# Patient Record
Sex: Female | Born: 1945
Health system: Southern US, Community
[De-identification: ages and names within clinical notes are randomized; demographics above are authoritative.]

## PROBLEM LIST (undated history)

## (undated) DIAGNOSIS — R002 Palpitations: Secondary | ICD-10-CM

## (undated) DIAGNOSIS — I2510 Atherosclerotic heart disease of native coronary artery without angina pectoris: Secondary | ICD-10-CM

## (undated) DIAGNOSIS — I10 Essential (primary) hypertension: Secondary | ICD-10-CM

## (undated) DIAGNOSIS — K649 Unspecified hemorrhoids: Secondary | ICD-10-CM

## (undated) DIAGNOSIS — K5790 Diverticulosis of intestine, part unspecified, without perforation or abscess without bleeding: Secondary | ICD-10-CM

## (undated) DIAGNOSIS — R011 Cardiac murmur, unspecified: Secondary | ICD-10-CM

## (undated) DIAGNOSIS — F329 Major depressive disorder, single episode, unspecified: Secondary | ICD-10-CM

## (undated) DIAGNOSIS — F32A Depression, unspecified: Secondary | ICD-10-CM

## (undated) DIAGNOSIS — K5909 Other constipation: Secondary | ICD-10-CM

## (undated) DIAGNOSIS — F419 Anxiety disorder, unspecified: Secondary | ICD-10-CM

## (undated) DIAGNOSIS — C50919 Malignant neoplasm of unspecified site of unspecified female breast: Secondary | ICD-10-CM

## (undated) DIAGNOSIS — E785 Hyperlipidemia, unspecified: Secondary | ICD-10-CM

## (undated) DIAGNOSIS — I6521 Occlusion and stenosis of right carotid artery: Secondary | ICD-10-CM

## (undated) DIAGNOSIS — R55 Syncope and collapse: Secondary | ICD-10-CM

## (undated) DIAGNOSIS — J45909 Unspecified asthma, uncomplicated: Secondary | ICD-10-CM

## (undated) DIAGNOSIS — Z923 Personal history of irradiation: Secondary | ICD-10-CM

## (undated) HISTORY — PX: KNEE ARTHROSCOPY: SUR90

## (undated) HISTORY — DX: Essential (primary) hypertension: I10

## (undated) HISTORY — DX: Syncope and collapse: R55

## (undated) HISTORY — DX: Malignant neoplasm of unspecified site of unspecified female breast: C50.919

## (undated) HISTORY — PX: CHOLECYSTECTOMY: SHX55

## (undated) HISTORY — PX: ABDOMINAL HYSTERECTOMY: SHX81

## (undated) HISTORY — DX: Palpitations: R00.2

## (undated) HISTORY — DX: Hyperlipidemia, unspecified: E78.5

## (undated) HISTORY — DX: Major depressive disorder, single episode, unspecified: F32.9

## (undated) HISTORY — DX: Depression, unspecified: F32.A

## (undated) HISTORY — PX: MASTECTOMY: SHX3

## (undated) HISTORY — PX: TUBAL LIGATION: SHX77

## (undated) HISTORY — DX: Atherosclerotic heart disease of native coronary artery without angina pectoris: I25.10

## (undated) HISTORY — DX: Cardiac murmur, unspecified: R01.1

## (undated) HISTORY — DX: Anxiety disorder, unspecified: F41.9

---

## 1998-01-04 ENCOUNTER — Other Ambulatory Visit: Admission: RE | Admit: 1998-01-04 | Discharge: 1998-01-04 | Payer: Self-pay | Admitting: *Deleted

## 1999-01-20 ENCOUNTER — Other Ambulatory Visit: Admission: RE | Admit: 1999-01-20 | Discharge: 1999-01-20 | Payer: Self-pay | Admitting: *Deleted

## 2000-01-21 ENCOUNTER — Other Ambulatory Visit: Admission: RE | Admit: 2000-01-21 | Discharge: 2000-01-21 | Payer: Self-pay | Admitting: *Deleted

## 2000-03-01 ENCOUNTER — Encounter: Admission: RE | Admit: 2000-03-01 | Discharge: 2000-03-01 | Payer: Self-pay | Admitting: *Deleted

## 2000-03-01 ENCOUNTER — Encounter: Payer: Self-pay | Admitting: *Deleted

## 2001-03-07 ENCOUNTER — Encounter: Admission: RE | Admit: 2001-03-07 | Discharge: 2001-03-07 | Payer: Self-pay | Admitting: *Deleted

## 2001-03-07 ENCOUNTER — Encounter: Payer: Self-pay | Admitting: *Deleted

## 2004-05-07 ENCOUNTER — Encounter: Admission: RE | Admit: 2004-05-07 | Discharge: 2004-05-07 | Payer: Self-pay | Admitting: Family Medicine

## 2005-09-16 ENCOUNTER — Encounter: Admission: RE | Admit: 2005-09-16 | Discharge: 2005-09-16 | Payer: Self-pay | Admitting: Family Medicine

## 2007-01-20 ENCOUNTER — Encounter: Admission: RE | Admit: 2007-01-20 | Discharge: 2007-01-20 | Payer: Self-pay | Admitting: Family Medicine

## 2007-05-15 ENCOUNTER — Emergency Department: Payer: Self-pay | Admitting: Emergency Medicine

## 2008-02-07 ENCOUNTER — Encounter: Admission: RE | Admit: 2008-02-07 | Discharge: 2008-02-07 | Payer: Self-pay | Admitting: Family Medicine

## 2008-02-15 ENCOUNTER — Encounter: Admission: RE | Admit: 2008-02-15 | Discharge: 2008-02-15 | Payer: Self-pay | Admitting: Family Medicine

## 2008-08-13 ENCOUNTER — Encounter: Admission: RE | Admit: 2008-08-13 | Discharge: 2008-08-13 | Payer: Self-pay | Admitting: Family Medicine

## 2009-02-11 ENCOUNTER — Encounter: Admission: RE | Admit: 2009-02-11 | Discharge: 2009-02-11 | Payer: Self-pay | Admitting: Internal Medicine

## 2009-07-05 ENCOUNTER — Encounter: Payer: Self-pay | Admitting: Gastroenterology

## 2009-07-16 ENCOUNTER — Encounter: Admission: RE | Admit: 2009-07-16 | Discharge: 2009-07-16 | Payer: Self-pay | Admitting: Internal Medicine

## 2009-12-19 ENCOUNTER — Telehealth (INDEPENDENT_AMBULATORY_CARE_PROVIDER_SITE_OTHER): Payer: Self-pay | Admitting: *Deleted

## 2010-02-28 ENCOUNTER — Telehealth (INDEPENDENT_AMBULATORY_CARE_PROVIDER_SITE_OTHER): Payer: Self-pay | Admitting: *Deleted

## 2010-03-10 ENCOUNTER — Telehealth: Payer: Self-pay | Admitting: Gastroenterology

## 2010-03-10 ENCOUNTER — Encounter (INDEPENDENT_AMBULATORY_CARE_PROVIDER_SITE_OTHER): Payer: Self-pay | Admitting: *Deleted

## 2010-05-04 ENCOUNTER — Encounter: Payer: Self-pay | Admitting: Internal Medicine

## 2010-05-05 ENCOUNTER — Encounter: Payer: Self-pay | Admitting: Internal Medicine

## 2010-05-05 ENCOUNTER — Encounter (INDEPENDENT_AMBULATORY_CARE_PROVIDER_SITE_OTHER): Payer: Self-pay

## 2010-05-07 ENCOUNTER — Telehealth: Payer: Self-pay | Admitting: Gastroenterology

## 2010-05-07 ENCOUNTER — Ambulatory Visit
Admission: RE | Admit: 2010-05-07 | Discharge: 2010-05-07 | Payer: Self-pay | Source: Home / Self Care | Attending: Gastroenterology | Admitting: Gastroenterology

## 2010-05-13 NOTE — Letter (Signed)
Summary: Pre Visit Letter Revised  [facility]  [address] [place], [place] [address]   Phone: [phone]  Fax: [phone]        03/10/2010 MRN: [id number] [facility] [address] [place], [place]  [address]             Procedure Date:  04/23/2010   Welcome to the Gastroenterology Division at [facility].    You are scheduled to see a nurse for your pre-procedure visit on 04/08/2010 at  8:30am on the 3rd floor at [facility], [address]  We ask that you try to arrive at our office 15 minutes prior to your appointment time to allow for check-in.  Please take a minute to review the attached form.  If you answer "Yes" to one or more of the questions on the first page, we ask that you call the person listed at your earliest opportunity.  If you answer "No" to all of the questions, please complete the rest of the form and bring it to your appointment.    Your nurse visit will consist of discussing your medical and surgical history, your immediate family medical history, and your medications.   If you are unable to list all of your medications on the form, please bring the medication bottles to your appointment and we will list them.  We will need to be aware of both prescribed and over the counter drugs.  We will need to know exact dosage information as well.    Please be prepared to read and sign documents such as consent forms, a financial agreement, and acknowledgement forms.  If necessary, and with your consent, a friend or relative is welcome to sit-in on the nurse visit with you.  Please bring your insurance card so that we may make a copy of it.  If your insurance requires a referral to see a specialist, please bring your referral form from your primary care physician.  No co-pay is required for this nurse visit.     If you cannot keep your appointment, please call [phone] to cancel or reschedule prior to your appointment date.  This  allows [place] the opportunity to schedule an appointment for another patient in need of care.    Thank you for choosing [facility] for your medical needs.  We appreciate the opportunity to care for you.  Please visit [place] at our website  to learn more about our practice.  Sincerely, The Gastroenterology Division

## 2010-05-13 NOTE — Procedures (Signed)
Summary: [facility]  [facility]   Imported By: Lester [place] 03/14/2010 07:56:13  _____________________________________________________________________  External Attachment:    Type:   Image     Comment:   External Document

## 2010-05-13 NOTE — Progress Notes (Signed)
Summary: Patient wants to Change GI  Phone Note From Other Clinic   Caller: Malachi Bonds [phone] Summary of Call: Malachi Bonds faxed records from [facility], I called to find out why we recieved the records and she states that the patient had a attempted Colonoscopy at [facility] in May 2011 and they asked her to go back so they could attempt again and patient declined. Malachi Bonds said the patient "did not get enough Versed and felt everything" so she refused to go back there. I will hold on to the [facility] notes but advised Malachi Bonds that we can not make the patient an appt to see Dr. Jarold Motto until he has ALL of the [facility] records for review and once he has done that someone will call her with his decision to accept or decline patient. Malachi Bonds states that she will work on getting Dr. Jarold Motto the [place] records tomorrow.  Initial call taken by: Harlow Mares CMA Duncan Dull),  December 19, 2009 4:27 PM     Appended Document: Patient wants to Change GI I placed the records for [facility] in the trash it has been almost a month and she has never faxed the [facility] Records to our office.

## 2010-05-13 NOTE — Progress Notes (Signed)
  Phone Note Other Incoming   Request: Send information Summary of Call: Request received from [facility] (2 pages) forwarded to Dr. Jarold Motto.

## 2010-05-13 NOTE — Progress Notes (Signed)
Summary: Schedule Colonoscopy with [facility]  Phone Note Outgoing Call Call back at Home Phone [phone]   Call placed by: Harlow Mares CMA Duncan Dull),  March 10, 2010 4:56 PM Call placed to: Patient Summary of Call: called pt and per Dr. Jarold Motto patient is ok to schedule colon with [facility] i have scheduled her for 04/23/2010 and I have sent her records to medical records to be scanned in for our keeping, she had a incomplete colonoscopy 07/05/2009 with [facility] phone number [phone]  Initial call taken by: Harlow Mares CMA Duncan Dull),  March 10, 2010 4:57 PM

## 2010-05-14 HISTORY — PX: COLONOSCOPY: SHX174

## 2010-05-15 NOTE — Progress Notes (Signed)
Summary: prep not in pharmacy  Phone Note From Pharmacy   Caller: [facility]* Call For: Dr Jarold Motto  Summary of Call: pt is at the pharmacy to pick up her prep but it's not there, please resend it to [facility] Initial call taken by: Tawni Levy,  May 07, 2010 2:48 PM    New/Updated Medications: MOVIPREP 100 GM  SOLR (PEG-KCL-NACL-NASULF-NA ASC-C) As per prep instructions. Prescriptions: MOVIPREP 100 GM  SOLR (PEG-KCL-NACL-NASULF-NA ASC-C) As per prep instructions.  #1 x 0   Entered by:   Wyona Almas RN   Authorized by:   Mardella Layman MD [facility]   Signed by:   Wyona Almas RN on 05/07/2010   Method used:   Electronically to        [facility]* (retail)       [address]       [place], [place]  [address]       Ph: [phone]       Fax: [phone]   RxID:   [id number]

## 2010-05-15 NOTE — Miscellaneous (Signed)
Summary:  [facility]  Clinical Lists Changes  Medications: Added new medication of MOVIPREP 100 GM  SOLR (PEG-KCL-NACL-NASULF-NA ASC-C) Allergies: Added new allergy or adverse reaction of PCN Added new allergy or adverse reaction of CEFTIN Added new allergy or adverse reaction of SULFA Added new allergy or adverse reaction of ERYTHROMYCIN Added new allergy or adverse reaction of TETRACYCLINE Added new allergy or adverse reaction of DARVOCET Added new allergy or adverse reaction of MACRODANTIN Added new allergy or adverse reaction of FLOXIN Added new allergy or adverse reaction of CODEINE Observations: Added new observation of ALLERGY REV: Done (05/07/2010 8:57) Added new observation of NKA: F (05/07/2010 8:57)

## 2010-05-15 NOTE — Letter (Signed)
Summary: [facility] Instructions  [facility]  [address] [place], [place] [address]   Phone: [phone]  Fax: [phone]       Jamie Curry    10-22-1945    MRN: [id number]        Procedure Day Dorna Bloom:  Wednesday 05/21/2010     Arrival Time: 2:30 pm     Procedure Time: 3:30 pm     Location of Procedure:                    _x _  [facility] (4th Floor)                        PREPARATION FOR COLONOSCOPY WITH MOVIPREP   Starting 5 days prior to your procedure Friday 2/3 do not eat nuts, seeds, popcorn, corn, beans, peas,  salads, or any raw vegetables.  Do not take any fiber supplements (e.g. Metamucil, Citrucel, and Benefiber).  THE DAY BEFORE YOUR PROCEDURE         DATE: Tuesday 2/7  1.  Drink clear liquids the entire day-NO SOLID FOOD  2.  Do not drink anything colored red or purple.  Avoid juices with pulp.  No orange juice.  3.  Drink at least 64 oz. (8 glasses) of fluid/clear liquids during the day to prevent dehydration and help the prep work efficiently.  CLEAR LIQUIDS INCLUDE: Water Jello Ice Popsicles Tea (sugar ok, no milk/cream) Powdered fruit flavored drinks Coffee (sugar ok, no milk/cream) Gatorade Juice: apple, white grape, white cranberry  Lemonade Clear bullion, consomm, broth Carbonated beverages (any kind) Strained chicken noodle soup Hard Candy                             4.  In the morning, mix first dose of MoviPrep solution:    Empty 1 Pouch A and 1 Pouch B into the disposable container    Add lukewarm drinking water to the top line of the container. Mix to dissolve    Refrigerate (mixed solution should be used within 24 hrs)  5.  Begin drinking the prep at 5:00 p.m. The MoviPrep container is divided by 4 marks.   Every 15 minutes drink the solution down to the next mark (approximately 8 oz) until the full liter is complete.   6.  Follow completed prep with 16 oz of clear liquid of your choice (Nothing red  or purple).  Continue to drink clear liquids until bedtime.  7.  Before going to bed, mix second dose of MoviPrep solution:    Empty 1 Pouch A and 1 Pouch B into the disposable container    Add lukewarm drinking water to the top line of the container. Mix to dissolve    Refrigerate  THE DAY OF YOUR PROCEDURE      DATE: Wednesday 2/8  Beginning at 10:30 a.m. (5 hours before procedure):         1. Every 15 minutes, drink the solution down to the next mark (approx 8 oz) until the full liter is complete.  2. Follow completed prep with 16 oz. of clear liquid of your choice.    3. You may drink clear liquids until 1:30 pm (2 HOURS BEFORE PROCEDURE).   MEDICATION INSTRUCTIONS  Unless otherwise instructed, you should take regular prescription medications with a small sip of water   as early as possible the morning of your  procedure.           OTHER INSTRUCTIONS  You will need a responsible adult at least 66 years of age to accompany you and drive you home.   This person must remain in the waiting room during your procedure.  Wear loose fitting clothing that is easily removed.  Leave jewelry and other valuables at home.  However, you may wish to bring a book to read or  an iPod/MP3 player to listen to music as you wait for your procedure to start.  Remove all body piercing jewelry and leave at home.  Total time from sign-in until discharge is approximately 2-3 hours.  You should go home directly after your procedure and rest.  You can resume normal activities the  day after your procedure.  The day of your procedure you should not:   Drive   Make legal decisions   Operate machinery   Drink alcohol   Return to work  You will receive specific instructions about eating, activities and medications before you leave.    The above instructions have been reviewed and explained to me by   Doristine Church RN II  May 07, 2010 9:43 AM    I fully understand and can  verbalize these instructions _____________________________ Date _________

## 2010-05-21 ENCOUNTER — Encounter (AMBULATORY_SURGERY_CENTER): Payer: Medicare Other | Admitting: Gastroenterology

## 2010-05-21 ENCOUNTER — Other Ambulatory Visit: Payer: Self-pay | Admitting: Gastroenterology

## 2010-05-21 ENCOUNTER — Encounter: Payer: Self-pay | Admitting: Gastroenterology

## 2010-05-21 DIAGNOSIS — Z1211 Encounter for screening for malignant neoplasm of colon: Secondary | ICD-10-CM

## 2010-05-21 DIAGNOSIS — Z800 Family history of malignant neoplasm of digestive organs: Secondary | ICD-10-CM

## 2010-05-21 DIAGNOSIS — K5730 Diverticulosis of large intestine without perforation or abscess without bleeding: Secondary | ICD-10-CM

## 2010-05-29 NOTE — Procedures (Signed)
Summary: Colonoscopy   Colonoscopy  Procedure date:  05/21/2010  Findings:      Location:  [facility].    Procedures Next Due Date:    Colonoscopy: 05/2015 COLONOSCOPY PROCEDURE REPORT  PATIENT:  Jamie Curry, Jamie Curry  MR#:  [id number] BIRTHDATE:   August 04, 1945, 64 yrs. old   GENDER:   female ENDOSCOPIST:   Vania Rea. Jarold Motto, MD, [facility] REF. BY: Kari Baars, M.D. PROCEDURE DATE:  05/21/2010 PROCEDURE:  Higher-risk screening colonoscopy G0105   ASA CLASS:   Class II INDICATIONS: family history of colon cancer sister MEDICATIONS:    propofol (Diprivan) 200 mg IV  DESCRIPTION OF PROCEDURE:   After the risks benefits and alternatives of the procedure were thoroughly explained, informed consent was obtained.  Digital rectal exam was performed and revealed no abnormalities.   The LB 180AL [id number] endoscope was introduced through the anus and advanced to the cecum, which was identified by both the appendix and ileocecal valve, without limitations.  The quality of the prep was excellent, using MoviPrep.  The instrument was then slowly withdrawn as the colon was fully examined. <<PROCEDUREIMAGES>>        <<OLD IMAGES>>  FINDINGS:  Moderate diverticulosis was found in the sigmoid to descending colon segments.  Otherwise normal colonoscopy without other polyps, masses, vascular ectasias, or inflammatory changes.   Retroflexed views in the rectum revealed it was not tolerated by the patient. and no abnormalities.    The scope was then withdrawn from the patient and the procedure completed.  COMPLICATIONS:   None ENDOSCOPIC IMPRESSION:  1) Moderate diverticulosis in the sigmoid to descending colon segments  2) No polyps or cancers RECOMMENDATIONS:  1) high fiber diet  2) Given your significant family history of colon cancer, you should have a repeat colonoscopy in 5 years REPEAT EXAM:   No   _______________________________ Vania Rea. Jarold Motto, MD, [facility]  CC:

## 2010-06-18 ENCOUNTER — Inpatient Hospital Stay (HOSPITAL_COMMUNITY)
Admission: EM | Admit: 2010-06-18 | Discharge: 2010-06-20 | DRG: 313 | Disposition: A | Discharge status: Home / Self Care | Payer: Medicare Other | Attending: Internal Medicine | Admitting: Internal Medicine

## 2010-06-18 ENCOUNTER — Emergency Department (HOSPITAL_COMMUNITY): Payer: Medicare Other

## 2010-06-18 DIAGNOSIS — I498 Other specified cardiac arrhythmias: Secondary | ICD-10-CM | POA: Diagnosis present

## 2010-06-18 DIAGNOSIS — I951 Orthostatic hypotension: Secondary | ICD-10-CM | POA: Diagnosis present

## 2010-06-18 DIAGNOSIS — R079 Chest pain, unspecified: Principal | ICD-10-CM | POA: Diagnosis present

## 2010-06-18 DIAGNOSIS — E785 Hyperlipidemia, unspecified: Secondary | ICD-10-CM | POA: Diagnosis present

## 2010-06-18 DIAGNOSIS — E669 Obesity, unspecified: Secondary | ICD-10-CM | POA: Diagnosis present

## 2010-06-18 DIAGNOSIS — J45909 Unspecified asthma, uncomplicated: Secondary | ICD-10-CM | POA: Diagnosis present

## 2010-06-18 DIAGNOSIS — I10 Essential (primary) hypertension: Secondary | ICD-10-CM | POA: Diagnosis present

## 2010-06-18 DIAGNOSIS — Z882 Allergy status to sulfonamides status: Secondary | ICD-10-CM

## 2010-06-18 DIAGNOSIS — Z880 Allergy status to penicillin: Secondary | ICD-10-CM

## 2010-06-18 DIAGNOSIS — I2510 Atherosclerotic heart disease of native coronary artery without angina pectoris: Secondary | ICD-10-CM | POA: Diagnosis present

## 2010-06-18 DIAGNOSIS — F341 Dysthymic disorder: Secondary | ICD-10-CM | POA: Diagnosis present

## 2010-06-18 DIAGNOSIS — D649 Anemia, unspecified: Secondary | ICD-10-CM | POA: Diagnosis present

## 2010-06-18 LAB — COMPREHENSIVE METABOLIC PANEL
ALT: 17 U/L (ref 0–35)
BUN: 17 mg/dL (ref 6–23)
CO2: 26 mEq/L (ref 19–32)
Calcium: 9.5 mg/dL (ref 8.4–10.5)
Chloride: 105 mEq/L (ref 96–112)
Creatinine, Ser: 1.24 mg/dL — ABNORMAL HIGH (ref 0.4–1.2)
GFR calc Af Amer: 53 mL/min — ABNORMAL LOW (ref >60)
GFR calc non Af Amer: 43 mL/min — ABNORMAL LOW (ref >60)
Potassium: 4.3 mEq/L (ref 3.5–5.1)
Total Bilirubin: 0.7 mg/dL (ref 0.3–1.2)
Total Protein: 7.1 g/dL (ref 6.0–8.3)

## 2010-06-18 LAB — POCT CARDIAC MARKERS
Myoglobin, poc: 92.6 ng/mL (ref 12–200)
Myoglobin, poc: 110 ng/mL (ref 12–200)
Troponin i, poc: <0.05 ng/mL (ref 0.00–0.09)

## 2010-06-18 LAB — GLUCOSE, CAPILLARY

## 2010-06-18 LAB — POCT I-STAT, CHEM 8
Calcium, Ion: 1.03 mmol/L — ABNORMAL LOW (ref 1.12–1.32)
Chloride: 109 mEq/L (ref 96–112)
Hemoglobin: 14.6 g/dL (ref 12.0–15.0)
Potassium: 4.2 mEq/L (ref 3.5–5.1)

## 2010-06-18 LAB — CBC
Hemoglobin: 13.7 g/dL (ref 12.0–15.0)
MCH: 30.4 pg (ref 26.0–34.0)
RBC: 4.5 MIL/uL (ref 3.87–5.11)
RDW: 13.7 % (ref 11.5–15.5)
WBC: 11.4 10*3/uL — ABNORMAL HIGH (ref 4.0–10.5)

## 2010-06-18 LAB — DIFFERENTIAL
Basophils Relative: 1 % (ref 0–1)
Eosinophils Absolute: 0.2 10*3/uL (ref 0.0–0.7)
Eosinophils Relative: 1 % (ref 0–5)
Lymphs Abs: 2.2 10*3/uL (ref 0.7–4.0)
Monocytes Absolute: 0.6 10*3/uL (ref 0.1–1.0)
Monocytes Relative: 5 % (ref 3–12)
Neutrophils Relative %: 74 % (ref 43–77)

## 2010-06-18 LAB — CK TOTAL AND CKMB (NOT AT ARMC): CK, MB: 1.7 ng/mL (ref 0.3–4.0)

## 2010-06-18 LAB — PROTIME-INR: INR: 0.93 (ref 0.00–1.49)

## 2010-06-18 LAB — TROPONIN I: Troponin I: 0.02 ng/mL (ref 0.00–0.06)

## 2010-06-19 DIAGNOSIS — I517 Cardiomegaly: Secondary | ICD-10-CM

## 2010-06-19 DIAGNOSIS — R55 Syncope and collapse: Secondary | ICD-10-CM

## 2010-06-19 LAB — LIPID PANEL
Cholesterol: 236 mg/dL — ABNORMAL HIGH (ref 0–200)
Total CHOL/HDL Ratio: 3.4 RATIO
VLDL: 34 mg/dL (ref 0–40)

## 2010-06-19 LAB — CARDIAC PANEL(CRET KIN+CKTOT+MB+TROPI)
CK, MB: 1.3 ng/mL (ref 0.3–4.0)
Relative Index: INVALID (ref 0.0–2.5)
Relative Index: INVALID (ref 0.0–2.5)
Total CK: 51 U/L (ref 7–177)
Troponin I: 0.03 ng/mL (ref 0.00–0.06)
Troponin I: <0.01 ng/mL (ref 0.00–0.06)

## 2010-06-19 LAB — BASIC METABOLIC PANEL
Calcium: 8.6 mg/dL (ref 8.4–10.5)
Chloride: 101 mEq/L (ref 96–112)
Creatinine, Ser: 1.09 mg/dL (ref 0.4–1.2)
GFR calc Af Amer: >60 mL/min (ref >60)

## 2010-06-19 LAB — HEMOGLOBIN A1C: Mean Plasma Glucose: 126 mg/dL — ABNORMAL HIGH (ref <117)

## 2010-06-19 LAB — CBC
Hemoglobin: 12.1 g/dL (ref 12.0–15.0)
RBC: 3.94 MIL/uL (ref 3.87–5.11)
WBC: 9.1 10*3/uL (ref 4.0–10.5)

## 2010-06-19 LAB — GLUCOSE, CAPILLARY: Glucose-Capillary: 144 mg/dL — ABNORMAL HIGH (ref 70–99)

## 2010-06-19 LAB — TSH: TSH: 3.125 u[IU]/mL (ref 0.350–4.500)

## 2010-06-20 ENCOUNTER — Inpatient Hospital Stay (HOSPITAL_COMMUNITY): Payer: Medicare Other

## 2010-06-20 LAB — BASIC METABOLIC PANEL
BUN: 9 mg/dL (ref 6–23)
CO2: 22 mEq/L (ref 19–32)
Calcium: 8 mg/dL — ABNORMAL LOW (ref 8.4–10.5)
Creatinine, Ser: 0.75 mg/dL (ref 0.4–1.2)
GFR calc Af Amer: >60 mL/min (ref >60)

## 2010-06-20 LAB — CBC
MCH: 30 pg (ref 26.0–34.0)
MCH: 30.3 pg (ref 26.0–34.0)
MCHC: 32.5 g/dL (ref 30.0–36.0)
Platelets: 189 10*3/uL (ref 150–400)
Platelets: 206 10*3/uL (ref 150–400)
RBC: 3.63 MIL/uL — ABNORMAL LOW (ref 3.87–5.11)
RDW: 13.8 % (ref 11.5–15.5)
WBC: 6.4 10*3/uL (ref 4.0–10.5)

## 2010-06-20 LAB — IRON AND TIBC
Iron: 34 ug/dL — ABNORMAL LOW (ref 42–135)
UIBC: 254 ug/dL

## 2010-06-20 MED ORDER — TECHNETIUM TC 99M TETROFOSMIN IV KIT
10.0000 | PACK | Freq: Once | INTRAVENOUS | Status: AC | PRN
  Administered 2010-06-20: 10 via INTRAVENOUS

## 2010-06-20 MED ORDER — TECHNETIUM TC 99M TETROFOSMIN IV KIT
30.0000 | PACK | Freq: Once | INTRAVENOUS | Status: AC | PRN
  Administered 2010-06-20: 30 via INTRAVENOUS

## 2010-06-21 ENCOUNTER — Other Ambulatory Visit (HOSPITAL_COMMUNITY): Payer: Medicare Other

## 2010-07-07 NOTE — Consult Note (Signed)
NAMEROSALAND, SHIFFMAN                ACCOUNT NO.:  [id number]  MEDICAL RECORD NO.:  [id number]           PATIENT TYPE:  I  LOCATION:  2038                         FACILITY:  [facility]  PHYSICIAN:  Cassell Clement, M.D. DATE OF BIRTH:  [birth year]  DATE OF CONSULTATION:  06/18/2010 DATE OF DISCHARGE:                                CONSULTATION   PRIMARY CARDIOLOGIST:  The patient reports Dr. Elease Hashimoto.  PATIENT PROFILE:  This is a 65 year old female with prior history of nonobstructive CAD who presents with chest tightness, slurred speech, and left arm numbness as well as nausea and vomiting.  PROBLEM LIST: 1. Chest pain.     a.     The patient has a history of catheterization in 2001 at      [facility] showing nonobstructive disease.     b.     Postcatheterization, the patient has a large hematoma      keeping her out of work for approximately 6 weeks. 2. Hypertension. 3. Hyperlipidemia. 4. Asthma. 5. Left-sided numbness and slurred speech today with negative head CT     in the ER. 6. Depression. 7. Anxiety. 8. History of cardiac murmur. 9. Obesity. 10.Status post partial hysterectomy secondary to uterine fibroids in     1982. 11.Status post cholecystectomy in 1996.  ALLERGIES: 1. ZITHROMAX. 2. BIAXIN. 3. CEFTIN. 4. PENICILLIN. 5. TETRACYCLINE. 6. SULFA. 7. CODEINE plus report of 3 additional antibiotics that the patient     does not know spelling.  HISTORY OF PRESENT ILLNESS:  This is a 65 year old female with history of nonobstructive CAD by catheterization in 2001 performed at [facility]. Last week, she noted feeling fullness in her head with elevated blood pressures in 140s-150s.  She saw Dr. Clelia Croft earlier this week and was noted to be bradycardic with rates in the 40s.  Her beta-blocker was discontinued and she was placed on lisinopril 10 mg daily.  Since the switch, blood pressure has been well controlled in the 110-120 range. This morning, she felt lightheaded all  morning long and while fixing lunch she became acutely presyncopal, diaphoretic, and also noted substernal chest tightness and dyspnea.  She called her primary care provider and was noted to have slurring of her speech.  She also developed left arm numbness and weakness.  She then called 911.  When EMS arrived, she was treated with supple nitroglycerin and aspirin with almost complete relief of chest pain and dyspnea, although hand numbness persisted.  She was taken to [facility] ED and was initially called as a code stroke.  Head CT showed no acute findings and all neurologic symptoms resolved.  Since being here, she has been noted to be in sinus rhythm on the monitor and ECG shows no acute changes.  Point-of-care markers negative x2.  She has had recurrent nausea with diaphoresis and vomiting, though this occurred while she was in the bathroom and also monitor.  Currently, she has no complaints.  CURRENT MEDICATIONS: 1. Aspirin 325 mg daily. 2. Lovenox 40 mg daily. 3. Lovastatin 10 mg nightly. 4. Zoloft 50 mg daily. 5. Lisinopril 10 mg daily  FAMILY HISTORY:  Mother died of an MI in her 75s, she was also diabetic. Father died at 66 with cancer.  She has a sister with colon cancer and lymphoma.  Brother died with intracranial hemorrhage at 84 and another brother who died with history of diabetes at 76.  Sister died 51 of cirrhosis and another brother with atrial fibrillation.  SOCIAL HISTORY:  The patient lives in [place] with her husband.  She is retired.  She denies tobacco, alcohol, or drug use.  She is not routinely exercising.  REVIEW OF SYSTEMS:  Positive for chest pain, dyspnea, presyncope, nausea, vomiting, and diaphoresis.  She is a full code.  Otherwise, all system are reviewed and are negative.  PHYSICAL EXAMINATION:  VITAL SIGNS:  Temperature 97.2, heart rate 84, respirations 16, blood pressure 101/58, and pulse ox 98% on room air. GENERAL:  A pleasant white  female in no acute distress.  Awake, alert, and oriented x3.  She has a  normal affect. HEENT:  Normal. NEURO:  Grossly intact and nonfocal. SKIN:  Warm and dry without lesions or masses. NECK:  Supple and obese, difficult to assess JVP.  There is question of bruit on the left versus radiated murmur. LUNGS:  Respirations are regular and unlabored.  Clear to auscultation. CARDIAC:  Regular S1 and S2 with 2/6 systolic murmur at the right upper sternal border. ABDOMEN:  Obese and soft.  Diffusely tender.  Bowel sounds present x4. EXTREMITIES:  Warm, dry, and pink.  No clubbing, cyanosis, or edema. Dorsalis pedis and posterior tibial pulses are 2+ and equal bilaterally.  Head CT showed no acute findings.  EKG shows sinus rhythm, rate of 73, likely left atrial enlargement, lateral T-flattening.  Hemoglobin 13.7, hematocrit 41.4, WBC 11.4, and platelets 268.  Sodium 136, potassium 4.2, chloride 109, CO2 of 21, BUN 21, creatinine 1.3, and glucose 117. CK-MB 1.4 and troponin I less than 0.5.  INR 0.93+.  ASSESSMENT AND PLAN: 1. Chest pain:  The patient without objective evidence of ischemia at     this time.  I agree with cycling of enzymes and evaluation of 2-D     echocardiogram.  Risk factors include hypertension, hyperlipidemia,     and obesity.  The patient also has reported history of     nonobstructive disease in 2001.  If enzymes negative, we would     probably plan on inpatient versus outpatient Myoview.     Understandably, the patient prefers to avoid catheterization given     prior history of groin bleeding and significant disability as     result of that for about 6 weeks.. 2. Nausea, vomiting, and weakness:  There is some question as whether     or not the bradycardia was playing a role in her weakness earlier     this week.  She is no longer bradycardic.  Continue to follow on     the monitor. 3. Bradycardia:  As above. 4. Hypertension:  Stable. 5. Systolic murmur:  Followup  2-D echocardiogram.     Nicolasa Ducking, ANP   ______________________________ Cassell Clement, M.D.    CB/MEDQ  D:  06/18/2010  T:  06/19/2010  Job:  [id number]  Electronically Signed by Nicolasa Ducking ANP on 06/30/2010 12:04:45 PM Electronically Signed by Cassell Clement M.D. on 07/07/2010 12:29:51 PM

## 2010-07-30 NOTE — H&P (Signed)
Jamie Curry, Jamie Curry                ACCOUNT NO.:  [id number]  MEDICAL RECORD NO.:  [id number]           PATIENT TYPE:  I  LOCATION:  2038                         FACILITY:  [facility]  PHYSICIAN:  Kari Baars, M.D.  DATE OF BIRTH:  23-Feb-1946  DATE OF ADMISSION:  06/18/2010 DATE OF DISCHARGE:                             HISTORY & PHYSICAL   CHIEF COMPLAINT:  Chest discomfort and left arm weakness.  HISTORY OF PRESENT ILLNESS:  Jamie Curry is a 65 year old white female with a history of hypertension, hyperlipidemia, and nonobstructive coronary artery disease, who presented to the emergency department with complaint of chest discomfort and left arm weakness.  She states her symptoms began around 12:30 this afternoon while she was making lunch. She was standing and became lightheaded and developed some upper chest tightness and squeezing.  She subsequently developed some diaphoresis, shortness of breath, and left arm weakness.  She also states that her speech may have been slurred.  She called our office and was recommended to call EMS.  They arrived and gave her some nitroglycerin which seemed to help with her chest discomfort.  Her left arm weakness persisted for about an hour and a half.  She was brought to the emergency department as a "code stroke" but was evaluated by Neurology and felt to have an NIH stroke score of 0.  Therefore, we were called for further evaluation.  The patient was actually seen in our office 2 days ago due to some episodes of lightheadedness.  She describes several episodes where she felt lightheaded and weak.  She felt like this may be related to a recent change from her Toprol-XL to metoprolol short acting.  While transitioning from her long-acting Toprol to short-acting metoprolol, she held her dose for 18 hours and then resumed short-acting metoprolol. After taking that, she had a profound episode of lightheadedness..  She checked her heart rate and it  was in the 40s with a blood pressure in the 153/63.  She called our office and was seen the following day, at which time her metoprolol was changed to lisinopril due to the bradycardia.  She states that she felt fine yesterday and her blood pressure was good.  She did have some mild headache and head congestion but that seemed to be improving.  Today when she awoke, she had some mild abdominal bloating and discomfort but otherwise felt fine until this episode began.  Currently, she is without any specific complaints and states that her chest discomfort and arm weakness have resolved.  REVIEW OF SYSTEMS:  All systems were reviewed with the patient and are negative except in the HPI.  PAST MEDICAL HISTORY: 1. Hypertension. 2. Hyperlipidemia. 3. Nonobstructive coronary artery disease (cardiac catheterization at     [facility] in 2001), complicated by postprocedure groin     leak. 4. Asthma. 5. Depression/anxiety. 6. Heart murmur with a normal echocardiogram in May 2010. 7. Obesity.  PAST SURGICAL HISTORY: 1. Status post partial hysterectomy secondary to fibroids (1982). 2. Status post cholecystectomy (1996).  SOCIAL HISTORY:  She has been married 4 times.  She has no children  but does have 3 stepchildren.  She has a high school education.  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  No tobacco, alcohol, or drug use.  FAMILY HISTORY:  Father died of oral cancer.  He was a smoker.  Mother died of an MI in her 69s and had diabetes.  She had a sister with colon cancer, sister died at 67 with an intracranial hemorrhage, and a sister with cirrhosis who died at 35.  She had a brother who had an MI and died at 55 and another brother with atrial fibrillation.  CURRENT MEDICATIONS: 1. Lovastatin 10 mg at bedtime. 2. Premarin 0.625 mg daily. 3. Sertraline 100 mg 1/2 daily. 4. Lisinopril 20 mg daily - started on June 16, 2010, in place of     metoprolol. 5. Flonase  daily.  ALLERGIES: 1. SULFA. 2. CODEINE. 3. TETRACYCLINE. 4. DARVOCET. 5. FLOXIN. 6. PENICILLIN. 7. CEFTIN.  PHYSICAL EXAMINATION:  VITAL SIGNS:  Temperature 97.2, blood pressure 106-125 over 59-73, pulse 66-80, oxygen saturation 100% on room air. GENERAL:  Pleasant obese female, in no acute distress. HEENT:  Pupils equal, round, and reactive to light.  Extraocular movements intact.  Oropharynx is moist. NECK:  Supple without lymphadenopathy, JVD, or carotid bruits. HEART:  Regular rate and rhythm without murmurs, rubs, or gallops. LUNGS:  Clear to auscultation bilaterally. ABDOMEN:  Soft, nondistended, nontender with normoactive bowel sounds. EXTREMITIES:  No clubbing, cyanosis, or edema. NEUROLOGIC:  Motor strength is 5/5 in all extremities.  Sensation grossly intact.  STUDIES: 1. CT of the head shows no acute intracranial abnormalities. 2. EKG personally reviewed shows sinus rhythm with PACs.  Nonspecific     ST changes.  ASSESSMENT/PLAN: 1. Chest pain - she does have some features which are concerning for     angina including her chest discomfort, diaphoresis, and left arm     numbness which may be related.  She has multiple cardiac risk     factors including hypertension, hyperlipidemia, obesity, and family     history of premature coronary artery disease.  Her EKGs and enzymes     are negative at this point.  She will be admitted to telemetry to     rule out myocardial infarction.  Cardiology has been consulted and     will see her in the emergency department.  She is currently chest     pain free, so will hold nitroglycerin and heparin at this point and     continue an aspirin therapy.  Check fasting lipid profile and     hemoglobin A1c to help with risk stratification. 2. Near syncope - this may be related to #1.  Alternatively, it may be     the cause of her chest discomfort.  We will obtain carotid     Dopplers, echocardiogram, and check orthostatic blood  pressures and     heart rates.  We will rule out MI and monitor on telemetry.  Her CT     of the brain is negative, so we will hold off on any additional     neurologic imaging, she has recurrent symptoms.  Less likely this     could be related to her blood pressure regimen with the recent     initiation of lisinopril, although I feel that her episodes may     have preceded the change while she was on beta-blocker therapy may     have been a harbinger to this event. 3. Hypertension - we will decrease her  lisinopril to 10 mg daily.     Hold beta-blocker secondary to bradycardia unless her heart rate     increases significantly or we find significant coronary disease. 4. Hyperlipidemia - continue lovastatin.  Obtain a fasting lipid     profile. 5. Disposition - anticipate discharge in 1-2 days if she is stable and     workup unrevealing.     Kari Baars, M.D.     WS/MEDQ  D:  06/18/2010  T:  06/19/2010  Job:  [id number]  cc:   Vesta Mixer, M.D.  Electronically Signed by Lacretia Nicks. Buren Kos M.D. on 07/30/2010 02:39:00 PM

## 2010-07-30 NOTE — H&P (Signed)
  NAMEFILICIA, SCOGIN                ACCOUNT NO.:  [id number]  MEDICAL RECORD NO.:  [id number]           PATIENT TYPE:  I  LOCATION:  2038                         FACILITY:  [facility]  PHYSICIAN:  Kari Baars, M.D.  DATE OF BIRTH:  [birth year]  DATE OF ADMISSION:  06/18/2010 DATE OF DISCHARGE:                             HISTORY & PHYSICAL   CHIEF COMPLAINT:  Chest discomfort and left arm weakness.  HISTORY OF PRESENT ILLNESS:  Ms. Xu is a 65 year old white female with a history of hypertension, hyperlipidemia, and nonobstructive coronary artery disease (2001), who presented to the emergency department with a complaint of chest discomfort and left arm weakness. She states that the onset of her symptoms as around 12:30 this afternoon while she was making lunch.  She was standing and felt lightheaded with some upper chest tightness and squeezing.  She subsequently developed diaphoresis and left arm weakness and numbness.  She also had some shortness of breath.  She was concerned that she may have had some slurred speech.  She called our office, and due to the left arm weakness and slurred speech she was referred to the emergency room by EMS.  She received nitroglycerin from EMS and states that her chest discomfort improved.  Her left arm weakness resolved after about an hour and a half and has not recurred.  The patient was actually seen in our office 2 days ago due to some episodes of lightheadedness over the past week. She at that time described several episodes where she felt lightheaded and weak.  The most profound episode was over the weekend when she had taken her first dose of metoprolol which was replacing Toprol-XL due to formulary change.  At that time, she noted her heart rate was in the 40s and her blood pressure remained high at 157/43.  Due to these symptoms, she was seen in the office and her metoprolol was changed to lisinopril due to bradycardia.  It was felt that  her lightheadedness at that time may represent some PVCs.  Dictation ended at this point.     Kari Baars, M.D.     WS/MEDQ  D:  06/18/2010  T:  06/19/2010  Job:  [id number]  cc:   Vesta Mixer, M.D.  Electronically Signed by Lacretia Nicks. Buren Kos M.D. on 07/30/2010 02:38:54 PM

## 2010-08-07 NOTE — Discharge Summary (Signed)
Jamie, Curry                ACCOUNT NO.:  [id number]  MEDICAL RECORD NO.:  [id number]           PATIENT TYPE:  I  LOCATION:  2038                         FACILITY:  [facility]  PHYSICIAN:  Kari Baars, M.D.  DATE OF BIRTH:  08-Jul-1945  DATE OF ADMISSION:  06/18/2010 DATE OF DISCHARGE:  06/20/2010                              DISCHARGE SUMMARY   DISCHARGE DIAGNOSES: 1. Near syncope secondary to significant orthostasis. 2. Atypical chest pain. 3. Hypertension. 4. Hyperlipidemia. 5. Anemia. 6. Carotid stenosis, moderate. 7. Nonobstructive coronary artery disease (cardiac catheterization at     [facility] in 2001), complicated by post procedure groin     leak. 8. Asthma. 9. Depression/anxiety. 10.Heart murmur. 11.Obesity. 12.Status post partial hysterectomy secondary to fibroids (1982). 13.Status post cholecystectomy.  DISCHARGE MEDICATIONS: 1. Aspirin 81 mg daily. 2. Dyphylline 200 mg t.i.d. p.r.n. 3. Flonase 1-2 sprays in each nostril daily. 4. Lovastatin 10 mg nightly. 5. Premarin 0.625 p.o. daily. 6. Sertraline 100 mg one-half daily. 7. She was instructed to stop her lisinopril 20 mg daily.  HOSPITAL PROCEDURES: 1. Transthoracic echocardiogram (June 19, 2010,) ejection fraction     60%.  No wall motion abnormalities.  Left atrium moderately     dilated. 2. Cardiac Lexiscan Myoview - negative for ischemia.  Ejection     fraction 83%. 3. Carotid Dopplers (June 19, 2010,) right internal carotid stenosis     of 40-59%.  Plaque morphology does not support the increase and is     probably related to tortuosity.  HISTORY OF PRESENT ILLNESS:  For full details, please see dictated history and physical.  Briefly, [name] is a 65 year old white female with a history of hypertension, hyperlipidemia, nonobstructive coronary artery disease who presented to the emergency department with complaint of chest discomfort and left arm weakness.  The patient states that  her symptoms began at about 12:30 on the day of admission while she was making lunch.  She stood up and became very lightheaded followed by some left upper chest tightness and squeezing.  She developed some diaphoresis, followed by left arm weakness.  She called our office and was recommended to come to the emergency department by EMS.  Upon arrival, she was given some nitroglycerin which seemed to help with her chest discomfort but her left arm weakness lasting for an hour and half. She was initially evaluated by Neurology and felt to have a low NIH stroke score of 0.  We were called for further evaluation.  Recent history includes a recent episode of lightheadedness after changing her Toprol-XL to short-acting metoprolol.  She states that she had an episode of profound episode of lightheadedness.  She checked her heart rate and it was in the 40s with a blood pressure of 153/63.  She called our office and was seen the following day, at which time her metoprolol was changed to lisinopril due to the bradycardia.  She initially felt fine and her blood pressure appeared to be improved; however, on the morning of admission, she developed some mid abdominal bloating and discomfort followed by this episode.  In the emergency department, her  blood pressure was 106/59 with pulse of 66.  EKG shows normal sinus rhythm with PACs and nonspecific ST changes.  Cardiac enzymes were negative.  CT of the head showed no acute intracranial abnormalities.  Given her symptoms, she was admitted for further management.  HOSPITAL COURSE:  The patient was admitted to a telemetry bed.  She ruled out for myocardial infarction with serial cardiac enzymes. Cardiology consult was obtained due to her chest discomfort.  She was seen by [facility] who recommended a Lexiscan Myoview which was performed prior to discharge and was negative.  She did have orthostatic blood pressures obtained which showed a  profound drop in her blood pressure from 106/75 lying to 65/43 standing with associated symptoms. She was hydrated for this consistent with improvement consistent with volume depletion.  She had no further episodes of chest pain throughout the remainder of her hospitalization.  Her antihypertensives were held. At the time of discharge, her blood pressure was stable though trending upwards.  She was instructed to continue to monitor her blood pressure and to call if it was above 170/100.  It is likely she will need antihypertensive therapy; however, given her orthostasis, we will be cautious with restarting it at this point.  DISCHARGE INSTRUCTIONS:  She was instructed to monitor her blood pressure and to call if she has any recurrent symptoms including dizziness or recurrent chest pain.  DISCHARGE DIET:  Cardiac prudent.  HOSPITAL FOLLOWUP:  She will follow up with Dr. Clelia Croft within the next week at [facility] and will follow up with Dr. Elease Hashimoto as needed.  CONDITION ON DISCHARGE:  Improved.     Kari Baars, M.D.     WS/MEDQ  D:  07/30/2010  T:  07/31/2010  Job:  [id number]  cc:   Vesta Mixer, M.D.  Electronically Signed by Lacretia Nicks. Buren Kos M.D. on 08/07/2010 08:48:47 AM

## 2010-09-02 ENCOUNTER — Other Ambulatory Visit: Payer: Self-pay | Admitting: Internal Medicine

## 2010-09-02 DIAGNOSIS — Z1231 Encounter for screening mammogram for malignant neoplasm of breast: Secondary | ICD-10-CM

## 2010-09-17 ENCOUNTER — Ambulatory Visit
Admission: RE | Admit: 2010-09-17 | Discharge: 2010-09-17 | Disposition: A | Discharge status: Home / Self Care | Payer: Medicare Other | Source: Ambulatory Visit | Attending: Internal Medicine | Admitting: Internal Medicine

## 2010-09-17 DIAGNOSIS — Z1231 Encounter for screening mammogram for malignant neoplasm of breast: Secondary | ICD-10-CM

## 2010-09-19 ENCOUNTER — Other Ambulatory Visit: Payer: Self-pay | Admitting: Internal Medicine

## 2010-09-19 DIAGNOSIS — R928 Other abnormal and inconclusive findings on diagnostic imaging of breast: Secondary | ICD-10-CM

## 2010-09-23 ENCOUNTER — Ambulatory Visit
Admission: RE | Admit: 2010-09-23 | Discharge: 2010-09-23 | Disposition: A | Discharge status: Home / Self Care | Payer: Medicare Other | Source: Ambulatory Visit | Attending: Internal Medicine | Admitting: Internal Medicine

## 2010-09-23 DIAGNOSIS — R928 Other abnormal and inconclusive findings on diagnostic imaging of breast: Secondary | ICD-10-CM

## 2010-12-29 ENCOUNTER — Telehealth: Payer: Self-pay | Admitting: Cardiovascular Disease

## 2010-12-29 ENCOUNTER — Encounter: Payer: Self-pay | Admitting: Cardiovascular Disease

## 2010-12-29 ENCOUNTER — Encounter: Payer: Medicare Other | Admitting: *Deleted

## 2010-12-29 ENCOUNTER — Ambulatory Visit (INDEPENDENT_AMBULATORY_CARE_PROVIDER_SITE_OTHER): Payer: Medicare Other | Admitting: Cardiovascular Disease

## 2010-12-29 DIAGNOSIS — R002 Palpitations: Secondary | ICD-10-CM

## 2010-12-29 DIAGNOSIS — I10 Essential (primary) hypertension: Secondary | ICD-10-CM

## 2010-12-29 NOTE — Assessment & Plan Note (Addendum)
She's having some palpitations but I think her premature ventricular contractions.   Her heart rate is already very slow. It's difficult to know if we would be able to give her any additional medications. Hopefully we will not on any significant, serious arrhythmias.   We'll give her 30 day event monitor.

## 2010-12-29 NOTE — Progress Notes (Signed)
Jamie Curry Date of Birth  1945-06-15 [facility] [address]    [address] [place], [place]  [address] [phone]  Fax  [phone]  History of Present Illness:  65 year old female with history of hypertension, presyncope, hyperlipidemia and nonobstructive coronary artery disease. She was admitted in March with chest discomfort and some left arm weakness. She had an echocardiogram which revealed normal left ventricular systolic function. She had a stress Myoview study which was normal.  Since that time she's felt a little bit better but has continued to have some episodes of lightheadedness thought to be due to orthostatic hypotension. She also has been having some palpitations in her chest.  Her heart rate has been running slow in Dr. Clelia Croft has been trying to adjust her blood pressure medications such that she will not have so much bradycardia.  Current Outpatient Prescriptions  Medication Sig Dispense Refill  . amLODipine (NORVASC) 5 MG tablet Take 5 mg by mouth daily.        Marland Kitchen aspirin 81 MG tablet Take 81 mg by mouth daily.        Marland Kitchen estrogens, conjugated, (PREMARIN) 0.625 MG tablet Take 0.625 mg by mouth daily. Take daily for 21 days then do not take for 7 days.       Marland Kitchen ezetimibe-simvastatin (VYTORIN) 10-20 MG per tablet Take 1 tablet by mouth at bedtime.        Marland Kitchen FLUTICASONE PROPIONATE, NASAL, NA Place 50 mcg into the nose. Taking Two Sprays in each nose          Allergies  Allergen Reactions  . Cefuroxime Axetil   . Codeine   . Erythromycin   . Nitrofurantoin   . Ofloxacin   . Penicillins     REACTION: severe anaphylaxis  . Propoxyphene N-Acetaminophen   . Sulfonamide Derivatives   . Tetracycline     Past Medical History  Diagnosis Date  . HTN (hypertension)   . Syncope     Pre-syncope  . CAD (coronary artery disease)     mild CAD by cath in 2001  . Hyperlipidemia     No past surgical history on file.  History  Smoking status  . Never Smoker     Smokeless tobacco  . Not on file    History  Alcohol Use No    No family history on file.  Reviw of Systems:  Reviewed in the HPI.  All other systems are negative.  Physical Exam: BP 160/78  Pulse 60  Ht 5\' 4"  (1.626 m)  Wt 191 lb (86.637 kg)  BMI 32.79 kg/m2 The patient is alert and oriented x 3.  The mood and affect are normal.   Skin: warm and dry.  Color is normal.    HEENT:   the sclera are nonicteric.  The mucous membranes are moist.  The carotids are 2+ without bruits.  There is no thyromegaly.  There is no JVD.    Lungs: clear.  The chest wall is non tender.    Heart: regular rate with a normal S1 and S2.  There are no murmurs, gallops, or rubs. The PMI is not displaced.     Abdomen: good bowel sounds.  There is no guarding or rebound.  There is no hepatosplenomegaly or tenderness.  There are no masses.   Extremities:  no clubbing, cyanosis, or edema.  The legs are without rashes.  The distal pulses are intact.   Neuro:  Cranial nerves II - XII are intact.  Motor and sensory functions are intact.    The gait is normal.  ECG: Sinus bradycardia. She has no ST or T wave changes.  Assessment / Plan:

## 2010-12-29 NOTE — Telephone Encounter (Signed)
Pt needs an event monitor 30 day for irre. Heartrate.  Please call Malachi Bonds back with appointment date and time as soon as possible.

## 2010-12-29 NOTE — Assessment & Plan Note (Addendum)
She has a history of hypertension but also has significant episodes of orthostatic hypotension. She still eats will do salt. I've encouraged her to stay in her same medications. Encouraged her to exercise on regular basis and to stay well hydrated.

## 2010-12-29 NOTE — Telephone Encounter (Signed)
App made for today ' 

## 2011-02-09 ENCOUNTER — Telehealth: Payer: Self-pay | Admitting: Cardiovascular Disease

## 2011-02-09 NOTE — Telephone Encounter (Signed)
Normal result given from ecardio event monitor, Dr Clelia Croft wanted due to irreg heart beat described by pt. Pt to keep f/u with dr Elease Hashimoto, pt verbalized understanding.

## 2011-02-09 NOTE — Telephone Encounter (Signed)
New message:  Would like to results of monitor.  Mailed in about 2 weeks ago.

## 2011-03-30 ENCOUNTER — Ambulatory Visit: Payer: Medicare Other | Admitting: Cardiovascular Disease

## 2011-10-27 ENCOUNTER — Other Ambulatory Visit: Payer: Self-pay | Admitting: Internal Medicine

## 2011-10-27 DIAGNOSIS — Z1231 Encounter for screening mammogram for malignant neoplasm of breast: Secondary | ICD-10-CM

## 2011-11-12 ENCOUNTER — Ambulatory Visit (INDEPENDENT_AMBULATORY_CARE_PROVIDER_SITE_OTHER): Payer: Medicare Other | Admitting: Cardiovascular Disease

## 2011-11-12 ENCOUNTER — Ambulatory Visit
Admission: RE | Admit: 2011-11-12 | Discharge: 2011-11-12 | Disposition: A | Discharge status: Home / Self Care | Payer: Medicare Other | Source: Ambulatory Visit | Attending: Internal Medicine | Admitting: Internal Medicine

## 2011-11-12 ENCOUNTER — Encounter: Payer: Self-pay | Admitting: Cardiovascular Disease

## 2011-11-12 VITALS — BP 130/67 | HR 53 | Ht 64.0 in | Wt 193.1 lb

## 2011-11-12 DIAGNOSIS — E785 Hyperlipidemia, unspecified: Secondary | ICD-10-CM

## 2011-11-12 DIAGNOSIS — R002 Palpitations: Secondary | ICD-10-CM

## 2011-11-12 DIAGNOSIS — I10 Essential (primary) hypertension: Secondary | ICD-10-CM

## 2011-11-12 DIAGNOSIS — Z1231 Encounter for screening mammogram for malignant neoplasm of breast: Secondary | ICD-10-CM

## 2011-11-12 NOTE — Assessment & Plan Note (Signed)
Jamie Curry's  blood pressure is well controlled. I've asked her to continue to exercise much possible to exercise 4-5 times a week.

## 2011-11-12 NOTE — Assessment & Plan Note (Signed)
Her cholesterol  levels were mildly elevated. We'll recheck her cholesterol levels next week or so.

## 2011-11-12 NOTE — Patient Instructions (Addendum)
Your physician wants you to follow-up in: 6 months  You will receive a reminder letter in the mail two months in advance. If you don't receive a letter, please call our office to schedule the follow-up appointment.   Your physician recommends that you return for a FASTING lipid profile: next week can drink water and have black coffee

## 2011-11-12 NOTE — Progress Notes (Signed)
Jamie Curry Date of Birth  22-Jun-1945       [facility] Office [address]  [address] [place], [place]  [address]   [place], [place]  [address] [phone]     [phone]   Fax  [phone]    Fax [phone]  Problem List: 1. Hypertension 2. Presyncope-  3. Hyperlipidemia 4. Mild coronary artery disease- she was admitted to the hospital in March, 2012 with chest pain. An echocardiogram was normal. A stress Myoview study was normal.  History of Present Illness: 66 year old female with history of hypertension, presyncope, hyperlipidemia and nonobstructive coronary artery disease. She was admitted in March with chest discomfort and some left arm weakness. She had an echocardiogram which revealed normal left ventricular systolic function. She had a stress Myoview study which was normal.  Genitalia monitor which did not reveal any significant dysrhythmias.  She is doing well .  Getting only a little exercise.   Current Outpatient Prescriptions on File Prior to Visit  Medication Sig Dispense Refill  . amLODipine (NORVASC) 5 MG tablet Take 5 mg by mouth daily.        Marland Kitchen aspirin 81 MG tablet Take 81 mg by mouth daily.        Marland Kitchen estrogens, conjugated, (PREMARIN) 0.625 MG tablet Take 0.625 mg by mouth daily. Take daily for 21 days then do not take for 7 days.       Marland Kitchen ezetimibe-simvastatin (VYTORIN) 10-20 MG per tablet Take 1 tablet by mouth at bedtime.        Marland Kitchen FLUTICASONE PROPIONATE, NASAL, NA Place 50 mcg into the nose. Taking Two Sprays in each nose       . montelukast (SINGULAIR) 10 MG tablet Take 10 mg by mouth at bedtime.      . sertraline (ZOLOFT) 50 MG tablet Take 50 mg by mouth daily.        Allergies  Allergen Reactions  . Cefuroxime Axetil   . Codeine   . Erythromycin   . Nitrofurantoin   . Ofloxacin   . Penicillins     REACTION: severe anaphylaxis  . Propoxyphene-Acetaminophen   . Sulfonamide Derivatives     . Tetracycline     Past Medical History  Diagnosis Date  . HTN (hypertension)   . Syncope     Pre-syncope  . CAD (coronary artery disease)     mild CAD by cath in 2001  . Hyperlipidemia   . Palpitations     No past surgical history on file.  History  Smoking status  . Never Smoker   Smokeless tobacco  . Not on file    History  Alcohol Use No    No family history on file.  Reviw of Systems:  Reviewed in the HPI.  All other systems are negative.  Physical Exam: Blood pressure 130/67, pulse 53, height 5\' 4"  (1.626 m), weight 193 lb 1.9 oz (87.599 kg). General: Well developed, well nourished, in no acute distress.  Head: Normocephalic, atraumatic, sclera non-icteric, mucus membranes are moist,   Neck: Supple. Carotids are 2 + without bruits. No JVD  Lungs: Clear bilaterally to auscultation.  Heart: regular rate.  normal  S1 S2. There is a 2/6 systolic murmur.  Abdomen: Soft, non-tender, non-distended with normal bowel sounds. No hepatomegaly. No rebound/guarding. No masses.  Msk:  Strength and tone are normal  Extremities: No clubbing or cyanosis. No edema.  Distal pedal pulses are 2+  and equal bilaterally.  Neuro: Alert and oriented X 3. Moves all extremities spontaneously.  Psych:  Responds to questions appropriately with a normal affect.  ECG: 11/12/2011-sinus bradycardia at 52 beats a minute. She has nonspecific ST and T wave changes.  Assessment / Plan:

## 2011-11-17 ENCOUNTER — Other Ambulatory Visit (INDEPENDENT_AMBULATORY_CARE_PROVIDER_SITE_OTHER): Payer: Medicare Other

## 2011-11-17 DIAGNOSIS — E785 Hyperlipidemia, unspecified: Secondary | ICD-10-CM

## 2011-11-17 DIAGNOSIS — I10 Essential (primary) hypertension: Secondary | ICD-10-CM

## 2011-11-17 LAB — BASIC METABOLIC PANEL
BUN: 17 mg/dL (ref 6–23)
CO2: 27 mEq/L (ref 19–32)
Calcium: 9 mg/dL (ref 8.4–10.5)
Chloride: 102 mEq/L (ref 96–112)
Creatinine, Ser: 0.7 mg/dL (ref 0.4–1.2)

## 2011-11-17 LAB — LIPID PANEL
Cholesterol: 256 mg/dL — ABNORMAL HIGH (ref 0–200)
Total CHOL/HDL Ratio: 3

## 2011-11-17 LAB — HEPATIC FUNCTION PANEL
ALT: 16 U/L (ref 0–35)
AST: 19 U/L (ref 0–37)
Alkaline Phosphatase: 71 U/L (ref 39–117)
Total Bilirubin: 1.1 mg/dL (ref 0.3–1.2)

## 2011-11-19 ENCOUNTER — Telehealth: Payer: Self-pay | Admitting: Cardiovascular Disease

## 2011-11-19 ENCOUNTER — Telehealth: Payer: Self-pay | Admitting: *Deleted

## 2011-11-19 DIAGNOSIS — E785 Hyperlipidemia, unspecified: Secondary | ICD-10-CM

## 2011-11-19 MED ORDER — ATORVASTATIN CALCIUM 40 MG PO TABS: 40.0000 mg | ORAL_TABLET | Freq: Every day | ORAL | Status: DC

## 2011-11-19 NOTE — Progress Notes (Signed)
Pt not at home, i will try later.

## 2011-11-19 NOTE — Telephone Encounter (Signed)
PT FEARFUL THAT ATORVASTATIN WILL RAISE SUGAR, SHE SPOKE WITH PHARMACIST AND THEY SAID IT WASN'T PROVEN, PT WILL START MED.

## 2011-11-19 NOTE — Telephone Encounter (Signed)
Message copied by Antony Odea on Thu Nov 19, 2011  1:59 PM ------      Message from: Vesta Mixer      Created: Wed Nov 18, 2011  6:10 PM       Cholesterol is very elevated.  She needs to work on a good diet and exercise program.  Stop Vytorin. Start Atorvastatin 40 mg a day.  Recheck labs in 3 months

## 2011-11-19 NOTE — Telephone Encounter (Signed)
Patient returning nurse call, she can be reached at [phone]

## 2011-11-19 NOTE — Telephone Encounter (Signed)
Abs given/ meds changed/ lab date set for 3 months

## 2012-02-24 ENCOUNTER — Other Ambulatory Visit: Payer: Medicare Other

## 2012-04-05 ENCOUNTER — Encounter: Payer: Self-pay | Admitting: Cardiovascular Disease

## 2012-11-14 ENCOUNTER — Other Ambulatory Visit: Payer: Self-pay

## 2012-11-14 DIAGNOSIS — Z1231 Encounter for screening mammogram for malignant neoplasm of breast: Secondary | ICD-10-CM

## 2012-12-02 ENCOUNTER — Ambulatory Visit: Payer: Medicare Other

## 2013-01-03 ENCOUNTER — Ambulatory Visit
Admission: RE | Admit: 2013-01-03 | Discharge: 2013-01-03 | Disposition: A | Discharge status: Home / Self Care | Payer: Medicare Other | Source: Ambulatory Visit

## 2013-01-03 DIAGNOSIS — Z1231 Encounter for screening mammogram for malignant neoplasm of breast: Secondary | ICD-10-CM

## 2013-02-01 ENCOUNTER — Ambulatory Visit: Payer: Medicare Other | Admitting: Cardiovascular Disease

## 2013-08-02 ENCOUNTER — Other Ambulatory Visit (HOSPITAL_COMMUNITY): Payer: Self-pay | Admitting: Internal Medicine

## 2013-08-02 ENCOUNTER — Ambulatory Visit (HOSPITAL_COMMUNITY)
Admission: RE | Admit: 2013-08-02 | Discharge: 2013-08-02 | Disposition: A | Discharge status: Home / Self Care | Payer: Medicare HMO | Source: Ambulatory Visit | Attending: Vascular Surgery | Admitting: Vascular Surgery

## 2013-08-02 DIAGNOSIS — I6529 Occlusion and stenosis of unspecified carotid artery: Secondary | ICD-10-CM | POA: Insufficient documentation

## 2013-08-02 DIAGNOSIS — I6521 Occlusion and stenosis of right carotid artery: Secondary | ICD-10-CM

## 2013-12-07 ENCOUNTER — Other Ambulatory Visit: Payer: Self-pay

## 2013-12-07 DIAGNOSIS — Z1231 Encounter for screening mammogram for malignant neoplasm of breast: Secondary | ICD-10-CM

## 2014-01-04 ENCOUNTER — Ambulatory Visit
Admission: RE | Admit: 2014-01-04 | Discharge: 2014-01-04 | Disposition: A | Discharge status: Home / Self Care | Payer: Commercial Managed Care - HMO | Source: Ambulatory Visit

## 2014-01-04 ENCOUNTER — Encounter (INDEPENDENT_AMBULATORY_CARE_PROVIDER_SITE_OTHER): Payer: Self-pay

## 2014-01-04 DIAGNOSIS — Z1231 Encounter for screening mammogram for malignant neoplasm of breast: Secondary | ICD-10-CM

## 2014-04-20 ENCOUNTER — Ambulatory Visit (INDEPENDENT_AMBULATORY_CARE_PROVIDER_SITE_OTHER): Payer: Commercial Managed Care - HMO | Admitting: Podiatry

## 2014-04-20 ENCOUNTER — Ambulatory Visit (INDEPENDENT_AMBULATORY_CARE_PROVIDER_SITE_OTHER): Payer: Commercial Managed Care - HMO

## 2014-04-20 ENCOUNTER — Encounter: Payer: Self-pay | Admitting: Podiatry

## 2014-04-20 VITALS — BP 130/70 | HR 55 | Resp 16 | Ht 64.0 in | Wt 194.0 lb

## 2014-04-20 DIAGNOSIS — M7662 Achilles tendinitis, left leg: Secondary | ICD-10-CM

## 2014-04-20 DIAGNOSIS — M722 Plantar fascial fibromatosis: Secondary | ICD-10-CM | POA: Diagnosis not present

## 2014-04-20 MED ORDER — TRIAMCINOLONE ACETONIDE 10 MG/ML IJ SUSP
10.0000 mg | Freq: Once | INTRAMUSCULAR | Status: AC
  Administered 2014-04-20: 10 mg

## 2014-04-20 NOTE — Progress Notes (Signed)
   Subjective:    Patient ID: Jamie Curry, female    DOB: Oct 01, 1945, 69 y.o.   MRN: [id number]  HPI Comments: The left foot was hurting on the side (medial) and on the heel. It had been hurting for a while. It got worse but now i have no pain. It did hurt to walk. i took some tylenol and stayed off of it. i had shockwave by dr Paulla Dolly in [place] back 7 + yrs ago.   Foot Pain      Review of Systems  Cardiovascular: Positive for palpitations and leg swelling.  Musculoskeletal: Positive for back pain.  All other systems reviewed and are negative.      Objective:   Physical Exam        Assessment & Plan:

## 2014-04-20 NOTE — Patient Instructions (Signed)

## 2014-04-22 NOTE — Progress Notes (Signed)
Subjective:     Patient ID: Jamie Curry, female   DOB: June 20, 1945, 69 y.o.   MRN: [id number]  HPI patient states that had a lot of problems with my left heel over the last few months and it seems to hurt on the bottom and then some on the back. It's been doing well the last 7 years since I had shockwave therapy but I have had reoccurrence   Review of Systems  All other systems reviewed and are negative.      Objective:   Physical Exam  Constitutional: She is oriented to person, place, and time.  Cardiovascular: Intact distal pulses.   Musculoskeletal: Normal range of motion.  Neurological: She is oriented to person, place, and time.  Skin: Skin is warm and dry.  Nursing note and vitals reviewed.  neurovascular status intact muscle strength adequate with range of motion subtalar midtarsal joint within normal limits. Patient has good digital perfusion and is noted to be well oriented and does have discomfort to the plantar aspect left heel upon palpation and mild discomfort to the posterior left heel medial side. I did not note any tendon dysfunction     Assessment:     Combination of plantar fasciitis along with posterior Achilles tendinitis which is most likely compensatory in nature due to plantar pain    Plan:     Reviewed both conditions and did plantar injection left 3 mg Kenalog 5 mg Xylocaine and instructed on physical therapy. Patient will be seen back for [place] to recheck

## 2014-04-27 ENCOUNTER — Ambulatory Visit (INDEPENDENT_AMBULATORY_CARE_PROVIDER_SITE_OTHER): Payer: Commercial Managed Care - HMO | Admitting: Podiatry

## 2014-04-27 VITALS — BP 137/73 | HR 59 | Resp 16

## 2014-04-27 DIAGNOSIS — M7662 Achilles tendinitis, left leg: Secondary | ICD-10-CM | POA: Diagnosis not present

## 2014-04-27 DIAGNOSIS — M722 Plantar fascial fibromatosis: Secondary | ICD-10-CM | POA: Diagnosis not present

## 2014-04-27 NOTE — Progress Notes (Signed)
Subjective:     Patient ID: Jamie Curry, female   DOB: 1946/03/06, 69 y.o.   MRN: [id number]  HPI patient states my foot is feeling a lot better left with occasional discomfort after exercise   Review of Systems     Objective:   Physical Exam Neurovascular status intact with minimal discomfort plantar and posterior aspect left heel with improvement that occurred but still mild pain when pressed    Assessment:     Plantar fasciitis Achilles tendinitis left which has improved but is still present    Plan:     Advised on physical therapy anti-inflammatories and supportive shoe. Patient's discharged and less symptoms were to get worse

## 2014-05-09 DIAGNOSIS — Z6833 Body mass index (BMI) 33.0-33.9, adult: Secondary | ICD-10-CM | POA: Diagnosis not present

## 2014-05-09 DIAGNOSIS — J309 Allergic rhinitis, unspecified: Secondary | ICD-10-CM | POA: Diagnosis not present

## 2014-05-09 DIAGNOSIS — I10 Essential (primary) hypertension: Secondary | ICD-10-CM | POA: Diagnosis not present

## 2014-05-09 DIAGNOSIS — R05 Cough: Secondary | ICD-10-CM | POA: Diagnosis not present

## 2014-05-11 DIAGNOSIS — H1130 Conjunctival hemorrhage, unspecified eye: Secondary | ICD-10-CM | POA: Diagnosis not present

## 2014-05-29 DIAGNOSIS — H5210 Myopia, unspecified eye: Secondary | ICD-10-CM | POA: Diagnosis not present

## 2014-05-29 DIAGNOSIS — H524 Presbyopia: Secondary | ICD-10-CM | POA: Diagnosis not present

## 2014-05-29 DIAGNOSIS — H2513 Age-related nuclear cataract, bilateral: Secondary | ICD-10-CM | POA: Diagnosis not present

## 2014-06-14 DIAGNOSIS — Z0100 Encounter for examination of eyes and vision without abnormal findings: Secondary | ICD-10-CM | POA: Diagnosis not present

## 2014-06-27 DIAGNOSIS — R21 Rash and other nonspecific skin eruption: Secondary | ICD-10-CM | POA: Diagnosis not present

## 2014-06-27 DIAGNOSIS — L821 Other seborrheic keratosis: Secondary | ICD-10-CM | POA: Diagnosis not present

## 2014-06-27 DIAGNOSIS — L820 Inflamed seborrheic keratosis: Secondary | ICD-10-CM | POA: Diagnosis not present

## 2014-06-27 DIAGNOSIS — Z85828 Personal history of other malignant neoplasm of skin: Secondary | ICD-10-CM | POA: Diagnosis not present

## 2014-07-25 DIAGNOSIS — M545 Low back pain: Secondary | ICD-10-CM | POA: Diagnosis not present

## 2014-07-25 DIAGNOSIS — E782 Mixed hyperlipidemia: Secondary | ICD-10-CM | POA: Diagnosis not present

## 2014-07-25 DIAGNOSIS — I10 Essential (primary) hypertension: Secondary | ICD-10-CM | POA: Diagnosis not present

## 2014-07-25 DIAGNOSIS — J45909 Unspecified asthma, uncomplicated: Secondary | ICD-10-CM | POA: Diagnosis not present

## 2014-07-25 DIAGNOSIS — R7301 Impaired fasting glucose: Secondary | ICD-10-CM | POA: Diagnosis not present

## 2014-07-25 DIAGNOSIS — I2510 Atherosclerotic heart disease of native coronary artery without angina pectoris: Secondary | ICD-10-CM | POA: Diagnosis not present

## 2014-07-25 DIAGNOSIS — I6521 Occlusion and stenosis of right carotid artery: Secondary | ICD-10-CM | POA: Diagnosis not present

## 2014-07-25 DIAGNOSIS — R002 Palpitations: Secondary | ICD-10-CM | POA: Diagnosis not present

## 2014-08-27 DIAGNOSIS — M25561 Pain in right knee: Secondary | ICD-10-CM | POA: Diagnosis not present

## 2014-08-27 DIAGNOSIS — M5441 Lumbago with sciatica, right side: Secondary | ICD-10-CM | POA: Diagnosis not present

## 2014-08-29 DIAGNOSIS — M545 Low back pain: Secondary | ICD-10-CM | POA: Diagnosis not present

## 2014-08-29 DIAGNOSIS — M25561 Pain in right knee: Secondary | ICD-10-CM | POA: Diagnosis not present

## 2014-08-31 DIAGNOSIS — M25561 Pain in right knee: Secondary | ICD-10-CM | POA: Diagnosis not present

## 2014-08-31 DIAGNOSIS — M545 Low back pain: Secondary | ICD-10-CM | POA: Diagnosis not present

## 2014-09-04 DIAGNOSIS — M545 Low back pain: Secondary | ICD-10-CM | POA: Diagnosis not present

## 2014-09-04 DIAGNOSIS — M25561 Pain in right knee: Secondary | ICD-10-CM | POA: Diagnosis not present

## 2014-09-06 DIAGNOSIS — M545 Low back pain: Secondary | ICD-10-CM | POA: Diagnosis not present

## 2014-09-06 DIAGNOSIS — M25561 Pain in right knee: Secondary | ICD-10-CM | POA: Diagnosis not present

## 2014-09-11 DIAGNOSIS — M25561 Pain in right knee: Secondary | ICD-10-CM | POA: Diagnosis not present

## 2014-09-11 DIAGNOSIS — M545 Low back pain: Secondary | ICD-10-CM | POA: Diagnosis not present

## 2014-09-13 DIAGNOSIS — M25561 Pain in right knee: Secondary | ICD-10-CM | POA: Diagnosis not present

## 2014-09-13 DIAGNOSIS — M545 Low back pain: Secondary | ICD-10-CM | POA: Diagnosis not present

## 2014-09-18 DIAGNOSIS — M25561 Pain in right knee: Secondary | ICD-10-CM | POA: Diagnosis not present

## 2014-09-18 DIAGNOSIS — M545 Low back pain: Secondary | ICD-10-CM | POA: Diagnosis not present

## 2014-09-20 DIAGNOSIS — M25561 Pain in right knee: Secondary | ICD-10-CM | POA: Diagnosis not present

## 2014-09-20 DIAGNOSIS — M545 Low back pain: Secondary | ICD-10-CM | POA: Diagnosis not present

## 2014-09-24 DIAGNOSIS — M5441 Lumbago with sciatica, right side: Secondary | ICD-10-CM | POA: Diagnosis not present

## 2014-09-24 DIAGNOSIS — M25561 Pain in right knee: Secondary | ICD-10-CM | POA: Diagnosis not present

## 2014-12-25 ENCOUNTER — Other Ambulatory Visit: Payer: Self-pay

## 2014-12-25 DIAGNOSIS — Z1231 Encounter for screening mammogram for malignant neoplasm of breast: Secondary | ICD-10-CM

## 2015-01-30 ENCOUNTER — Ambulatory Visit
Admission: RE | Admit: 2015-01-30 | Discharge: 2015-01-30 | Disposition: A | Discharge status: Home / Self Care | Payer: Commercial Managed Care - HMO | Source: Ambulatory Visit

## 2015-01-30 DIAGNOSIS — Z1231 Encounter for screening mammogram for malignant neoplasm of breast: Secondary | ICD-10-CM | POA: Diagnosis not present

## 2015-02-01 ENCOUNTER — Other Ambulatory Visit: Payer: Self-pay | Admitting: Internal Medicine

## 2015-02-01 DIAGNOSIS — R928 Other abnormal and inconclusive findings on diagnostic imaging of breast: Secondary | ICD-10-CM

## 2015-02-05 ENCOUNTER — Ambulatory Visit
Admission: RE | Admit: 2015-02-05 | Discharge: 2015-02-05 | Disposition: A | Discharge status: Home / Self Care | Payer: Commercial Managed Care - HMO | Source: Ambulatory Visit | Attending: Internal Medicine | Admitting: Internal Medicine

## 2015-02-05 DIAGNOSIS — R928 Other abnormal and inconclusive findings on diagnostic imaging of breast: Secondary | ICD-10-CM

## 2015-02-05 DIAGNOSIS — N6002 Solitary cyst of left breast: Secondary | ICD-10-CM | POA: Diagnosis not present

## 2015-02-18 DIAGNOSIS — M25562 Pain in left knee: Secondary | ICD-10-CM | POA: Diagnosis not present

## 2015-02-22 DIAGNOSIS — I10 Essential (primary) hypertension: Secondary | ICD-10-CM | POA: Diagnosis not present

## 2015-02-22 DIAGNOSIS — Z0000 Encounter for general adult medical examination without abnormal findings: Secondary | ICD-10-CM | POA: Diagnosis not present

## 2015-02-22 DIAGNOSIS — R7301 Impaired fasting glucose: Secondary | ICD-10-CM | POA: Diagnosis not present

## 2015-02-22 DIAGNOSIS — E782 Mixed hyperlipidemia: Secondary | ICD-10-CM | POA: Diagnosis not present

## 2015-02-27 DIAGNOSIS — I10 Essential (primary) hypertension: Secondary | ICD-10-CM | POA: Diagnosis not present

## 2015-02-27 DIAGNOSIS — I8390 Asymptomatic varicose veins of unspecified lower extremity: Secondary | ICD-10-CM | POA: Diagnosis not present

## 2015-02-27 DIAGNOSIS — Z0000 Encounter for general adult medical examination without abnormal findings: Secondary | ICD-10-CM | POA: Diagnosis not present

## 2015-02-27 DIAGNOSIS — R7301 Impaired fasting glucose: Secondary | ICD-10-CM | POA: Diagnosis not present

## 2015-02-27 DIAGNOSIS — E782 Mixed hyperlipidemia: Secondary | ICD-10-CM | POA: Diagnosis not present

## 2015-02-27 DIAGNOSIS — I2510 Atherosclerotic heart disease of native coronary artery without angina pectoris: Secondary | ICD-10-CM | POA: Diagnosis not present

## 2015-02-27 DIAGNOSIS — F3342 Major depressive disorder, recurrent, in full remission: Secondary | ICD-10-CM | POA: Diagnosis not present

## 2015-02-27 DIAGNOSIS — J45909 Unspecified asthma, uncomplicated: Secondary | ICD-10-CM | POA: Diagnosis not present

## 2015-03-05 DIAGNOSIS — Z1212 Encounter for screening for malignant neoplasm of rectum: Secondary | ICD-10-CM | POA: Diagnosis not present

## 2015-03-06 ENCOUNTER — Other Ambulatory Visit: Payer: Self-pay | Admitting: Physician Assistant

## 2015-03-06 DIAGNOSIS — M25562 Pain in left knee: Secondary | ICD-10-CM

## 2015-03-18 ENCOUNTER — Ambulatory Visit
Admission: RE | Admit: 2015-03-18 | Discharge: 2015-03-18 | Disposition: A | Discharge status: Home / Self Care | Payer: Commercial Managed Care - HMO | Source: Ambulatory Visit | Attending: Physician Assistant | Admitting: Physician Assistant

## 2015-03-18 DIAGNOSIS — M25562 Pain in left knee: Secondary | ICD-10-CM

## 2015-03-18 DIAGNOSIS — S83242A Other tear of medial meniscus, current injury, left knee, initial encounter: Secondary | ICD-10-CM | POA: Diagnosis not present

## 2015-03-20 DIAGNOSIS — M25562 Pain in left knee: Secondary | ICD-10-CM | POA: Diagnosis not present

## 2015-03-27 ENCOUNTER — Other Ambulatory Visit: Payer: Commercial Managed Care - HMO

## 2015-04-11 DIAGNOSIS — M25562 Pain in left knee: Secondary | ICD-10-CM | POA: Diagnosis not present

## 2015-05-06 DIAGNOSIS — M25562 Pain in left knee: Secondary | ICD-10-CM | POA: Diagnosis not present

## 2015-05-16 DIAGNOSIS — I8390 Asymptomatic varicose veins of unspecified lower extremity: Secondary | ICD-10-CM | POA: Diagnosis not present

## 2015-05-16 DIAGNOSIS — I10 Essential (primary) hypertension: Secondary | ICD-10-CM | POA: Diagnosis not present

## 2015-05-16 DIAGNOSIS — M79605 Pain in left leg: Secondary | ICD-10-CM | POA: Diagnosis not present

## 2015-05-16 DIAGNOSIS — Z6834 Body mass index (BMI) 34.0-34.9, adult: Secondary | ICD-10-CM | POA: Diagnosis not present

## 2015-05-16 DIAGNOSIS — M79604 Pain in right leg: Secondary | ICD-10-CM | POA: Diagnosis not present

## 2015-05-16 DIAGNOSIS — R29898 Other symptoms and signs involving the musculoskeletal system: Secondary | ICD-10-CM | POA: Diagnosis not present

## 2015-05-16 DIAGNOSIS — R600 Localized edema: Secondary | ICD-10-CM | POA: Diagnosis not present

## 2015-06-17 DIAGNOSIS — M25562 Pain in left knee: Secondary | ICD-10-CM | POA: Diagnosis not present

## 2015-06-17 DIAGNOSIS — M25561 Pain in right knee: Secondary | ICD-10-CM | POA: Diagnosis not present

## 2015-07-01 ENCOUNTER — Encounter: Payer: Self-pay | Admitting: Internal Medicine

## 2015-07-09 DIAGNOSIS — M25561 Pain in right knee: Secondary | ICD-10-CM | POA: Diagnosis not present

## 2015-07-18 DIAGNOSIS — M25461 Effusion, right knee: Secondary | ICD-10-CM | POA: Diagnosis not present

## 2015-07-18 DIAGNOSIS — M94261 Chondromalacia, right knee: Secondary | ICD-10-CM | POA: Diagnosis not present

## 2015-07-18 DIAGNOSIS — G8918 Other acute postprocedural pain: Secondary | ICD-10-CM | POA: Diagnosis not present

## 2015-07-18 DIAGNOSIS — M659 Synovitis and tenosynovitis, unspecified: Secondary | ICD-10-CM | POA: Diagnosis not present

## 2015-07-18 DIAGNOSIS — M23321 Other meniscus derangements, posterior horn of medial meniscus, right knee: Secondary | ICD-10-CM | POA: Diagnosis not present

## 2015-08-07 DIAGNOSIS — Z6833 Body mass index (BMI) 33.0-33.9, adult: Secondary | ICD-10-CM | POA: Diagnosis not present

## 2015-08-07 DIAGNOSIS — J029 Acute pharyngitis, unspecified: Secondary | ICD-10-CM | POA: Diagnosis not present

## 2015-08-07 DIAGNOSIS — R05 Cough: Secondary | ICD-10-CM | POA: Diagnosis not present

## 2015-08-07 DIAGNOSIS — J069 Acute upper respiratory infection, unspecified: Secondary | ICD-10-CM | POA: Diagnosis not present

## 2015-08-07 DIAGNOSIS — R509 Fever, unspecified: Secondary | ICD-10-CM | POA: Diagnosis not present

## 2015-08-22 DIAGNOSIS — M25561 Pain in right knee: Secondary | ICD-10-CM | POA: Diagnosis not present

## 2015-08-22 DIAGNOSIS — M23321 Other meniscus derangements, posterior horn of medial meniscus, right knee: Secondary | ICD-10-CM | POA: Diagnosis not present

## 2015-09-02 DIAGNOSIS — J45909 Unspecified asthma, uncomplicated: Secondary | ICD-10-CM | POA: Diagnosis not present

## 2015-09-02 DIAGNOSIS — R7301 Impaired fasting glucose: Secondary | ICD-10-CM | POA: Diagnosis not present

## 2015-09-02 DIAGNOSIS — Z6833 Body mass index (BMI) 33.0-33.9, adult: Secondary | ICD-10-CM | POA: Diagnosis not present

## 2015-09-02 DIAGNOSIS — I6521 Occlusion and stenosis of right carotid artery: Secondary | ICD-10-CM | POA: Diagnosis not present

## 2015-09-02 DIAGNOSIS — I2510 Atherosclerotic heart disease of native coronary artery without angina pectoris: Secondary | ICD-10-CM | POA: Diagnosis not present

## 2015-09-02 DIAGNOSIS — F329 Major depressive disorder, single episode, unspecified: Secondary | ICD-10-CM | POA: Diagnosis not present

## 2015-09-02 DIAGNOSIS — I10 Essential (primary) hypertension: Secondary | ICD-10-CM | POA: Diagnosis not present

## 2015-09-02 DIAGNOSIS — E782 Mixed hyperlipidemia: Secondary | ICD-10-CM | POA: Diagnosis not present

## 2015-11-13 DIAGNOSIS — M25561 Pain in right knee: Secondary | ICD-10-CM | POA: Diagnosis not present

## 2015-11-13 DIAGNOSIS — M23321 Other meniscus derangements, posterior horn of medial meniscus, right knee: Secondary | ICD-10-CM | POA: Diagnosis not present

## 2015-12-17 DIAGNOSIS — L821 Other seborrheic keratosis: Secondary | ICD-10-CM | POA: Diagnosis not present

## 2015-12-17 DIAGNOSIS — D229 Melanocytic nevi, unspecified: Secondary | ICD-10-CM | POA: Diagnosis not present

## 2015-12-17 DIAGNOSIS — L820 Inflamed seborrheic keratosis: Secondary | ICD-10-CM | POA: Diagnosis not present

## 2015-12-17 DIAGNOSIS — Z85828 Personal history of other malignant neoplasm of skin: Secondary | ICD-10-CM | POA: Diagnosis not present

## 2016-01-06 ENCOUNTER — Other Ambulatory Visit: Payer: Self-pay | Admitting: Internal Medicine

## 2016-01-06 DIAGNOSIS — Z1231 Encounter for screening mammogram for malignant neoplasm of breast: Secondary | ICD-10-CM

## 2016-01-07 DIAGNOSIS — Z23 Encounter for immunization: Secondary | ICD-10-CM | POA: Diagnosis not present

## 2016-02-03 ENCOUNTER — Ambulatory Visit
Admission: RE | Admit: 2016-02-03 | Discharge: 2016-02-03 | Disposition: A | Discharge status: Home / Self Care | Payer: PPO | Source: Ambulatory Visit | Attending: Internal Medicine | Admitting: Internal Medicine

## 2016-02-03 DIAGNOSIS — Z1231 Encounter for screening mammogram for malignant neoplasm of breast: Secondary | ICD-10-CM

## 2016-02-05 ENCOUNTER — Other Ambulatory Visit: Payer: Self-pay | Admitting: Internal Medicine

## 2016-02-05 DIAGNOSIS — R928 Other abnormal and inconclusive findings on diagnostic imaging of breast: Secondary | ICD-10-CM

## 2016-02-07 ENCOUNTER — Ambulatory Visit
Admission: RE | Admit: 2016-02-07 | Discharge: 2016-02-07 | Disposition: A | Discharge status: Home / Self Care | Payer: PPO | Source: Ambulatory Visit | Attending: Internal Medicine | Admitting: Internal Medicine

## 2016-02-07 ENCOUNTER — Other Ambulatory Visit: Payer: Self-pay | Admitting: Internal Medicine

## 2016-02-07 DIAGNOSIS — C50919 Malignant neoplasm of unspecified site of unspecified female breast: Secondary | ICD-10-CM

## 2016-02-07 DIAGNOSIS — R928 Other abnormal and inconclusive findings on diagnostic imaging of breast: Secondary | ICD-10-CM

## 2016-02-07 DIAGNOSIS — N6320 Unspecified lump in the left breast, unspecified quadrant: Secondary | ICD-10-CM | POA: Diagnosis not present

## 2016-02-07 DIAGNOSIS — C50412 Malignant neoplasm of upper-outer quadrant of left female breast: Secondary | ICD-10-CM | POA: Diagnosis not present

## 2016-02-07 DIAGNOSIS — N6321 Unspecified lump in the left breast, upper outer quadrant: Secondary | ICD-10-CM | POA: Diagnosis not present

## 2016-02-07 HISTORY — PX: BREAST BIOPSY: SHX20

## 2016-02-07 HISTORY — DX: Malignant neoplasm of unspecified site of unspecified female breast: C50.919

## 2016-02-10 ENCOUNTER — Other Ambulatory Visit: Payer: Self-pay | Admitting: Internal Medicine

## 2016-02-10 DIAGNOSIS — N6002 Solitary cyst of left breast: Secondary | ICD-10-CM

## 2016-02-13 ENCOUNTER — Ambulatory Visit
Admission: RE | Admit: 2016-02-13 | Discharge: 2016-02-13 | Disposition: A | Discharge status: Home / Self Care | Payer: PPO | Source: Ambulatory Visit | Attending: Internal Medicine | Admitting: Internal Medicine

## 2016-02-13 ENCOUNTER — Other Ambulatory Visit: Payer: Self-pay | Admitting: Internal Medicine

## 2016-02-13 DIAGNOSIS — N6002 Solitary cyst of left breast: Secondary | ICD-10-CM

## 2016-02-13 HISTORY — PX: BREAST CYST ASPIRATION: SHX578

## 2016-02-14 ENCOUNTER — Encounter: Payer: Self-pay | Admitting: *Deleted

## 2016-02-14 ENCOUNTER — Encounter: Payer: Self-pay | Admitting: Hematology and Oncology

## 2016-02-14 ENCOUNTER — Inpatient Hospital Stay: Payer: PPO | Attending: Hematology and Oncology | Admitting: Hematology and Oncology

## 2016-02-14 ENCOUNTER — Encounter (INDEPENDENT_AMBULATORY_CARE_PROVIDER_SITE_OTHER): Payer: Self-pay

## 2016-02-14 VITALS — BP 146/78 | HR 61 | Temp 97.2°F | Resp 16 | Wt 188.4 lb

## 2016-02-14 DIAGNOSIS — F329 Major depressive disorder, single episode, unspecified: Secondary | ICD-10-CM | POA: Diagnosis not present

## 2016-02-14 DIAGNOSIS — C50412 Malignant neoplasm of upper-outer quadrant of left female breast: Secondary | ICD-10-CM | POA: Insufficient documentation

## 2016-02-14 DIAGNOSIS — F419 Anxiety disorder, unspecified: Secondary | ICD-10-CM | POA: Insufficient documentation

## 2016-02-14 DIAGNOSIS — Z79899 Other long term (current) drug therapy: Secondary | ICD-10-CM | POA: Diagnosis not present

## 2016-02-14 DIAGNOSIS — Z170 Estrogen receptor positive status [ER+]: Secondary | ICD-10-CM | POA: Diagnosis not present

## 2016-02-14 DIAGNOSIS — I2510 Atherosclerotic heart disease of native coronary artery without angina pectoris: Secondary | ICD-10-CM | POA: Insufficient documentation

## 2016-02-14 DIAGNOSIS — I10 Essential (primary) hypertension: Secondary | ICD-10-CM | POA: Insufficient documentation

## 2016-02-14 DIAGNOSIS — E785 Hyperlipidemia, unspecified: Secondary | ICD-10-CM | POA: Insufficient documentation

## 2016-02-14 NOTE — Progress Notes (Signed)
Met with patient and her husband today during her initial consult with Dr. Alvy Bimler.  Dr. Alvy Bimler has recommended surgical consultation for lumpectomy to Dr. Rochel Brome.  Called his office today to schedule her appointment, but the office was closed.  Will call Monday and give patient her appointment.  Gave patient breast cancer educational literature, "My Breast Cancer Treatment Handbook" by Josephine Igo, RN.  She is call with any questions or needs.  She is to let me know her surgical date, so I can coordinate her return appointment with Dr. Alvy Bimler.

## 2016-02-14 NOTE — Assessment & Plan Note (Signed)
We discussed the general approach and management of localized left breast cancer With the help of the navigator, I will set up general surgery consultation for local resection. I will defer the surgeon to discuss options with the patient The outcome for lumpectomy plus radiation therapy is almost as effective as mastectomy Depending on the final staging report, we will decide if the patient with require adjuvant systemic chemotherapy After she completes local therapy, she will benefit from long-term anti-estrogen therapy When I see her next time, we will discuss getting another baseline bone density scan before I prescribe anti-estrogen therapy

## 2016-02-14 NOTE — Progress Notes (Signed)
[facility] CONSULT NOTE  Patient Care Team: Marton Redwood, MD as PCP - General (Internal Medicine)  CHIEF COMPLAINTS/PURPOSE OF CONSULTATION:  Newly diagnosed left breast cancer  HISTORY OF PRESENTING ILLNESS:  Jamie Curry 70 y.o. female is here because of recent diagnosis of left breast cancer  The cancer was detected by screening mammogram. The cancer was not palpable prior to diagnosis but the patient is made aware of a persistent left breast cyst I had the opportunity to review her records extensively. We have records detailing her screening mammogram dated back to 2001 I reviewed her records extensively and collaborated the history with the patient. I summarize some of the critical major events as follows:  SUMMARY OF ONCOLOGIC HISTORY:   Breast cancer of upper-outer quadrant of left female breast ([place])   02/15/2008 Imaging    Small  probable mildly complicated cysts within the upper outer quadrant right breast at the 11 o'clock position in the region of questionable nodularity noted on mammography.  Recommend follow-up right breast diagnostic mammogram and ultrasound in 6 months.         08/13/2008 Imaging    Ultrasound is performed, showing stable small minimal complicated cysts at the right breast 11 o'clock position 4 cm from the nipple, not changed compared prior exam      09/23/2010 Imaging    1.3 cm simple cyst located within the left breast at the 12 o'clock position corresponding to the mammographic finding.  No findings worrisome for malignancy.  Recommend annual screening mammography      09/23/2010 Mammogram    1.3 cm simple cyst located within the left breast at the 12 o'clock position corresponding to the mammographic finding.  No findings worrisome for malignancy.       02/05/2015 Imaging    A 2.5 x 3.5 cm oval circumscribed mass within the upper retroareolar left breast is identified. No suspicious calcifications or distortion identified.  On  physical exam, a firm palpable mobile mass is identified at the 11:30 position of the left breast 4 cm from the nipple. Targeted ultrasound is performed, showing a 2.8 x 1.4 x 3.6 cm simple cyst at the 11:30 position of the left breast, corresponding to the screening study finding.      02/07/2016 Imaging    Targeted ultrasound of the left breast was performed demonstrating an irregular shadowing hypoechoic mass at 1 o'clock 5 cm from nipple measuring approximately 2.3 x 1 x 1.6 cm. This corresponds well with mammography findings. No lymphadenopathy seen in the left axilla.      02/07/2016 Mammogram    Note that the patient has a stable oval circumscribed mass in the retroareolar left breast previously characterized as a cyst. Although this cyst appears stable when compared to 2016 it is overall increasing in size and therefore given the patient's highly suspicious mass in the upper-outer left breast aspiration of the retroareolar left breast cyst is warranted.      02/07/2016 Procedure    She had US guided biopsy of breast mass      02/07/2016 Pathology Results    Accession: [id number]: Biopsy confirmed invasive breast cancer, ER 10% positive, PR 10% positive and Her2/neu negative, Ki 67 15%        In terms of breast cancer risk profile:  She menarched at early age of 25 and went to menopause at age 48 through surgical menopause  She had never been pregnant  She had taken birth control pills for approximately 36  years for menorhagia.  She was never exposed to fertility medications. After hysterectomy, she was placed on hormone replacement therapy for almost 34 years, stopped recently since her diagnosis.  She has no family history of Breast/GYN/GI cancer. Her sister, who is a patient of mine, had history of colon cancer and lymphoma  INTERVAL HISTORY: Currently, she is healing well after recent breast biopsy with mild discomfort. She has no concern for local infection Prior to the  biopsy, she has occasional tingling sensation over the area of the breast cyst  MEDICAL HISTORY:  Past Medical History:  Diagnosis Date  . Anxiety   . CAD (coronary artery disease)    mild CAD by cath in 2001  . Depression   . Heart murmur   . HTN (hypertension)   . Hyperlipidemia   . Palpitations   . Syncope    Pre-syncope    SURGICAL HISTORY: Past Surgical History:  Procedure Laterality Date  . ABDOMINAL HYSTERECTOMY    . BREAST BIOPSY    . CHOLECYSTECTOMY    . KNEE ARTHROSCOPY Right     SOCIAL HISTORY: Social History   Social History  . Marital status: Married    Spouse name: N/A  . Number of children: N/A  . Years of education: N/A   Occupational History  . Not on file.   Social History Main Topics  . Smoking status: Never Smoker  . Smokeless tobacco: Never Used  . Alcohol use No  . Drug use: No  . Sexual activity: Not on file   Other Topics Concern  . Not on file   Social History Narrative  . No narrative on file    FAMILY HISTORY: Family History  Problem Relation Age of Onset  . Cancer Father     oral cancer  . Cancer Sister     colon cancer and lymphoma    ALLERGIES:  is allergic to cefuroxime axetil; codeine; erythromycin; nitrofurantoin; ofloxacin; penicillins; propoxyphene n-acetaminophen; sulfonamide derivatives; and tetracycline.  MEDICATIONS:  Current Outpatient Prescriptions  Medication Sig Dispense Refill  . amLODipine (NORVASC) 5 MG tablet Take 5 mg by mouth daily.      Marland Kitchen FLUTICASONE PROPIONATE, NASAL, NA Place 50 mcg into the nose. Taking Two Sprays in each nose     . hydrochlorothiazide (HYDRODIURIL) 25 MG tablet Take 25 mg by mouth daily.    . montelukast (SINGULAIR) 10 MG tablet Take 10 mg by mouth at bedtime.    . sertraline (ZOLOFT) 50 MG tablet Take 50 mg by mouth daily.     No current facility-administered medications for this visit.     REVIEW OF SYSTEMS:   Constitutional: Denies fevers, chills or abnormal night  sweats Eyes: Denies blurriness of vision, double vision or watery eyes Ears, nose, mouth, throat, and face: Denies mucositis or sore throat Respiratory: Denies cough, dyspnea or wheezes Cardiovascular: Denies palpitation, chest discomfort or lower extremity swelling Gastrointestinal:  Denies nausea, heartburn or change in bowel habits Skin: Denies abnormal skin rashes Lymphatics: Denies new lymphadenopathy or easy bruising Neurological:Denies numbness, tingling or new weaknesses Behavioral/Psych: Mood is stable, no new changes  All other systems were reviewed with the patient and are negative.  PHYSICAL EXAMINATION: ECOG PERFORMANCE STATUS: 0 - Asymptomatic  Vitals:   02/14/16 1423  BP: (!) 146/78  Pulse: 61  Resp: 16  Temp: 97.2 F (36.2 C)   Filed Weights   02/14/16 1423  Weight: 188 lb 6.1 oz (85.5 kg)    GENERAL:alert, no distress and comfortable  SKIN: skin color, texture, turgor are normal, no rashes or significant lesions EYES: normal, conjunctiva are pink and non-injected, sclera clear OROPHARYNX:no exudate, no erythema and lips, buccal mucosa, and tongue normal  NECK: supple, thyroid normal size, non-tender, without nodularity LYMPH:  no palpable lymphadenopathy in the cervical, axillary or inguinal LUNGS: clear to auscultation and percussion with normal breathing effort HEART: regular rate & rhythm and no murmurs and no lower extremity edema ABDOMEN:abdomen soft, non-tender and normal bowel sounds Musculoskeletal:no cyanosis of digits and no clubbing  PSYCH: alert & oriented x 3 with fluent speech NEURO: no focal motor/sensory deficits Bilateral breast examination were performed. Normal breast exam on the right. On the left, there is a palpable lump around the 1:00 position on the left upper outer quadrant of the breast, nontender to touch There is no abnormal appearance of the nipple or overlying skin. Minor bruising is noted  LABORATORY DATA:  I have reviewed  the data as listed Lab Results  Component Value Date   WBC 6.4 06/20/2010   HGB 11.0 (L) 06/20/2010   HCT 34.0 (L) 06/20/2010   MCV 93.7 06/20/2010   PLT 206 06/20/2010   Lab Results  Component Value Date   NA 139 11/17/2011   K 4.3 11/17/2011   CL 102 11/17/2011   CO2 27 11/17/2011    RADIOGRAPHIC STUDIES: I have personally reviewed the radiological images as listed and agreed with the findings in the report. US Breast Ltd Uni Left Inc Axilla  Addendum Date: 02/07/2016   ADDENDUM REPORT: 02/07/2016 16:54 ADDENDUM: Note that the patient has a stable oval circumscribed mass in the retroareolar left breast previously characterized as a cyst. Although this cyst appears stable when compared to 2016 it is overall increasing in size and therefore given the patient's highly suspicious mass in the upper-outer left breast aspiration of the retroareolar left breast cyst is warranted. This will be scheduled for the patient. Electronically Signed   By: Everlean Alstrom M.D.   On: 02/07/2016 16:54   Result Date: 02/07/2016 CLINICAL DATA:  Screening recall for left breast distortion. EXAM: 2D DIGITAL DIAGNOSTIC UNILATERAL LEFT MAMMOGRAM WITH CAD AND ADJUNCT TOMO COMPARISON:  Previous exam(s). ACR Breast Density Category b: There are scattered areas of fibroglandular density. FINDINGS: Spot compression CC and MLO tomograms were performed of the outer left breast demonstrating an irregular mass with associated distortion measuring roughly 1.9 cm although exact measurements are difficult to determine. Mammographic images were processed with CAD. Physical examination of the upper-outer left breast reveals a masslike area of thickening at the approximate 1 o'clock position. Targeted ultrasound of the left breast was performed demonstrating an irregular shadowing hypoechoic mass at 1 o'clock 5 cm from nipple measuring approximately 2.3 x 1 x 1.6 cm. This corresponds well with mammography findings. No  lymphadenopathy seen in the left axilla. IMPRESSION: Highly suspicious left breast mass. RECOMMENDATION: Ultrasound-guided biopsy of the mass in the left breast is recommended. This will be performed later this afternoon. I have discussed the findings and recommendations with the patient. Results were also provided in writing at the conclusion of the visit. If applicable, a reminder letter will be sent to the patient regarding the next appointment. BI-RADS CATEGORY  5: Highly suggestive of malignancy. Electronically Signed: By: Everlean Alstrom M.D. On: 02/07/2016 12:34   Mm Diag Breast Tomo Uni Left  Addendum Date: 02/07/2016   ADDENDUM REPORT: 02/07/2016 16:54 ADDENDUM: Note that the patient has a stable oval circumscribed mass in the retroareolar left  breast previously characterized as a cyst. Although this cyst appears stable when compared to 2016 it is overall increasing in size and therefore given the patient's highly suspicious mass in the upper-outer left breast aspiration of the retroareolar left breast cyst is warranted. This will be scheduled for the patient. Electronically Signed   By: Everlean Alstrom M.D.   On: 02/07/2016 16:54   Result Date: 02/07/2016 CLINICAL DATA:  Screening recall for left breast distortion. EXAM: 2D DIGITAL DIAGNOSTIC UNILATERAL LEFT MAMMOGRAM WITH CAD AND ADJUNCT TOMO COMPARISON:  Previous exam(s). ACR Breast Density Category b: There are scattered areas of fibroglandular density. FINDINGS: Spot compression CC and MLO tomograms were performed of the outer left breast demonstrating an irregular mass with associated distortion measuring roughly 1.9 cm although exact measurements are difficult to determine. Mammographic images were processed with CAD. Physical examination of the upper-outer left breast reveals a masslike area of thickening at the approximate 1 o'clock position. Targeted ultrasound of the left breast was performed demonstrating an irregular shadowing  hypoechoic mass at 1 o'clock 5 cm from nipple measuring approximately 2.3 x 1 x 1.6 cm. This corresponds well with mammography findings. No lymphadenopathy seen in the left axilla. IMPRESSION: Highly suspicious left breast mass. RECOMMENDATION: Ultrasound-guided biopsy of the mass in the left breast is recommended. This will be performed later this afternoon. I have discussed the findings and recommendations with the patient. Results were also provided in writing at the conclusion of the visit. If applicable, a reminder letter will be sent to the patient regarding the next appointment. BI-RADS CATEGORY  5: Highly suggestive of malignancy. Electronically Signed: By: Everlean Alstrom M.D. On: 02/07/2016 12:34   Mm Screening Breast Tomo Bilateral  Result Date: 02/04/2016 CLINICAL DATA:  Screening. EXAM: 2D DIGITAL SCREENING BILATERAL MAMMOGRAM WITH CAD AND ADJUNCT TOMO COMPARISON:  Previous exam(s). ACR Breast Density Category b: There are scattered areas of fibroglandular density. FINDINGS: In the left breast, possible distortion warrants further evaluation. In the right breast, no findings suspicious for malignancy. Images were processed with CAD. IMPRESSION: Further evaluation is suggested for possible distortion in the left breast. RECOMMENDATION: Diagnostic mammogram and possibly ultrasound of the left breast. (Code:FI-L-46M) The patient will be contacted regarding the findings, and additional imaging will be scheduled. BI-RADS CATEGORY  0: Incomplete. Need additional imaging evaluation and/or prior mammograms for comparison. Electronically Signed   By: Lajean Manes M.D.   On: 02/04/2016 09:43   Mm Clip Placement Left  Result Date: 02/07/2016 CLINICAL DATA:  Post ultrasound-guided biopsy of a highly suspicious mass/ area of distortion in the left breast at the 1 o'clock position. EXAM: DIAGNOSTIC LEFT MAMMOGRAM POST ULTRASOUND BIOPSY COMPARISON:  Previous exam(s). FINDINGS: Mammographic images were  obtained following ultrasound guided biopsy of an irregular mass/area of distortion in the upper-outer left breast at the 1 o'clock position. A ribbon shaped biopsy marking clip is present at the site of biopsied distortion/ mass in the left breast. IMPRESSION: Appropriate ribbon shaped biopsy marking clip position post ultrasound-guided biopsy of a mass/ distortion in the left breast at 1 o'clock. Final Assessment: Post Procedure Mammograms for Marker Placement Electronically Signed   By: Everlean Alstrom M.D.   On: 02/07/2016 16:50   [place] Lt Breast Bx W Loc Dev 1st Lesion Img Bx Spec US Guide  Result Date: 02/07/2016 CLINICAL DATA:  70 year old female with a highly suspicious mass in the left breast at the 1 o'clock position. EXAM: ULTRASOUND GUIDED LEFT BREAST CORE NEEDLE BIOPSY COMPARISON:  Previous  exam(s). FINDINGS: I met with the patient and we discussed the procedure of ultrasound-guided biopsy, including benefits and alternatives. We discussed the high likelihood of a successful procedure. We discussed the risks of the procedure, including infection, bleeding, tissue injury, clip migration, and inadequate sampling. Informed written consent was given. The usual time-out protocol was performed immediately prior to the procedure. Using sterile technique and 1% Lidocaine as local anesthetic, under direct ultrasound visualization, a 12 gauge spring-loaded device was used to perform biopsy of the mass/ distortion in the upper-outer left breast using a lateral to medial approach. At the conclusion of the procedure a ribbon shaped tissue marker clip was deployed into the biopsy cavity. Follow up 2 view mammogram was performed and dictated separately. IMPRESSION: Ultrasound guided biopsy of irregular mass/distortion in the upper-outer left breast. No apparent complications. Electronically Signed   By: Everlean Alstrom M.D.   On: 02/07/2016 16:32   US Breast Aspiration Left  Result Date: 02/13/2016 CLINICAL  DATA:  Patient has recent diagnosis of invasive mammary carcinoma with invasive mammary carcinoma in situ left breast mass 1 o'clock position. Known cyst over the upper central left periareolar region slowly increased in size over the past few years, although slightly smaller compared to 2016. Patient presents for aspiration in light of recent cancer diagnosis. EXAM: ULTRASOUND GUIDED LEFT BREAST CYST ASPIRATION COMPARISON:  Previous exams. PROCEDURE: Using sterile technique, 1% lidocaine, under direct ultrasound visualization, needle aspiration of the targeted cyst at the 11:30 position of the left breast 4 cm from the nipple was performed. Preprocedure images demonstrate patient's 2.8 cm simple cyst (3.6 cm in greatest dimension 2016). Approximately 6-7 mL of serous/tan fluid was aspirated as the cyst collapsed completely diagnostic of a simple cyst. IMPRESSION: Ultrasound-guided aspiration of left breast cyst. No apparent complications. RECOMMENDATIONS: Recommend followup as per clinical treatment plan. Electronically Signed   By: Marin Olp M.D.   On: 02/13/2016 15:09    ASSESSMENT:  left breast cancer, ER/PR positive, Her 2/neu negative PLAN:   Breast cancer of upper-outer quadrant of left female breast [facility]) We discussed the general approach and management of localized left breast cancer With the help of the navigator, I will set up general surgery consultation for local resection. I will defer the surgeon to discuss options with the patient The outcome for lumpectomy plus radiation therapy is almost as effective as mastectomy Depending on the final staging report, we will decide if the patient with require adjuvant systemic chemotherapy After she completes local therapy, she will benefit from long-term anti-estrogen therapy When I see her next time, we will discuss getting another baseline bone density scan before I prescribe anti-estrogen therapy  Orders Placed This Encounter  Procedures   . Ambulatory referral to General Surgery    Referral Priority:   Routine    Referral Type:   Surgical    Referral Reason:   Specialty Services Required    Requested Specialty:   General Surgery    Number of Visits Requested:   1     All questions were answered. The patient knows to call the clinic with any problems, questions or concerns. I spent 55 minutes counseling the patient face to face. The total time spent in the appointment was 60 minutes and more than 50% was on counseling.     Heath Lark, MD 02/14/16 3:14 PM

## 2016-02-17 ENCOUNTER — Telehealth: Payer: Self-pay | Admitting: *Deleted

## 2016-02-17 NOTE — Telephone Encounter (Signed)
Called patient and left her a message with her appointment to see Dr. Tamala Julian on Tuesday February 18, 2016 @ 3:45.  Requested that she call me to confirm receipt of her appointment.

## 2016-02-18 DIAGNOSIS — C50412 Malignant neoplasm of upper-outer quadrant of left female breast: Secondary | ICD-10-CM | POA: Diagnosis not present

## 2016-02-18 DIAGNOSIS — Z170 Estrogen receptor positive status [ER+]: Secondary | ICD-10-CM | POA: Diagnosis not present

## 2016-02-19 ENCOUNTER — Telehealth: Payer: Self-pay | Admitting: *Deleted

## 2016-02-19 ENCOUNTER — Other Ambulatory Visit: Payer: Self-pay | Admitting: Surgery

## 2016-02-19 DIAGNOSIS — Z170 Estrogen receptor positive status [ER+]: Principal | ICD-10-CM

## 2016-02-19 DIAGNOSIS — C50412 Malignant neoplasm of upper-outer quadrant of left female breast: Secondary | ICD-10-CM

## 2016-02-19 NOTE — Telephone Encounter (Signed)
Patient called to inform me that her surgery date is March 03, 2016.  I will forward info on to Dr. Alvy Bimler so she can determine appropriate follow-up date.

## 2016-02-19 NOTE — Telephone Encounter (Signed)
Hi Sheena, please schedule return appt on 12/13, any time most convenient for her. I'm in [facility] that day

## 2016-02-20 ENCOUNTER — Other Ambulatory Visit: Payer: Self-pay | Admitting: Surgery

## 2016-02-20 DIAGNOSIS — C50412 Malignant neoplasm of upper-outer quadrant of left female breast: Secondary | ICD-10-CM

## 2016-02-20 DIAGNOSIS — Z170 Estrogen receptor positive status [ER+]: Principal | ICD-10-CM

## 2016-02-24 ENCOUNTER — Telehealth: Payer: Self-pay | Admitting: *Deleted

## 2016-02-24 ENCOUNTER — Encounter
Admission: RE | Admit: 2016-02-24 | Discharge: 2016-02-24 | Disposition: A | Discharge status: Home / Self Care | Payer: PPO | Source: Ambulatory Visit | Attending: Surgery | Admitting: Surgery

## 2016-02-24 ENCOUNTER — Other Ambulatory Visit: Payer: Self-pay

## 2016-02-24 DIAGNOSIS — Z01818 Encounter for other preprocedural examination: Secondary | ICD-10-CM | POA: Diagnosis not present

## 2016-02-24 DIAGNOSIS — E785 Hyperlipidemia, unspecified: Secondary | ICD-10-CM | POA: Diagnosis not present

## 2016-02-24 DIAGNOSIS — I10 Essential (primary) hypertension: Secondary | ICD-10-CM | POA: Insufficient documentation

## 2016-02-24 HISTORY — DX: Unspecified asthma, uncomplicated: J45.909

## 2016-02-24 NOTE — Patient Instructions (Signed)
Your procedure is scheduled on: Tuesday 03/03/16 Report to Mammography. [place]  Remember: Instructions that are not followed completely may result in serious medical risk, up to and including death, or upon the discretion of your surgeon and anesthesiologist your surgery may need to be rescheduled.    __X__ 1. Do not eat food or drink liquids after midnight. No gum chewing or hard candies.     __X__ 2. No Alcohol for 24 hours before or after surgery.   ____ 3. Bring all medications with you on the day of surgery if instructed.    __X__ 4. Notify your doctor if there is any change in your medical condition     (cold, fever, infections).     Do not wear jewelry, make-up, hairpins, clips or nail polish.  Do not wear lotions, powders, or perfumes.   Do not shave 48 hours prior to surgery. Men may shave face and neck.  Do not bring valuables to the hospital.    [facility] is not responsible for any belongings or valuables.               Contacts, dentures or bridgework may not be worn into surgery.  Leave your suitcase in the car. After surgery it may be brought to your room.  For patients admitted to the hospital, discharge time is determined by your                treatment team.   Patients discharged the day of surgery will not be allowed to drive home.   Please read over the following fact sheets that you were given:   Pain Booklet   __X__ Take these medicines the morning of surgery with A SIP OF WATER:    1. AMLODIPINE  2.   3.   4.  5.  6.  ____ Fleet Enema (as directed)   __X__ Use CHG Soap as directed  ____ Use inhalers on the day of surgery  ____ Stop metformin 2 days prior to surgery    ____ Take 1/2 of usual insulin dose the night before surgery and none on the morning of surgery.   ____ Stop Coumadin/Plavix/aspirin on   ____ Stop Anti-inflammatories on    ____ Stop supplements until after surgery.    ____ Bring C-Pap to the  hospital.

## 2016-02-24 NOTE — Telephone Encounter (Addendum)
Received in-basket message from Dr. Alvy Bimler to schedule patient for post surgery follow-up on 03/25/16.  Patient scheduled for 03/25/16 @ 11:00.  Left patient a message with appointment date and time and to please return my call.  Patient returned my call.  She is aware of her appointment on 03/25/16 @ 11:00.  Wants to speak to a Development worker, community.  She is going to pick up financial assistance paperwork and turn in to [facility].

## 2016-02-25 ENCOUNTER — Telehealth: Payer: Self-pay | Admitting: *Deleted

## 2016-02-25 ENCOUNTER — Other Ambulatory Visit: Payer: Self-pay | Admitting: Hematology and Oncology

## 2016-02-25 DIAGNOSIS — Z170 Estrogen receptor positive status [ER+]: Principal | ICD-10-CM

## 2016-02-25 DIAGNOSIS — C50412 Malignant neoplasm of upper-outer quadrant of left female breast: Secondary | ICD-10-CM

## 2016-02-25 NOTE — Telephone Encounter (Signed)
Patient's case was discussed last night in case conference.  It has been recommended that she have a  breast MRI prior to her surgery on 03/03/16.  Notified Dr. Alvy Bimler of the recommendations.  Notified patient that [facility] will be calling her to schedule the appointment, and I also notified Judeen Hammans, Dr. Thompson Caul nurse of the recommendation.  Discussed with Elliot Gault so we could get preauthorization.

## 2016-02-27 ENCOUNTER — Ambulatory Visit
Admission: RE | Admit: 2016-02-27 | Discharge: 2016-02-27 | Disposition: A | Discharge status: Home / Self Care | Payer: PPO | Source: Ambulatory Visit | Attending: Hematology and Oncology | Admitting: Hematology and Oncology

## 2016-02-27 DIAGNOSIS — C50411 Malignant neoplasm of upper-outer quadrant of right female breast: Secondary | ICD-10-CM | POA: Diagnosis not present

## 2016-02-27 DIAGNOSIS — C50412 Malignant neoplasm of upper-outer quadrant of left female breast: Secondary | ICD-10-CM

## 2016-02-27 DIAGNOSIS — Z170 Estrogen receptor positive status [ER+]: Principal | ICD-10-CM

## 2016-02-27 MED ORDER — GADOBENATE DIMEGLUMINE 529 MG/ML IV SOLN
18.0000 mL | Freq: Once | INTRAVENOUS | Status: AC | PRN
  Administered 2016-02-27: 18 mL via INTRAVENOUS

## 2016-02-28 ENCOUNTER — Telehealth: Payer: Self-pay | Admitting: *Deleted

## 2016-02-28 NOTE — Telephone Encounter (Signed)
Received message from Dr. Alvy Bimler that the patient's MRI results were good, that the mass was limited to the breast, and would I call the patient with her results.  I have spoken to the patient and reviewed her results.  She is very relieved.  She spoke highly of Dr. Alvy Bimler, and wanted her to know how much she likes her.  She is scheduled for surgery next week.

## 2016-03-03 ENCOUNTER — Ambulatory Visit: Payer: PPO | Admitting: Anesthesiology

## 2016-03-03 ENCOUNTER — Ambulatory Visit
Admission: RE | Admit: 2016-03-03 | Discharge: 2016-03-03 | Disposition: A | Discharge status: Home / Self Care | Payer: PPO | Source: Ambulatory Visit | Attending: Surgery | Admitting: Surgery

## 2016-03-03 ENCOUNTER — Encounter: Payer: Self-pay | Admitting: *Deleted

## 2016-03-03 ENCOUNTER — Encounter
Admission: RE | Disposition: A | Discharge status: Home / Self Care | Payer: Self-pay | Source: Ambulatory Visit | Attending: Surgery

## 2016-03-03 ENCOUNTER — Encounter: Payer: Self-pay | Admitting: Hematology and Oncology

## 2016-03-03 DIAGNOSIS — F329 Major depressive disorder, single episode, unspecified: Secondary | ICD-10-CM | POA: Diagnosis not present

## 2016-03-03 DIAGNOSIS — Z882 Allergy status to sulfonamides status: Secondary | ICD-10-CM | POA: Insufficient documentation

## 2016-03-03 DIAGNOSIS — E669 Obesity, unspecified: Secondary | ICD-10-CM | POA: Diagnosis not present

## 2016-03-03 DIAGNOSIS — Z880 Allergy status to penicillin: Secondary | ICD-10-CM | POA: Diagnosis not present

## 2016-03-03 DIAGNOSIS — I2510 Atherosclerotic heart disease of native coronary artery without angina pectoris: Secondary | ICD-10-CM | POA: Diagnosis not present

## 2016-03-03 DIAGNOSIS — Z170 Estrogen receptor positive status [ER+]: Principal | ICD-10-CM

## 2016-03-03 DIAGNOSIS — C50412 Malignant neoplasm of upper-outer quadrant of left female breast: Secondary | ICD-10-CM

## 2016-03-03 DIAGNOSIS — C773 Secondary and unspecified malignant neoplasm of axilla and upper limb lymph nodes: Secondary | ICD-10-CM | POA: Insufficient documentation

## 2016-03-03 DIAGNOSIS — I10 Essential (primary) hypertension: Secondary | ICD-10-CM | POA: Diagnosis not present

## 2016-03-03 DIAGNOSIS — Z888 Allergy status to other drugs, medicaments and biological substances status: Secondary | ICD-10-CM | POA: Diagnosis not present

## 2016-03-03 DIAGNOSIS — Z6832 Body mass index (BMI) 32.0-32.9, adult: Secondary | ICD-10-CM | POA: Diagnosis not present

## 2016-03-03 DIAGNOSIS — R928 Other abnormal and inconclusive findings on diagnostic imaging of breast: Secondary | ICD-10-CM | POA: Diagnosis not present

## 2016-03-03 DIAGNOSIS — C50912 Malignant neoplasm of unspecified site of left female breast: Secondary | ICD-10-CM | POA: Diagnosis not present

## 2016-03-03 DIAGNOSIS — F419 Anxiety disorder, unspecified: Secondary | ICD-10-CM | POA: Diagnosis not present

## 2016-03-03 DIAGNOSIS — Z881 Allergy status to other antibiotic agents status: Secondary | ICD-10-CM | POA: Diagnosis not present

## 2016-03-03 DIAGNOSIS — J45909 Unspecified asthma, uncomplicated: Secondary | ICD-10-CM | POA: Diagnosis not present

## 2016-03-03 HISTORY — DX: Diverticulosis of intestine, part unspecified, without perforation or abscess without bleeding: K57.90

## 2016-03-03 HISTORY — PX: BREAST LUMPECTOMY: SHX2

## 2016-03-03 HISTORY — PX: SENTINEL NODE BIOPSY: SHX6608

## 2016-03-03 HISTORY — PX: PARTIAL MASTECTOMY WITH NEEDLE LOCALIZATION: SHX6008

## 2016-03-03 SURGERY — PARTIAL MASTECTOMY WITH NEEDLE LOCALIZATION
Anesthesia: General | Laterality: Left | Wound class: Clean

## 2016-03-03 MED ORDER — BUPIVACAINE-EPINEPHRINE (PF) 0.5% -1:200000 IJ SOLN
INTRAMUSCULAR | Status: AC
  Filled 2016-03-03: qty 30

## 2016-03-03 MED ORDER — MEPERIDINE HCL 25 MG/ML IJ SOLN: 6.2500 mg | INTRAMUSCULAR | Status: DC | PRN

## 2016-03-03 MED ORDER — LACTATED RINGERS IV SOLN
INTRAVENOUS | Status: DC
  Administered 2016-03-03 (×2): via INTRAVENOUS

## 2016-03-03 MED ORDER — FENTANYL CITRATE (PF) 100 MCG/2ML IJ SOLN
INTRAMUSCULAR | Status: DC
Start: 2016-03-03 — End: 2016-03-03
  Filled 2016-03-03: qty 2

## 2016-03-03 MED ORDER — PROMETHAZINE HCL 25 MG/ML IJ SOLN: 6.2500 mg | INTRAMUSCULAR | Status: DC | PRN

## 2016-03-03 MED ORDER — FENTANYL CITRATE (PF) 100 MCG/2ML IJ SOLN
INTRAMUSCULAR | Status: DC | PRN
  Administered 2016-03-03: 100 ug via INTRAVENOUS

## 2016-03-03 MED ORDER — FENTANYL CITRATE (PF) 100 MCG/2ML IJ SOLN
INTRAMUSCULAR | Status: AC
  Filled 2016-03-03: qty 2

## 2016-03-03 MED ORDER — LIDOCAINE HCL (CARDIAC) 20 MG/ML IV SOLN
INTRAVENOUS | Status: DC | PRN
  Administered 2016-03-03: 100 mg via INTRAVENOUS

## 2016-03-03 MED ORDER — DEXAMETHASONE SODIUM PHOSPHATE 10 MG/ML IJ SOLN
INTRAMUSCULAR | Status: DC | PRN
  Administered 2016-03-03: 20 mg via INTRAVENOUS
  Administered 2016-03-03: 10 mg via INTRAVENOUS

## 2016-03-03 MED ORDER — ONDANSETRON HCL 4 MG/2ML IJ SOLN
INTRAMUSCULAR | Status: DC | PRN
  Administered 2016-03-03: 4 mg via INTRAVENOUS

## 2016-03-03 MED ORDER — FAMOTIDINE 20 MG PO TABS
20.0000 mg | ORAL_TABLET | Freq: Once | ORAL | Status: AC
  Administered 2016-03-03: 20 mg via ORAL

## 2016-03-03 MED ORDER — BUPIVACAINE-EPINEPHRINE 0.5% -1:200000 IJ SOLN
INTRAMUSCULAR | Status: DC | PRN
  Administered 2016-03-03: 30 mL

## 2016-03-03 MED ORDER — HYDROCODONE-ACETAMINOPHEN 5-325 MG PO TABS: 1.0000 | ORAL_TABLET | ORAL | 0 refills | Status: DC | PRN

## 2016-03-03 MED ORDER — HYDROCODONE-ACETAMINOPHEN 5-325 MG PO TABS: 1.0000 | ORAL_TABLET | ORAL | Status: DC | PRN

## 2016-03-03 MED ORDER — SUCCINYLCHOLINE CHLORIDE 20 MG/ML IJ SOLN
INTRAMUSCULAR | Status: DC | PRN
  Administered 2016-03-03: 140 mg via INTRAVENOUS

## 2016-03-03 MED ORDER — FAMOTIDINE 20 MG PO TABS
ORAL_TABLET | ORAL | Status: AC
  Filled 2016-03-03: qty 1

## 2016-03-03 MED ORDER — TECHNETIUM TC 99M SULFUR COLLOID
1.0810 | Freq: Once | INTRAVENOUS | Status: AC | PRN
  Administered 2016-03-03: 1.081 via INTRAVENOUS

## 2016-03-03 MED ORDER — PROPOFOL 10 MG/ML IV BOLUS
INTRAVENOUS | Status: DC | PRN
Start: 2016-03-03 — End: 2016-03-03
  Administered 2016-03-03: 50 mg via INTRAVENOUS
  Administered 2016-03-03: 40 mg via INTRAVENOUS
  Administered 2016-03-03: 130 mg via INTRAVENOUS
  Administered 2016-03-03: 40 mg via INTRAVENOUS

## 2016-03-03 MED ORDER — FENTANYL CITRATE (PF) 100 MCG/2ML IJ SOLN
25.0000 ug | INTRAMUSCULAR | Status: DC | PRN
  Administered 2016-03-03 (×3): 50 ug via INTRAVENOUS

## 2016-03-03 SURGICAL SUPPLY — 35 items
BLADE SURG 15 STRL LF DISP TIS (BLADE) ×1 IMPLANT
BLADE SURG 15 STRL SS (BLADE) ×3
CANISTER SUCT 1200ML W/VALVE (MISCELLANEOUS) ×3 IMPLANT
CHLORAPREP W/TINT 26ML (MISCELLANEOUS) ×3 IMPLANT
CNTNR SPEC 2.5X3XGRAD LEK (MISCELLANEOUS) ×2
CONT SPEC 4OZ STER OR WHT (MISCELLANEOUS) ×4
CONT SPEC 4OZ STRL OR WHT (MISCELLANEOUS) ×2
CONTAINER SPEC 2.5X3XGRAD LEK (MISCELLANEOUS) ×2 IMPLANT
DEVICE DUBIN SPECIMEN MAMMOGRA (MISCELLANEOUS) ×3 IMPLANT
DRAPE LAPAROTOMY 77X122 PED (DRAPES) ×3 IMPLANT
DRSG TELFA 3X8 NADH (GAUZE/BANDAGES/DRESSINGS) ×3 IMPLANT
ELECT REM PT RETURN 9FT ADLT (ELECTROSURGICAL) ×3
ELECTRODE REM PT RTRN 9FT ADLT (ELECTROSURGICAL) ×1 IMPLANT
GLOVE BIO SURGEON STRL SZ7.5 (GLOVE) ×9 IMPLANT
GOWN STRL REUS W/ TWL LRG LVL3 (GOWN DISPOSABLE) ×2 IMPLANT
GOWN STRL REUS W/TWL LRG LVL3 (GOWN DISPOSABLE) ×6
KIT RM TURNOVER STRD PROC AR (KITS) ×3 IMPLANT
LABEL OR SOLS (LABEL) ×3 IMPLANT
LIQUID BAND (GAUZE/BANDAGES/DRESSINGS) ×3 IMPLANT
MARGIN MAP 10MM (MISCELLANEOUS) ×3 IMPLANT
NDL HYPO 25X1 1.5 SAFETY (NEEDLE) ×1 IMPLANT
NDL SAFETY 18GX1.5 (NEEDLE) ×3 IMPLANT
NDL SAFETY 22GX1.5 (NEEDLE) ×3 IMPLANT
NEEDLE HYPO 25X1 1.5 SAFETY (NEEDLE) ×3 IMPLANT
PACK BASIN MINOR ARMC (MISCELLANEOUS) ×3 IMPLANT
PAD DRESSING TELFA 3X8 NADH (GAUZE/BANDAGES/DRESSINGS) IMPLANT
SLEVE PROBE SENORX GAMMA FIND (MISCELLANEOUS) ×3 IMPLANT
SUT CHROMIC 4 0 RB 1X27 (SUTURE) ×3 IMPLANT
SUT ETHILON 3-0 FS-10 30 BLK (SUTURE) ×3
SUT MNCRL 4-0 (SUTURE) ×3
SUT MNCRL 4-0 27XMFL (SUTURE) ×1
SUTURE EHLN 3-0 FS-10 30 BLK (SUTURE) ×1 IMPLANT
SUTURE MNCRL 4-0 27XMF (SUTURE) ×1 IMPLANT
SYRINGE 10CC LL (SYRINGE) ×3 IMPLANT
WATER STERILE IRR 1000ML POUR (IV SOLUTION) ×3 IMPLANT

## 2016-03-03 NOTE — Transfer of Care (Signed)
Immediate Anesthesia Transfer of Care Note  Patient: Jamie Curry  Procedure(s) Performed: Procedure(s): PARTIAL MASTECTOMY WITH NEEDLE LOCALIZATION (Left) SENTINEL NODE BIOPSY (Left)  Patient Location: PACU  Anesthesia Type:General  Level of Consciousness: awake, alert  and oriented  Airway & Oxygen Therapy: Patient Spontanous Breathing and Patient connected to face mask oxygen  Post-op Assessment: Report given to RN and Post -op Vital signs reviewed and stable  Post vital signs: Reviewed and stable  Last Vitals:  Vitals:   03/03/16 1008 03/03/16 1448  BP: 136/72 (!) 156/82  Pulse: (!) 57 80  Resp: 16 19  Temp: 36.7 C 36.4 C    Last Pain:  Vitals:   03/03/16 1448  TempSrc:   PainSc: 6          Complications: No apparent anesthesia complications

## 2016-03-03 NOTE — Anesthesia Postprocedure Evaluation (Signed)
Anesthesia Post Note  Patient: Jamie Curry  Procedure(s) Performed: Procedure(s) (LRB): PARTIAL MASTECTOMY WITH NEEDLE LOCALIZATION (Left) SENTINEL NODE BIOPSY (Left)  Patient location during evaluation: PACU Anesthesia Type: General Level of consciousness: awake and alert and oriented Pain management: pain level controlled Vital Signs Assessment: post-procedure vital signs reviewed and stable Respiratory status: spontaneous breathing, nonlabored ventilation and respiratory function stable Cardiovascular status: blood pressure returned to baseline and stable Postop Assessment: no signs of nausea or vomiting Anesthetic complications: no    Last Vitals:  Vitals:   03/03/16 1008 03/03/16 1448  BP: 136/72 (!) 156/82  Pulse: (!) 57 80  Resp: 16 19  Temp: 36.7 C 36.4 C    Last Pain:  Vitals:   03/03/16 1448  TempSrc:   PainSc: 6                  Sadik Piascik

## 2016-03-03 NOTE — Anesthesia Preprocedure Evaluation (Addendum)
Anesthesia Evaluation  Patient identified by MRN, date of birth, ID band Patient awake    Reviewed: Allergy & Precautions, NPO status , Patient's Chart, lab work & pertinent test results  History of Anesthesia Complications Negative for: history of anesthetic complications  Airway Mallampati: II  TM Distance: >3 FB Neck ROM: Full    Dental  (+) Edentulous Upper   Pulmonary asthma (mild intermittent) , neg PE   breath sounds clear to auscultation- rhonchi (-) wheezing      Cardiovascular Exercise Tolerance: Good hypertension, Pt. on medications (-) angina+ CAD  (-) Past MI, (-) Cardiac Stents and (-) CABG  Rhythm:Regular Rate:Normal - Systolic murmurs and - Diastolic murmurs    Neuro/Psych Anxiety Depression negative neurological ROS     GI/Hepatic negative GI ROS, Neg liver ROS,   Endo/Other  negative endocrine ROSneg diabetes  Renal/GU negative Renal ROS     Musculoskeletal negative musculoskeletal ROS (+)   Abdominal (+) + obese,   Peds  Hematology negative hematology ROS (+)   Anesthesia Other Findings Past Medical History: No date: Anxiety No date: Asthma No date: CAD (coronary artery disease)     Comment: mild CAD by cath in 2001 No date: Depression No date: Heart murmur No date: HTN (hypertension) No date: Hyperlipidemia No date: Palpitations No date: Syncope     Comment: Pre-syncope   Reproductive/Obstetrics                            Anesthesia Physical Anesthesia Plan  ASA: II  Anesthesia Plan: General   Post-op Pain Management:    Induction: Intravenous  Airway Management Planned: LMA  Additional Equipment:   Intra-op Plan:   Post-operative Plan:   Informed Consent: I have reviewed the patients History and Physical, chart, labs and discussed the procedure including the risks, benefits and alternatives for the proposed anesthesia with the patient or  authorized representative who has indicated his/her understanding and acceptance.   Dental advisory given  Plan Discussed with: Anesthesiologist and CRNA  Anesthesia Plan Comments:         Anesthesia Quick Evaluation

## 2016-03-03 NOTE — Anesthesia Procedure Notes (Signed)
Procedure Name: Intubation Performed by: Jennette Bill Pre-anesthesia Checklist: Patient identified, Patient being monitored, Timeout performed, Emergency Drugs available and Suction available Patient Re-evaluated:Patient Re-evaluated prior to inductionOxygen Delivery Method: Circle system utilized Preoxygenation: Pre-oxygenation with 100% oxygen Intubation Type: IV induction Ventilation: Mask ventilation without difficulty Laryngoscope Size: Mac and 3 Grade View: Grade I Tube type: Oral Tube size: 7.0 mm Number of attempts: 1 Airway Equipment and Method: Stylet Placement Confirmation: ETT inserted through vocal cords under direct vision,  positive ETCO2 and breath sounds checked- equal and bilateral Secured at: 20 cm Tube secured with: Tape Dental Injury: Teeth and Oropharynx as per pre-operative assessment

## 2016-03-03 NOTE — OR Nursing (Signed)
Dr. Smith into see pt and husband. 

## 2016-03-03 NOTE — Progress Notes (Signed)
Met patient prior to surgery.  Offered support.

## 2016-03-03 NOTE — Discharge Instructions (Addendum)
Take Tylenol or Norco if needed for pain. ° °Should not drive or do anything dangerous when taking Norco. ° °Wear bra as desired for comfort and support. ° °May shower and blot dry. ° °AMBULATORY SURGERY  °DISCHARGE INSTRUCTIONS ° ° °1) The drugs that you were given will stay in your system until tomorrow so for the next 24 hours you should not: ° °A) Drive an automobile °B) Make any legal decisions °C) Drink any alcoholic beverage ° ° °2) You may resume regular meals tomorrow.  Today it is better to start with liquids and gradually work up to solid foods. ° °You may eat anything you prefer, but it is better to start with liquids, then soup and crackers, and gradually work up to solid foods. ° ° °3) Please notify your doctor immediately if you have any unusual bleeding, trouble breathing, redness and pain at the surgery site, drainage, fever, or pain not relieved by medication. ° ° ° °4) Additional Instructions: ° ° ° ° ° ° ° °Please contact your physician with any problems or Same Day Surgery at [phone], Monday through Friday 6 am to 4 pm, or [facility] [place] Main number at [phone]. °

## 2016-03-03 NOTE — Op Note (Signed)
OPERATIVE REPORT  PREOPERATIVE  DIAGNOSIS: . Left breast cancer  POSTOPERATIVE DIAGNOSIS: . Left breast cancer  PROCEDURE: . Left partial mastectomy and sentinel lymph node biopsy  ANESTHESIA:  General  SURGEON: Rochel Brome  MD   INDICATIONS: . She had recent mammogram findings of a density in the upper outer quadrant of the left breast. Core needle biopsy demonstrated infiltrating mammary carcinoma. She had preoperative x-ray needle localization and injection of radioactive technetium sulfur colloid. Surgery was recommended for definitive treatment.  With the patient on the operating table in the supine position she was placed under general anesthesia. The dressing was removed from the lateral aspect of the left breast exposing the Kopan's wire which was cut 2 cm from the skin. Ultrasound was used to image the mass in the upper outer quadrant of the left breast. The breast was prepared with ChloraPrep and draped in a sterile manner. A curvilinear incision was made from 1:00 to 3:00 position 8-1/2 cm from the nipple removing a narrow ellipse of skin in continuity with the underlying specimen. Dissection was carried down to encounter the wire. A mass could be palpated and helped to direct the course of the dissection. Multiple bleeding points were cauterized. One bleeding point was suture ligated with 4-0 chromic. The mass was marked with margin maps to mark the cranial caudal medial lateral and deep margins. The margins appeared to be grossly satisfactorily. The mass of tissue was submitted for specimen mammogram and for pathology to check for margins. The radiologist reported satisfactory location of the biopsy marker. The pathologist said margins appeared to be satisfactory. The closest margin was the deep margin which was approximately 3 mm. I therefore elected to re-excise the deep margin which was done removing a portion of tissue proximally 4 x 4 by 0.8 cm. This was placed on Telfa and sutured  to the Telfa indicating that the new margin was on the Telfa and this was submitted for routine pathology  The axilla was probed with a gamma counter demonstrating location of radioactivity. An oblique incision was made in the inferior aspect of the axilla and carried down through subcutaneous tissues deeply within the axilla adjacent to the rib cage demonstrated location of radioactivity and found a lymph node which was removed with some surrounding fatty tissue. This lymph node was approximately 7-8 mm in dimension and was smooth and soft. The ex vivo count was approximately 1350 counts per second. The background count was less than 12 counts per second. There was no remaining palpable mass within the axilla. The axillary wound was inspected and could see hemostasis was intact. The subcutaneous tissues were infiltrated with half percent Sensorcaine with epinephrine. The wound was closed with running 4-0 Monocryl subcuticular suture.  After re-excising the deep margin at the partial mastectomy site the wound was inspected and could see hemostasis was intact. The subcutaneous tissues were infiltrated with half percent Sensorcaine with epinephrine. Also deeper tissues surrounding cautery artifact were infiltrated as well. The wound was closed with interrupted 4-0 chromic sutures in the subcutaneous tissues and running 4-0 Monocryl subcuticular suture. Both wounds were treated with Dermabond.   The patient tolerated surgery satisfactorily and was prepared for transfer to the recovery room  [facility].D.

## 2016-03-03 NOTE — H&P (Signed)
  She reports no change in overall condition since the office exam.  She has had a MRI of the breast. This demonstrated the site of the cancer  4.0 x 1.3 x 1.4 cm in maximal dimension. The MRI demonstrated no other area of concern. The MRI demonstrated no enlarged axillary lymph nodes.  Lab work reviewed  The left side was marked YES. She has had insertion of Kopan's wire. I have reviewed the images. She also had injection of radioactive technetium sulfur colloid.  I discussed the plan for left partial mastectomy with axillary sentinel lymph node biopsy. I discussed the operation care and follow-up

## 2016-03-04 ENCOUNTER — Encounter: Payer: Self-pay | Admitting: Surgery

## 2016-03-09 LAB — SURGICAL PATHOLOGY

## 2016-03-25 ENCOUNTER — Inpatient Hospital Stay: Payer: PPO | Attending: Hematology and Oncology | Admitting: Hematology and Oncology

## 2016-03-25 ENCOUNTER — Other Ambulatory Visit: Payer: Self-pay | Admitting: Hematology and Oncology

## 2016-03-25 ENCOUNTER — Encounter: Payer: Self-pay | Admitting: Hematology and Oncology

## 2016-03-25 VITALS — BP 159/83 | HR 55 | Temp 97.1°F | Resp 18 | Ht 64.0 in | Wt 186.0 lb

## 2016-03-25 DIAGNOSIS — Z9012 Acquired absence of left breast and nipple: Secondary | ICD-10-CM | POA: Insufficient documentation

## 2016-03-25 DIAGNOSIS — C50412 Malignant neoplasm of upper-outer quadrant of left female breast: Secondary | ICD-10-CM | POA: Diagnosis not present

## 2016-03-25 DIAGNOSIS — Z170 Estrogen receptor positive status [ER+]: Secondary | ICD-10-CM | POA: Insufficient documentation

## 2016-03-25 DIAGNOSIS — N951 Menopausal and female climacteric states: Secondary | ICD-10-CM

## 2016-03-25 DIAGNOSIS — Z79899 Other long term (current) drug therapy: Secondary | ICD-10-CM | POA: Diagnosis not present

## 2016-03-25 DIAGNOSIS — R232 Flushing: Secondary | ICD-10-CM

## 2016-03-25 NOTE — Assessment & Plan Note (Addendum)
She has significant hot flashes due to withdrawal of recent, replacement therapy. She is already on Zoloft. She have difficulties with sleep at night. I recommend addition of Benadryl as needed

## 2016-03-25 NOTE — Progress Notes (Signed)
[facility] OFFICE PROGRESS NOTE  Patient Care Team: Marton Redwood, MD as PCP - General (Internal Medicine)  SUMMARY OF ONCOLOGIC HISTORY:   Breast cancer of upper-outer quadrant of left female breast ([place])   02/15/2008 Imaging    Small  probable mildly complicated cysts within the upper outer quadrant right breast at the 11 o'clock position in the region of questionable nodularity noted on mammography.  Recommend follow-up right breast diagnostic mammogram and ultrasound in 6 months.         08/13/2008 Imaging    Ultrasound is performed, showing stable small minimal complicated cysts at the right breast 11 o'clock position 4 cm from the nipple, not changed compared prior exam      09/23/2010 Imaging    1.3 cm simple cyst located within the left breast at the 12 o'clock position corresponding to the mammographic finding.  No findings worrisome for malignancy.  Recommend annual screening mammography      09/23/2010 Mammogram    1.3 cm simple cyst located within the left breast at the 12 o'clock position corresponding to the mammographic finding.  No findings worrisome for malignancy.       02/05/2015 Imaging    A 2.5 x 3.5 cm oval circumscribed mass within the upper retroareolar left breast is identified. No suspicious calcifications or distortion identified.  On physical exam, a firm palpable mobile mass is identified at the 11:30 position of the left breast 4 cm from the nipple. Targeted ultrasound is performed, showing a 2.8 x 1.4 x 3.6 cm simple cyst at the 11:30 position of the left breast, corresponding to the screening study finding.      02/07/2016 Imaging    Targeted ultrasound of the left breast was performed demonstrating an irregular shadowing hypoechoic mass at 1 o'clock 5 cm from nipple measuring approximately 2.3 x 1 x 1.6 cm. This corresponds well with mammography findings. No lymphadenopathy seen in the left axilla.      02/07/2016 Mammogram    Note that  the patient has a stable oval circumscribed mass in the retroareolar left breast previously characterized as a cyst. Although this cyst appears stable when compared to 2016 it is overall increasing in size and therefore given the patient's highly suspicious mass in the upper-outer left breast aspiration of the retroareolar left breast cyst is warranted.      02/07/2016 Procedure    She had US guided biopsy of breast mass      02/07/2016 Pathology Results    Accession: [id number]: Biopsy confirmed invasive breast cancer, ER 10% positive, PR 10% positive and Her2/neu negative, Ki 67 15%  Additional molecular study with MammaPrint result came back low risk (10% risk of recurrence in 10 years)      02/27/2016 Imaging    MR breast showed area of known malignancy in the upper-outer quadrant of the left breast, measuring maximum of 4.0 cm (anterior-posterior) an associated clip artifact. No findings to indicate multicentric, multifocal, or contralateral malignancy. No MRI evidence for adenopathy      03/03/2016 Surgery    She underwent left partial mastectomy and sentinel lymph node biopsy      03/03/2016 Pathology Results    CASE: [phone] Invasive lobular carcinoma 2.3 cm, Histologic Grade (Nottingham Histologic Score) Glandular (Acinar)/Tubular Differentiation: Score 3 Nuclear Pleomorphism: Score 2 Mitotic Rate: Score 1 Total score: 6 Overall Grade: Grade 2 Tumor Size: 23 mm. Size of lymph node invasion is 2.5 mm       INTERVAL HISTORY:  Please see below for problem oriented charting. She feels well. She is healing well from recent surgery. She had minimum pain at the surgical scar She complained of significant hot flashes causing interference with sleep at night  REVIEW OF SYSTEMS:   Constitutional: Denies fevers, chills or abnormal weight loss Eyes: Denies blurriness of vision Ears, nose, mouth, throat, and face: Denies mucositis or sore throat Respiratory: Denies cough,  dyspnea or wheezes Cardiovascular: Denies palpitation, chest discomfort or lower extremity swelling Gastrointestinal:  Denies nausea, heartburn or change in bowel habits Skin: Denies abnormal skin rashes Lymphatics: Denies new lymphadenopathy or easy bruising Neurological:Denies numbness, tingling or new weaknesses Behavioral/Psych: Mood is stable, no new changes  All other systems were reviewed with the patient and are negative.  I have reviewed the past medical history, past surgical history, social history and family history with the patient and they are unchanged from previous note.  ALLERGIES:  is allergic to penicillins; zithromax [azithromycin]; cefuroxime axetil; codeine; erythromycin; nitrofurantoin; ofloxacin; propoxyphene n-acetaminophen; sulfonamide derivatives; and tetracycline.  MEDICATIONS:  Current Outpatient Prescriptions  Medication Sig Dispense Refill  . acetaminophen (TYLENOL) 500 MG tablet Take 1,000 mg by mouth every 6 (six) hours as needed for mild pain.    Marland Kitchen amLODipine (NORVASC) 5 MG tablet Take 5 mg by mouth daily.      . fluticasone (FLONASE) 50 MCG/ACT nasal spray Place 2 sprays into both nostrils daily.    . hydrochlorothiazide (HYDRODIURIL) 25 MG tablet Take 25 mg by mouth daily.    Marland Kitchen HYDROcodone-acetaminophen (NORCO) 5-325 MG tablet Take 1-2 tablets by mouth every 4 (four) hours as needed for moderate pain. 12 tablet 0  . montelukast (SINGULAIR) 10 MG tablet Take 10 mg by mouth at bedtime.    . sertraline (ZOLOFT) 100 MG tablet Take 100 mg by mouth at bedtime.     No current facility-administered medications for this visit.     PHYSICAL EXAMINATION: ECOG PERFORMANCE STATUS: 1 - Symptomatic but completely ambulatory  Vitals:   03/25/16 1059  BP: (!) 159/83  Pulse: (!) 55  Resp: 18  Temp: 97.1 F (36.2 C)   Filed Weights   03/25/16 1059  Weight: 186 lb (84.4 kg)    GENERAL:alert, no distress and comfortable SKIN: skin color, texture, turgor are  normal, no rashes or significant lesions EYES: normal, Conjunctiva are pink and non-injected, sclera clear Musculoskeletal:no cyanosis of digits and no clubbing  NEURO: alert & oriented x 3 with fluent speech, no focal motor/sensory deficits  LABORATORY DATA:  I have reviewed the data as listed    Component Value Date/Time   NA 139 11/17/2011 0854   K 4.3 11/17/2011 0854   CL 102 11/17/2011 0854   CO2 27 11/17/2011 0854   GLUCOSE 91 11/17/2011 0854   BUN 17 11/17/2011 0854   CREATININE 0.7 11/17/2011 0854   CALCIUM 9.0 11/17/2011 0854   PROT 7.2 11/17/2011 0854   ALBUMIN 4.0 11/17/2011 0854   AST 19 11/17/2011 0854   ALT 16 11/17/2011 0854   ALKPHOS 71 11/17/2011 0854   BILITOT 1.1 11/17/2011 0854   GFRNONAA >60 06/20/2010 0455   GFRAA  06/20/2010 0455    >60        The eGFR has been calculated using the MDRD equation. This calculation has not been validated in all clinical situations. eGFR's persistently <60 mL/min signify possible Chronic Kidney Disease.    No results found for: SPEP, UPEP  Lab Results  Component Value Date   WBC 6.4  06/20/2010   NEUTROABS 8.4 (H) 06/18/2010   HGB 11.0 (L) 06/20/2010   HCT 34.0 (L) 06/20/2010   MCV 93.7 06/20/2010   PLT 206 06/20/2010      Chemistry      Component Value Date/Time   NA 139 11/17/2011 0854   K 4.3 11/17/2011 0854   CL 102 11/17/2011 0854   CO2 27 11/17/2011 0854   BUN 17 11/17/2011 0854   CREATININE 0.7 11/17/2011 0854      Component Value Date/Time   CALCIUM 9.0 11/17/2011 0854   ALKPHOS 71 11/17/2011 0854   AST 19 11/17/2011 0854   ALT 16 11/17/2011 0854   BILITOT 1.1 11/17/2011 0854       RADIOGRAPHIC STUDIES: I have personally reviewed the radiological images as listed and agreed with the findings in the report. Mr Breast Bilateral W Wo Contrast  Result Date: 02/27/2016 CLINICAL DATA:  A new diagnosis of the left breast cancer. Ultrasound-guided core biopsy of mass in the 1 o'clock location  of the left breast shows grade 2 invasive and in situ mammary carcinoma. LABS:  None EXAM: BILATERAL BREAST MRI WITH AND WITHOUT CONTRAST TECHNIQUE: Multiplanar, multisequence MR images of both breasts were obtained prior to and following the intravenous administration of 18 ml of MultiHance. THREE-DIMENSIONAL MR IMAGE RENDERING ON INDEPENDENT WORKSTATION: Three-dimensional MR images were rendered by post-processing of the original MR data on an independent workstation. The three-dimensional MR images were interpreted, and findings are reported in the following complete MRI report for this study. Three dimensional images were evaluated at the independent DynaCad workstation COMPARISON:  Recent breast imaging performed at the [facility] 02/13/2016 and earlier FINDINGS: Breast composition: b. Scattered fibroglandular tissue. Background parenchymal enhancement: Minimal Right breast: No mass or abnormal enhancement. Left breast: Within the upper-outer quadrant of the left breast, there is clumped non mass enhancement measuring 4.0 x 1.3 x 1.4 cm. Within this area there is metallic artifact from recent ultrasound-guided core biopsy and placement of tissue marker clip. Enhancement within this region is medium wash- in and persistent type kinetics. No additional areas of concern are identified in the left breast. Lymph nodes: No abnormal appearing lymph nodes. Ancillary findings:  None. IMPRESSION: 1. Area of known malignancy in the upper-outer quadrant of the left breast, measuring maximum of 4.0 cm (anterior-posterior) an associated clip artifact. 2. No findings to indicate multicentric, multifocal, or contralateral malignancy. 3. No MRI evidence for adenopathy. RECOMMENDATION: Treatment plan is recommended. BI-RADS CATEGORY  6: Known biopsy-proven malignancy. Electronically Signed   By: Nolon Nations M.D.   On: 02/27/2016 16:37   Nm Sentinel Node Injection  Result Date:  03/03/2016 CLINICAL DATA:  Left breast cancer. EXAM: NUCLEAR MEDICINE BREAST LYMPHOSCINTIGRAPHY TECHNIQUE: Intradermal injection of radiopharmaceutical was performed along the left hilar periareolar region. There no complications. RADIOPHARMACEUTICALS:  Total of 1 mCi Millipore-filtered Technetium-60msulfur colloid. IMPRESSION: Uncomplicated intradermal injection of a total of 1 mCi Technetium-912mulfur colloid for purposes of sentinel node identification. Electronically Signed   By: ThMarcello MooresRegister   On: 03/03/2016 10:22   Mm Breast Surgical Specimen  Result Date: 03/03/2016 CLINICAL DATA:  Post left breast lumpectomy. EXAM: SPECIMEN RADIOGRAPH OF THE LEFT BREAST COMPARISON:  Previous exam(s). FINDINGS: Status post excision of the left breast. The wire tip and biopsy marker clip are present and are marked for pathology. There are wide margins radiographically. IMPRESSION: Specimen radiograph of the left breast. Electronically Signed   By: DoLinwood Dibbles.  On: 03/03/2016 13:48   Mm Lt Plc Breast Loc Dev   1st Lesion  Inc Mammo Guide  Result Date: 03/03/2016 CLINICAL DATA:  Left breast 1 o'clock invasive mammary carcinoma. EXAM: NEEDLE LOCALIZATION OF THE LEFT BREAST WITH MAMMO GUIDANCE COMPARISON:  Previous exams. FINDINGS: Patient presents for needle localization prior to left breast lumpectomy. I met with the patient and we discussed the procedure of needle localization including benefits and alternatives. We discussed the high likelihood of a successful procedure. We discussed the risks of the procedure, including infection, bleeding, tissue injury, and further surgery. Informed, written consent was given. The usual time-out protocol was performed immediately prior to the procedure. Using mammographic guidance, sterile technique, 1% lidocaine and a 7 cm modified Kopans needle, left breast 1 o'clock mass containing ribbon shaped marker was localized using lateral approach. The images were  marked. IMPRESSION: Needle localization of the left breast. No apparent complications. Electronically Signed   By: Fidela Salisbury M.D.   On: 03/03/2016 09:19    ASSESSMENT & PLAN:  Breast cancer of upper-outer quadrant of left female breast ([place]) I reviewed the test results with the patient and her husband. Mammogram, ultrasound and MRI did not detect lymph node involvement. Sentinel lymph node came back positive but only at 2.5 mm. MammaPrint result came back low risk of recurrence. Her breast cancer hormonal receptor positive, likely precipitated by taking hormone replacement therapy. Overall, based MammoPrint report, I'm comfortable not to recommend adjuvant chemotherapy. I will refer her to see radiation oncologist for adjuvant radiation therapy. I will order a bone density scan to be done before she returns to see me next month to discuss adjuvant antiestrogen therapy  Hot flashes She has significant hot flashes due to withdrawal of recent, replacement therapy. She is already on Zoloft. She have difficulties with sleep at night. I recommend addition of Benadryl as needed   Orders Placed This Encounter  Procedures  . DG Bone Density    Standing Status:   Future    Standing Expiration Date:   03/25/2017    Order Specific Question:   Reason for Exam (SYMPTOM  OR DIAGNOSIS REQUIRED)    Answer:   breast ca, osteopenia    Order Specific Question:   Preferred imaging location?    Answer:   [facility]  . Ambulatory referral to Radiation Oncology    Referral Priority:   Routine    Referral Type:   Consultation    Referral Reason:   Specialty Services Required    Requested Specialty:   Radiation Oncology    Number of Visits Requested:   1   All questions were answered. The patient knows to call the clinic with any problems, questions or concerns. No barriers to learning was detected. I spent 25 minutes counseling the patient face to face. The total time spent in the  appointment was 40 minutes and more than 50% was on counseling and review of test results     Heath Lark, MD 03/25/2016 11:43 AM

## 2016-03-25 NOTE — Assessment & Plan Note (Signed)
I reviewed the test results with the patient and her husband. Mammogram, ultrasound and MRI did not detect lymph node involvement. Sentinel lymph node came back positive but only at 2.5 mm. MammaPrint result came back low risk of recurrence. Her breast cancer hormonal receptor positive, likely precipitated by taking hormone replacement therapy. Overall, based MammoPrint report, I'm comfortable not to recommend adjuvant chemotherapy. I will refer her to see radiation oncologist for adjuvant radiation therapy. I will order a bone density scan to be done before she returns to see me next month to discuss adjuvant antiestrogen therapy

## 2016-04-03 ENCOUNTER — Encounter: Payer: Self-pay | Admitting: Radiation Oncology

## 2016-04-03 ENCOUNTER — Ambulatory Visit
Admission: RE | Admit: 2016-04-03 | Discharge: 2016-04-03 | Disposition: A | Discharge status: Home / Self Care | Payer: PPO | Source: Ambulatory Visit | Attending: Radiation Oncology | Admitting: Radiation Oncology

## 2016-04-03 VITALS — BP 139/81 | HR 58 | Temp 95.9°F | Wt 186.9 lb

## 2016-04-03 DIAGNOSIS — E785 Hyperlipidemia, unspecified: Secondary | ICD-10-CM | POA: Insufficient documentation

## 2016-04-03 DIAGNOSIS — R011 Cardiac murmur, unspecified: Secondary | ICD-10-CM | POA: Diagnosis not present

## 2016-04-03 DIAGNOSIS — Z8719 Personal history of other diseases of the digestive system: Secondary | ICD-10-CM | POA: Insufficient documentation

## 2016-04-03 DIAGNOSIS — F329 Major depressive disorder, single episode, unspecified: Secondary | ICD-10-CM | POA: Diagnosis not present

## 2016-04-03 DIAGNOSIS — I2510 Atherosclerotic heart disease of native coronary artery without angina pectoris: Secondary | ICD-10-CM | POA: Diagnosis not present

## 2016-04-03 DIAGNOSIS — Z170 Estrogen receptor positive status [ER+]: Secondary | ICD-10-CM | POA: Insufficient documentation

## 2016-04-03 DIAGNOSIS — F419 Anxiety disorder, unspecified: Secondary | ICD-10-CM | POA: Diagnosis not present

## 2016-04-03 DIAGNOSIS — I10 Essential (primary) hypertension: Secondary | ICD-10-CM | POA: Insufficient documentation

## 2016-04-03 DIAGNOSIS — Z79899 Other long term (current) drug therapy: Secondary | ICD-10-CM | POA: Insufficient documentation

## 2016-04-03 DIAGNOSIS — Z809 Family history of malignant neoplasm, unspecified: Secondary | ICD-10-CM | POA: Diagnosis not present

## 2016-04-03 DIAGNOSIS — R002 Palpitations: Secondary | ICD-10-CM | POA: Insufficient documentation

## 2016-04-03 DIAGNOSIS — Z510 Encounter for antineoplastic radiation therapy: Secondary | ICD-10-CM | POA: Diagnosis not present

## 2016-04-03 DIAGNOSIS — Z9011 Acquired absence of right breast and nipple: Secondary | ICD-10-CM | POA: Diagnosis not present

## 2016-04-03 DIAGNOSIS — J45909 Unspecified asthma, uncomplicated: Secondary | ICD-10-CM | POA: Diagnosis not present

## 2016-04-03 DIAGNOSIS — C779 Secondary and unspecified malignant neoplasm of lymph node, unspecified: Secondary | ICD-10-CM | POA: Insufficient documentation

## 2016-04-03 DIAGNOSIS — C50412 Malignant neoplasm of upper-outer quadrant of left female breast: Secondary | ICD-10-CM

## 2016-04-03 DIAGNOSIS — R55 Syncope and collapse: Secondary | ICD-10-CM | POA: Insufficient documentation

## 2016-04-03 NOTE — Consult Note (Signed)
NEW PATIENT EVALUATION  Name: Jamie Curry  MRN: [id number]  Date:   04/03/2016     DOB: 1945-11-24   This 70 y.o. female patient presents to the clinic for initial evaluation of invasive lobular carcinoma left breast pathologic stage IIb (pT2 N1 a (sn) M0) mildly ER/PR positive status post wide local excision and sentinel node biopsy  REFERRING PHYSICIAN: Marton Redwood, MD  CHIEF COMPLAINT:  Chief Complaint  Patient presents with  . Breast Cancer    Initail evaluation    DIAGNOSIS: The encounter diagnosis was Malignant neoplasm of upper-outer quadrant of left breast in female, estrogen receptor positive ([place]).   PREVIOUS INVESTIGATIONS:  Pathology reports reviewed Mammograms and ultrasound reviewed Clinical notes reviewed  HPI: Patient is a 70 year old female who presented with an abnormal mammogram of the left breast showing which initially was thought to be a cyst in the retroareolar region increased in size in comparison to prior studies as well as suspicious mass in the upper outer quadrant of the left breast. Ultrasound-guided biopsy was positive for grade 2 invasive and in situ mammary carcinoma at the 1:00 position. MRI scan confirmed an area 4 x 1.3 x 1.4 cm in the upper outer quadrant left breast consistent with known malignancy. Lymph nodes appeared normal. Patient underwent a wide local excision and sentinel node biopsy. Tumor was invasive lobular carcinoma overall grade 2. Margins were clear at 0.5 mm. Overall tumor size was 2.3 cm. One lymph node had a 2.5 mm area of metastatic lobular carcinoma. All margins were clear. Tumor was weakly ER/PR positive at 10% HER-2/neu not overexpressed. Patient has been seen by medical oncology based on histology ER/PR positive status was not thought to be candidate for systemic chemotherapy. MammaPrint showed a low risk of recurrence which also strengthen argument for no chemotherapy. Patient has done well postoperatively. She specifically  denies breast tenderness cough or bone pain. She seen today for radiation oncology opinion.  PLANNED TREATMENT REGIMEN: Left whole breast and peripheral lymphatic radiation  PAST MEDICAL HISTORY:  has a past medical history of Anxiety; Asthma; Breast cancer ([place]); CAD (coronary artery disease); Depression; Diverticulosis; Heart murmur; HTN (hypertension); Hyperlipidemia; Palpitations; and Syncope.    PAST SURGICAL HISTORY:  Past Surgical History:  Procedure Laterality Date  . ABDOMINAL HYSTERECTOMY    . BREAST BIOPSY    . CHOLECYSTECTOMY    . KNEE ARTHROSCOPY Right   . PARTIAL MASTECTOMY WITH NEEDLE LOCALIZATION Left 03/03/2016   Procedure: PARTIAL MASTECTOMY WITH NEEDLE LOCALIZATION;  Surgeon: Leonie Green, MD;  Location: [facility] ORS;  Service: General;  Laterality: Left;  . SENTINEL NODE BIOPSY Left 03/03/2016   Procedure: SENTINEL NODE BIOPSY;  Surgeon: Leonie Green, MD;  Location: [facility] ORS;  Service: General;  Laterality: Left;  . TUBAL LIGATION      FAMILY HISTORY: family history includes Cancer in her father and sister.  SOCIAL HISTORY:  reports that she has never smoked. She has never used smokeless tobacco. She reports that she does not drink alcohol or use drugs.  ALLERGIES: Penicillins; Zithromax [azithromycin]; Cefuroxime axetil; Codeine; Erythromycin; Nitrofurantoin; Ofloxacin; Propoxyphene n-acetaminophen; Sulfonamide derivatives; and Tetracycline  MEDICATIONS:  Current Outpatient Prescriptions  Medication Sig Dispense Refill  . acetaminophen (TYLENOL) 500 MG tablet Take 1,000 mg by mouth every 6 (six) hours as needed for mild pain.    Marland Kitchen amLODipine (NORVASC) 5 MG tablet Take 5 mg by mouth daily.      . fluticasone (FLONASE) 50 MCG/ACT nasal spray Place 2 sprays into  both nostrils daily.    . hydrochlorothiazide (HYDRODIURIL) 25 MG tablet Take 25 mg by mouth daily.    Marland Kitchen HYDROcodone-acetaminophen (NORCO) 5-325 MG tablet Take 1-2 tablets by mouth every 4 (four)  hours as needed for moderate pain. 12 tablet 0  . montelukast (SINGULAIR) 10 MG tablet Take 10 mg by mouth at bedtime.    . sertraline (ZOLOFT) 100 MG tablet Take 100 mg by mouth at bedtime.     No current facility-administered medications for this encounter.     ECOG PERFORMANCE STATUS:  0 - Asymptomatic  REVIEW OF SYSTEMS:  Patient denies any weight loss, fatigue, weakness, fever, chills or night sweats. Patient denies any loss of vision, blurred vision. Patient denies any ringing  of the ears or hearing loss. No irregular heartbeat. Patient denies heart murmur or history of fainting. Patient denies any chest pain or pain radiating to her upper extremities. Patient denies any shortness of breath, difficulty breathing at night, cough or hemoptysis. Patient denies any swelling in the lower legs. Patient denies any nausea vomiting, vomiting of blood, or coffee ground material in the vomitus. Patient denies any stomach pain. Patient states has had normal bowel movements no significant constipation or diarrhea. Patient denies any dysuria, hematuria or significant nocturia. Patient denies any problems walking, swelling in the joints or loss of balance. Patient denies any skin changes, loss of hair or loss of weight. Patient denies any excessive worrying or anxiety or significant depression. Patient denies any problems with insomnia. Patient denies excessive thirst, polyuria, polydipsia. Patient denies any swollen glands, patient denies easy bruising or easy bleeding. Patient denies any recent infections, allergies or URI. Patient "s visual fields have not changed significantly in recent time.    PHYSICAL EXAM: BP 139/81   Pulse (!) 58   Temp (!) 95.9 F (35.5 C)   Wt 186 lb 15.2 oz (84.8 kg)   BMI 32.09 kg/m  Patient status post wide local excision of the left breast. Sentinel node and lumpectomy site are both healing well no dominant mass or nodularity is noted in either breast in 2 positions  examined. No axillary or supraclavicular adenopathy is appreciated. Well-developed well-nourished patient in NAD. HEENT reveals PERLA, EOMI, discs not visualized.  Oral cavity is clear. No oral mucosal lesions are identified. Neck is clear without evidence of cervical or supraclavicular adenopathy. Lungs are clear to A&P. Cardiac examination is essentially unremarkable with regular rate and rhythm without murmur rub or thrill. Abdomen is benign with no organomegaly or masses noted. Motor sensory and DTR levels are equal and symmetric in the upper and lower extremities. Cranial nerves II through XII are grossly intact. Proprioception is intact. No peripheral adenopathy or edema is identified. No motor or sensory levels are noted. Crude visual fields are within normal range.  LABORATORY DATA: Pathology reports reviewed and compatible with the above-stated findings    RADIOLOGY RESULTS: Mammograms ultrasound and MRI scans reviewed   IMPRESSION: Stage IIB invasive lobular carcinoma the left breast status post wide local excision and sentinel node biopsy borderline ER/PR positive HER-2/neu negative in 70 year old female  PLAN: At this time I have recommended whole breast and peripheral lymphatic radiation based on the sentinel node having a 2.5 mm area of metastatic involvement. This is in lieu of any systemic chemotherapy. I will plan on delivering 5040 cGy in 28 fractions to both the supraclavicular region and left breast. I would also boost her scar another 1400 cGy using electron beam. Risks and benefits of  treatment including skin reaction fatigue alteration of blood counts possible inclusion of superficial lung and slight chance of lymphedema in her left upper extremity all were discussed in detail. I've advised her she needs to exercise her left arm to prevent any lymphedema from developing. I personally set up and ordered CT simulation for next week. Patient also will be a candidate for antiestrogen  therapy after completion of radiation.  I would like to take this opportunity to thank you for allowing me to participate in the care of your patient.Armstead Peaks., MD

## 2016-04-10 ENCOUNTER — Ambulatory Visit
Admission: RE | Admit: 2016-04-10 | Discharge: 2016-04-10 | Disposition: A | Discharge status: Home / Self Care | Payer: PPO | Source: Ambulatory Visit | Attending: Radiation Oncology | Admitting: Radiation Oncology

## 2016-04-10 DIAGNOSIS — Z170 Estrogen receptor positive status [ER+]: Secondary | ICD-10-CM | POA: Diagnosis not present

## 2016-04-10 DIAGNOSIS — Z510 Encounter for antineoplastic radiation therapy: Secondary | ICD-10-CM | POA: Diagnosis not present

## 2016-04-10 DIAGNOSIS — C50412 Malignant neoplasm of upper-outer quadrant of left female breast: Secondary | ICD-10-CM | POA: Diagnosis not present

## 2016-04-13 DIAGNOSIS — Z923 Personal history of irradiation: Secondary | ICD-10-CM

## 2016-04-13 HISTORY — DX: Personal history of irradiation: Z92.3

## 2016-04-17 ENCOUNTER — Other Ambulatory Visit: Payer: Self-pay | Admitting: *Deleted

## 2016-04-17 DIAGNOSIS — Z170 Estrogen receptor positive status [ER+]: Secondary | ICD-10-CM | POA: Diagnosis not present

## 2016-04-17 DIAGNOSIS — C50412 Malignant neoplasm of upper-outer quadrant of left female breast: Secondary | ICD-10-CM | POA: Diagnosis not present

## 2016-04-17 DIAGNOSIS — Z510 Encounter for antineoplastic radiation therapy: Secondary | ICD-10-CM | POA: Diagnosis not present

## 2016-04-23 ENCOUNTER — Ambulatory Visit
Admission: RE | Admit: 2016-04-23 | Discharge: 2016-04-23 | Disposition: A | Discharge status: Home / Self Care | Payer: PPO | Source: Ambulatory Visit | Attending: Radiation Oncology | Admitting: Radiation Oncology

## 2016-04-23 DIAGNOSIS — C50412 Malignant neoplasm of upper-outer quadrant of left female breast: Secondary | ICD-10-CM | POA: Diagnosis not present

## 2016-04-23 DIAGNOSIS — Z170 Estrogen receptor positive status [ER+]: Secondary | ICD-10-CM | POA: Diagnosis not present

## 2016-04-23 DIAGNOSIS — Z510 Encounter for antineoplastic radiation therapy: Secondary | ICD-10-CM | POA: Diagnosis not present

## 2016-04-27 ENCOUNTER — Ambulatory Visit
Admission: RE | Admit: 2016-04-27 | Discharge: 2016-04-27 | Disposition: A | Discharge status: Home / Self Care | Payer: PPO | Source: Ambulatory Visit | Attending: Radiation Oncology | Admitting: Radiation Oncology

## 2016-04-27 DIAGNOSIS — Z510 Encounter for antineoplastic radiation therapy: Secondary | ICD-10-CM | POA: Diagnosis not present

## 2016-04-28 ENCOUNTER — Ambulatory Visit
Admission: RE | Admit: 2016-04-28 | Discharge: 2016-04-28 | Disposition: A | Discharge status: Home / Self Care | Payer: PPO | Source: Ambulatory Visit | Attending: Radiation Oncology | Admitting: Radiation Oncology

## 2016-04-28 DIAGNOSIS — Z510 Encounter for antineoplastic radiation therapy: Secondary | ICD-10-CM | POA: Diagnosis not present

## 2016-04-29 ENCOUNTER — Ambulatory Visit: Payer: PPO

## 2016-04-30 ENCOUNTER — Ambulatory Visit: Payer: PPO

## 2016-05-01 ENCOUNTER — Ambulatory Visit: Payer: PPO

## 2016-05-04 ENCOUNTER — Ambulatory Visit
Admission: RE | Admit: 2016-05-04 | Discharge: 2016-05-04 | Disposition: A | Discharge status: Home / Self Care | Payer: PPO | Source: Ambulatory Visit | Attending: Radiation Oncology | Admitting: Radiation Oncology

## 2016-05-04 ENCOUNTER — Ambulatory Visit: Payer: PPO

## 2016-05-04 DIAGNOSIS — Z510 Encounter for antineoplastic radiation therapy: Secondary | ICD-10-CM | POA: Diagnosis not present

## 2016-05-05 ENCOUNTER — Ambulatory Visit
Admission: RE | Admit: 2016-05-05 | Discharge: 2016-05-05 | Disposition: A | Discharge status: Home / Self Care | Payer: PPO | Source: Ambulatory Visit | Attending: Hematology and Oncology | Admitting: Hematology and Oncology

## 2016-05-05 ENCOUNTER — Ambulatory Visit
Admission: RE | Admit: 2016-05-05 | Discharge: 2016-05-05 | Disposition: A | Discharge status: Home / Self Care | Payer: PPO | Source: Ambulatory Visit | Attending: Radiation Oncology | Admitting: Radiation Oncology

## 2016-05-05 DIAGNOSIS — C50412 Malignant neoplasm of upper-outer quadrant of left female breast: Secondary | ICD-10-CM | POA: Insufficient documentation

## 2016-05-05 DIAGNOSIS — Z780 Asymptomatic menopausal state: Secondary | ICD-10-CM | POA: Diagnosis not present

## 2016-05-05 DIAGNOSIS — Z170 Estrogen receptor positive status [ER+]: Secondary | ICD-10-CM | POA: Diagnosis not present

## 2016-05-05 DIAGNOSIS — Z510 Encounter for antineoplastic radiation therapy: Secondary | ICD-10-CM | POA: Diagnosis not present

## 2016-05-06 ENCOUNTER — Ambulatory Visit
Admission: RE | Admit: 2016-05-06 | Discharge: 2016-05-06 | Disposition: A | Discharge status: Home / Self Care | Payer: PPO | Source: Ambulatory Visit | Attending: Radiation Oncology | Admitting: Radiation Oncology

## 2016-05-06 DIAGNOSIS — Z170 Estrogen receptor positive status [ER+]: Secondary | ICD-10-CM | POA: Diagnosis not present

## 2016-05-06 DIAGNOSIS — C50412 Malignant neoplasm of upper-outer quadrant of left female breast: Secondary | ICD-10-CM | POA: Diagnosis not present

## 2016-05-06 DIAGNOSIS — Z510 Encounter for antineoplastic radiation therapy: Secondary | ICD-10-CM | POA: Diagnosis not present

## 2016-05-07 ENCOUNTER — Ambulatory Visit: Payer: PPO

## 2016-05-07 DIAGNOSIS — Z510 Encounter for antineoplastic radiation therapy: Secondary | ICD-10-CM | POA: Diagnosis not present

## 2016-05-08 ENCOUNTER — Ambulatory Visit: Payer: PPO

## 2016-05-08 DIAGNOSIS — Z510 Encounter for antineoplastic radiation therapy: Secondary | ICD-10-CM | POA: Diagnosis not present

## 2016-05-11 ENCOUNTER — Ambulatory Visit
Admission: RE | Admit: 2016-05-11 | Discharge: 2016-05-11 | Disposition: A | Discharge status: Home / Self Care | Payer: PPO | Source: Ambulatory Visit | Attending: Radiation Oncology | Admitting: Radiation Oncology

## 2016-05-11 ENCOUNTER — Inpatient Hospital Stay: Payer: PPO | Attending: Radiation Oncology

## 2016-05-11 ENCOUNTER — Telehealth: Payer: Self-pay | Admitting: *Deleted

## 2016-05-11 DIAGNOSIS — Z79899 Other long term (current) drug therapy: Secondary | ICD-10-CM | POA: Insufficient documentation

## 2016-05-11 DIAGNOSIS — Z9012 Acquired absence of left breast and nipple: Secondary | ICD-10-CM | POA: Insufficient documentation

## 2016-05-11 DIAGNOSIS — N951 Menopausal and female climacteric states: Secondary | ICD-10-CM | POA: Insufficient documentation

## 2016-05-11 DIAGNOSIS — Z170 Estrogen receptor positive status [ER+]: Secondary | ICD-10-CM | POA: Diagnosis not present

## 2016-05-11 DIAGNOSIS — C50412 Malignant neoplasm of upper-outer quadrant of left female breast: Secondary | ICD-10-CM | POA: Insufficient documentation

## 2016-05-11 DIAGNOSIS — Z510 Encounter for antineoplastic radiation therapy: Secondary | ICD-10-CM | POA: Diagnosis not present

## 2016-05-11 LAB — CBC
HCT: 38.3 % (ref 35.0–47.0)
HEMOGLOBIN: 13.1 g/dL (ref 12.0–16.0)
MCH: 30.9 pg (ref 26.0–34.0)
MCHC: 34.3 g/dL (ref 32.0–36.0)
MCV: 90.1 fL (ref 80.0–100.0)
Platelets: 209 10*3/uL (ref 150–440)
RBC: 4.25 MIL/uL (ref 3.80–5.20)
RDW: 13.6 % (ref 11.5–14.5)
WBC: 5.4 10*3/uL (ref 3.6–11.0)

## 2016-05-11 NOTE — Telephone Encounter (Signed)
Patient called with questions regarding her next appointment with Dr. Alvy Bimler.  States she has two appointments, one with Gorsuch on 1/31 and one with Roa on 2/1.  She needs clarification.  Called patient and left her a message that the only appointment I see is with DR. Roa on 05/14/16.  Informed her that dr. Alvy Bimler is not coming back to this campus anymore.

## 2016-05-12 ENCOUNTER — Ambulatory Visit: Payer: PPO

## 2016-05-12 DIAGNOSIS — Z510 Encounter for antineoplastic radiation therapy: Secondary | ICD-10-CM | POA: Diagnosis not present

## 2016-05-13 ENCOUNTER — Ambulatory Visit
Admission: RE | Admit: 2016-05-13 | Discharge: 2016-05-13 | Disposition: A | Discharge status: Home / Self Care | Payer: PPO | Source: Ambulatory Visit | Attending: Radiation Oncology | Admitting: Radiation Oncology

## 2016-05-13 ENCOUNTER — Ambulatory Visit: Payer: PPO | Admitting: Hematology and Oncology

## 2016-05-13 DIAGNOSIS — Z510 Encounter for antineoplastic radiation therapy: Secondary | ICD-10-CM | POA: Diagnosis not present

## 2016-05-13 DIAGNOSIS — Z170 Estrogen receptor positive status [ER+]: Secondary | ICD-10-CM | POA: Diagnosis not present

## 2016-05-13 DIAGNOSIS — C50412 Malignant neoplasm of upper-outer quadrant of left female breast: Secondary | ICD-10-CM | POA: Diagnosis not present

## 2016-05-14 ENCOUNTER — Encounter: Payer: Self-pay | Admitting: Oncology

## 2016-05-14 ENCOUNTER — Inpatient Hospital Stay: Payer: PPO | Attending: Oncology | Admitting: Oncology

## 2016-05-14 ENCOUNTER — Telehealth: Payer: Self-pay | Admitting: *Deleted

## 2016-05-14 ENCOUNTER — Ambulatory Visit
Admission: RE | Admit: 2016-05-14 | Discharge: 2016-05-14 | Disposition: A | Discharge status: Home / Self Care | Payer: PPO | Source: Ambulatory Visit | Attending: Radiation Oncology | Admitting: Radiation Oncology

## 2016-05-14 VITALS — BP 147/82 | HR 65 | Temp 96.3°F | Resp 18 | Wt 187.2 lb

## 2016-05-14 DIAGNOSIS — Z170 Estrogen receptor positive status [ER+]: Secondary | ICD-10-CM | POA: Diagnosis not present

## 2016-05-14 DIAGNOSIS — J45909 Unspecified asthma, uncomplicated: Secondary | ICD-10-CM | POA: Insufficient documentation

## 2016-05-14 DIAGNOSIS — E7800 Pure hypercholesterolemia, unspecified: Secondary | ICD-10-CM | POA: Diagnosis not present

## 2016-05-14 DIAGNOSIS — Z79899 Other long term (current) drug therapy: Secondary | ICD-10-CM | POA: Diagnosis not present

## 2016-05-14 DIAGNOSIS — C50412 Malignant neoplasm of upper-outer quadrant of left female breast: Secondary | ICD-10-CM | POA: Diagnosis not present

## 2016-05-14 DIAGNOSIS — I10 Essential (primary) hypertension: Secondary | ICD-10-CM | POA: Diagnosis not present

## 2016-05-14 DIAGNOSIS — F419 Anxiety disorder, unspecified: Secondary | ICD-10-CM | POA: Diagnosis not present

## 2016-05-14 DIAGNOSIS — Z510 Encounter for antineoplastic radiation therapy: Secondary | ICD-10-CM | POA: Diagnosis not present

## 2016-05-14 DIAGNOSIS — N951 Menopausal and female climacteric states: Secondary | ICD-10-CM | POA: Diagnosis not present

## 2016-05-14 DIAGNOSIS — F329 Major depressive disorder, single episode, unspecified: Secondary | ICD-10-CM | POA: Diagnosis not present

## 2016-05-14 NOTE — Telephone Encounter (Signed)
Called [facility] and wanted to speak to Dr.Shaw about taking pt off her current antidepressant sertraline and possibly put her on citalopram to help with her hot flashes.  I left message on the voicemail for the doctor and asked him to call Dr. Janese Banks back on her cell phone.

## 2016-05-14 NOTE — Progress Notes (Signed)
Here for follow up. States she is experiencing severe hot flashes at day and night-3 x d-unbearable. Asking it anything  Med wise would help.going through a lot of stress-brother died o-2 days ago, seperated  from husband in november. Feeling stressed she admitted. Sister dx w bone cancer. Limited support per pt

## 2016-05-14 NOTE — Progress Notes (Signed)
Hematology/Oncology Consult note [facility]  Telephone:([phone] [phone]  Patient Care Team: Marton Redwood, MD as PCP - General (Internal Medicine)   Name of the patient: Jamie Curry  [id number]  March 12, 1946   Date of visit: 05/14/16  Diagnosis- Stage IIb pT2N1a(sn)cM0 invasive lobular carcinoma of the left breast ER 10% positive, PR 10% positive, HER-2/neu negative status post lumpectomy/SLND  Chief complaint/ Reason for visit- discuss hormone therapy options for breast cancer  Heme/Onc history:    Breast cancer of upper-outer quadrant of left female breast ([place])   02/15/2008 Imaging    Small  probable mildly complicated cysts within the upper outer quadrant right breast at the 11 o'clock position in the region of questionable nodularity noted on mammography.  Recommend follow-up right breast diagnostic mammogram and ultrasound in 6 months.         08/13/2008 Imaging    Ultrasound is performed, showing stable small minimal complicated cysts at the right breast 11 o'clock position 4 cm from the nipple, not changed compared prior exam      09/23/2010 Imaging    1.3 cm simple cyst located within the left breast at the 12 o'clock position corresponding to the mammographic finding.  No findings worrisome for malignancy.  Recommend annual screening mammography      09/23/2010 Mammogram    1.3 cm simple cyst located within the left breast at the 12 o'clock position corresponding to the mammographic finding.  No findings worrisome for malignancy.       02/05/2015 Imaging    A 2.5 x 3.5 cm oval circumscribed mass within the upper retroareolar left breast is identified. No suspicious calcifications or distortion identified.  On physical exam, a firm palpable mobile mass is identified at the 11:30 position of the left breast 4 cm from the nipple. Targeted ultrasound is performed, showing a 2.8 x 1.4 x 3.6 cm simple cyst at the 11:30 position of the  left breast, corresponding to the screening study finding.      02/07/2016 Imaging    Targeted ultrasound of the left breast was performed demonstrating an irregular shadowing hypoechoic mass at 1 o'clock 5 cm from nipple measuring approximately 2.3 x 1 x 1.6 cm. This corresponds well with mammography findings. No lymphadenopathy seen in the left axilla.      02/07/2016 Mammogram    Note that the patient has a stable oval circumscribed mass in the retroareolar left breast previously characterized as a cyst. Although this cyst appears stable when compared to 2016 it is overall increasing in size and therefore given the patient's highly suspicious mass in the upper-outer left breast aspiration of the retroareolar left breast cyst is warranted.      02/07/2016 Procedure    She had US guided biopsy of breast mass      02/07/2016 Pathology Results    Accession: [id number]: Biopsy confirmed invasive breast cancer, ER 10% positive, PR 10% positive and Her2/neu negative, Ki 67 15%  Additional molecular study with MammaPrint result came back low risk (10% risk of recurrence in 10 years)      02/27/2016 Imaging    MR breast showed area of known malignancy in the upper-outer quadrant of the left breast, measuring maximum of 4.0 cm (anterior-posterior) an associated clip artifact. No findings to indicate multicentric, multifocal, or contralateral malignancy. No MRI evidence for adenopathy      03/03/2016 Surgery    She underwent left partial mastectomy and sentinel lymph node biopsy  03/03/2016 Pathology Results    CASE: [id number] Invasive lobular carcinoma 2.3 cm, Histologic Grade (Nottingham Histologic Score) Glandular (Acinar)/Tubular Differentiation: Score 3 Nuclear Pleomorphism: Score 2 Mitotic Rate: Score 1 Total score: 6 Overall Grade: Grade 2 Tumor Size: 23 mm. Size of lymph node invasion is 2.5 mm        Interval history- Patient is currently going through adjuvant  radiation and tolerating it well without any significant side effects. She has been experiencing hot flashes almost daily since she came off hormone therapy. She has been under significant stress due to recent death of her brother and her sister has been diagnosed with recurrent hodgkins. She has been using prn ativan for anxiety  ECOG PS- 1 Pain scale- 0 Opioid associated constipation- no  Review of systems- Review of Systems  Constitutional: Negative for chills, fever, malaise/fatigue and weight loss.  HENT: Negative for congestion, ear discharge and nosebleeds.   Eyes: Negative for blurred vision.  Respiratory: Negative for cough, hemoptysis, sputum production, shortness of breath and wheezing.   Cardiovascular: Negative for chest pain, palpitations, orthopnea and claudication.  Gastrointestinal: Negative for abdominal pain, blood in stool, constipation, diarrhea, heartburn, melena, nausea and vomiting.  Genitourinary: Negative for dysuria, flank pain, frequency, hematuria and urgency.  Musculoskeletal: Negative for back pain, joint pain and myalgias.  Skin: Negative for rash.  Neurological: Negative for dizziness, tingling, focal weakness, seizures, weakness and headaches.  Endo/Heme/Allergies: Does not bruise/bleed easily.  Psychiatric/Behavioral: Negative for depression and suicidal ideas. The patient is nervous/anxious. The patient does not have insomnia.     Current treatment- adjuvant radiation  Allergies  Allergen Reactions  . Penicillins Anaphylaxis    REACTION: severe anaphylaxis Has patient had a PCN reaction causing immediate rash, facial/tongue/throat swelling, SOB or lightheadedness with hypotension: Yes Has patient had a PCN reaction causing severe rash involving mucus membranes or skin necrosis: No Has patient had a PCN reaction that required hospitalization No Has patient had a PCN reaction occurring within the last 10 years: No If all of the above answers are "NO",  then may proceed with Cephalosporin use.   Marland Kitchen Zithromax [Azithromycin] Rash    z-pack = rash  . Cefuroxime Axetil Other (See Comments)    unknown  . Codeine Other (See Comments)    unknown  . Erythromycin Other (See Comments)    unknown  . Nitrofurantoin Other (See Comments)    unknown  . Ofloxacin Other (See Comments)    unknown  . Propoxyphene N-Acetaminophen Other (See Comments)    unknown  . Sulfonamide Derivatives Other (See Comments)    unknown  . Tetracycline Other (See Comments)    unknown     Past Medical History:  Diagnosis Date  . Anxiety   . Asthma   . Breast cancer ([place])   . CAD (coronary artery disease)    mild CAD by cath in 2001  . Depression   . Diverticulosis   . Heart murmur   . HTN (hypertension)   . Hyperlipidemia   . Palpitations   . Syncope    Pre-syncope     Past Surgical History:  Procedure Laterality Date  . ABDOMINAL HYSTERECTOMY    . BREAST BIOPSY    . CHOLECYSTECTOMY    . KNEE ARTHROSCOPY Right   . PARTIAL MASTECTOMY WITH NEEDLE LOCALIZATION Left 03/03/2016   Procedure: PARTIAL MASTECTOMY WITH NEEDLE LOCALIZATION;  Surgeon: Leonie Green, MD;  Location: [facility] ORS;  Service: General;  Laterality: Left;  . SENTINEL NODE BIOPSY Left  03/03/2016   Procedure: SENTINEL NODE BIOPSY;  Surgeon: Leonie Green, MD;  Location: [facility] ORS;  Service: General;  Laterality: Left;  . TUBAL LIGATION      Social History   Social History  . Marital status: Married    Spouse name: N/A  . Number of children: N/A  . Years of education: N/A   Occupational History  . Not on file.   Social History Main Topics  . Smoking status: Never Smoker  . Smokeless tobacco: Never Used  . Alcohol use No  . Drug use: No  . Sexual activity: Not on file   Other Topics Concern  . Not on file   Social History Narrative  . No narrative on file    Family History  Problem Relation Age of Onset  . Cancer Father     oral cancer  . Cancer Sister       colon cancer and lymphoma     Current Outpatient Prescriptions:  .  acetaminophen (TYLENOL) 500 MG tablet, Take 1,000 mg by mouth every 6 (six) hours as needed for mild pain., Disp: , Rfl:  .  amLODipine (NORVASC) 5 MG tablet, Take 5 mg by mouth daily.  , Disp: , Rfl:  .  fluticasone (FLONASE) 50 MCG/ACT nasal spray, Place 2 sprays into both nostrils daily., Disp: , Rfl:  .  hydrochlorothiazide (HYDRODIURIL) 25 MG tablet, Take 25 mg by mouth daily., Disp: , Rfl:  .  HYDROcodone-acetaminophen (NORCO) 5-325 MG tablet, Take 1-2 tablets by mouth every 4 (four) hours as needed for moderate pain., Disp: 12 tablet, Rfl: 0 .  montelukast (SINGULAIR) 10 MG tablet, Take 10 mg by mouth at bedtime., Disp: , Rfl:  .  sertraline (ZOLOFT) 100 MG tablet, Take 100 mg by mouth at bedtime., Disp: , Rfl:   Physical exam:  Vitals:   05/14/16 1339  BP: (!) 147/82  Pulse: 65  Resp: 18  Temp: (!) 96.3 F (35.7 C)  TempSrc: Tympanic  Weight: 187 lb 2.7 oz (84.9 kg)   Physical Exam  Constitutional: She is oriented to person, place, and time and well-developed, well-nourished, and in no distress.  HENT:  Head: Normocephalic and atraumatic.  Eyes: EOM are normal. Pupils are equal, round, and reactive to light.  Neck: Normal range of motion.  Cardiovascular: Normal rate, regular rhythm and normal heart sounds.   Pulmonary/Chest: Effort normal and breath sounds normal.  Abdominal: Soft. Bowel sounds are normal.  Neurological: She is alert and oriented to person, place, and time.  Skin: Skin is warm and dry.   Breast exam was performed in seated and lying down position. Patient is status post left lumpectomy with a well-healed surgical scar. No evidence of any palpable masses. No evidence of axillary adenopathy. No evidence of any palpable masses or lumps in the right breast. No evidence of right axillary adenopathy. Skin over left breast is slightly erythematous from ongoing radiation    CMP Latest Ref  Rng & Units 11/17/2011  Glucose 70 - 99 mg/dL 91  BUN 6 - 23 mg/dL 17  Creatinine 0.4 - 1.2 mg/dL 0.7  Sodium 135 - 145 mEq/L 139  Potassium 3.5 - 5.1 mEq/L 4.3  Chloride 96 - 112 mEq/L 102  CO2 19 - 32 mEq/L 27  Calcium 8.4 - 10.5 mg/dL 9.0  Total Protein 6.0 - 8.3 g/dL 7.2  Total Bilirubin 0.3 - 1.2 mg/dL 1.1  Alkaline Phos 39 - 117 U/L 71  AST 0 - 37 U/L 19  ALT 0 - 35 U/L 16   CBC Latest Ref Rng & Units 05/11/2016  WBC 3.6 - 11.0 K/uL 5.4  Hemoglobin 12.0 - 16.0 g/dL 13.1  Hematocrit 35.0 - 47.0 % 38.3  Platelets 150 - 440 K/uL 209    No images are attached to the encounter.  Dg Bone Density  Result Date: 05/05/2016 EXAM: DUAL X-RAY ABSORPTIOMETRY (DXA) FOR BONE MINERAL DENSITY IMPRESSION: Dear Dr. Heath Lark, Your patient Jamie Curry completed a BMD test on 05/05/2016 using the [place] (analysis version: 14.10) manufactured by [employer]. The following summarizes the results of our evaluation. PATIENT BIOGRAPHICAL: Name: Jamie, Curry Patient ID: [id number] Birth Date: March 11, 1946 Height: 64.0 in. Gender: Female Exam Date: 05/05/2016 Weight: 187.0 lbs. Indications: Advanced Age, Breast CA, Postmenopausal Fractures: Treatments: ASSESSMENT: The BMD measured at Femur Neck Left is 0.904 g/cm2 with a T-score of -1.0. This patient is considered normal according to [facility]) criteria. Site Region Measured Measured WHO Young Adult BMD Date       Age      Classification T-score AP Spine L1-L4 05/05/2016 71.1 Normal 1.6 1.398 g/cm2 DualFemur Neck Left 05/05/2016 71.1 Normal -1.0 0.904 g/cm2 World Health Organization [facility]) criteria for post-menopausal, Caucasian Women: Normal:       T-score at or above -1 SD Osteopenia:   T-score between -1 and -2.5 SD Osteoporosis: T-score at or below -2.5 SD RECOMMENDATIONS: [place] recommends that FDA-approved medical therapies be considered in postmenopausal women and men age 53 or older with a: 1.  Hip or vertebral (clinical or morphometric) fracture. 2. T-score of < -2.5 at the spine or hip. 3. Ten-year fracture probability by FRAX of 3% or greater for hip fracture or 20% or greater for major osteoporotic fracture. All treatment decisions require clinical judgment and consideration of individual patient factors, including patient preferences, co-morbidities, previous drug use, risk factors not captured in the FRAX model (e.g. falls, vitamin D deficiency, increased bone turnover, interval significant decline in bone density) and possible under - or over-estimation of fracture risk by FRAX. All patients should ensure an adequate intake of dietary calcium (1200 mg/d) and vitamin D (800 IU daily) unless contraindicated. FOLLOW-UP: People with diagnosed cases of osteoporosis or at high risk for fracture should have regular bone mineral density tests. For patients eligible for Medicare, routine testing is allowed once every 2 years. The testing frequency can be increased to one year for patients who have rapidly progressing disease, those who are receiving or discontinuing medical therapy to restore bone mass, or have additional risk factors. I have reviewed this report, and agree with the above findings. [facility] Electronically Signed   By: Lowella Grip III M.D.   On: 05/05/2016 09:09     Assessment and plan- Patient is a 71 y.o. female with a h/o Stage IIb pT2N1a(sn)cM0 invasive lobular carcinoma of the left breast ER 10% positive, PR 10% positive, HER-2/neu negative status post lumpectomy/SLND  1. I discussed the risks and benefits of hormone therapy with the patient today. Patient is weakly ER/PR positive and will still get some benefit of hormone therapy. Given that she has stage II disease and is postmenopausal with lobular histology, I would recommend hormone therapy with aromatase inhibitors over tamoxifen. I discussed the risks and benefits of aromatase inhibitors (anastozole)   including all but not limited to fatigue, arthralgias, worsening bone health and cardiovascular side effects such as hypercholesterolemia. Patient did have a baseline bone density scan on  05/05/2016 which showed a T score of -1 at the left femur neck and was considered normal by WHO criteria. Patient understands the risks and benefits of anastrozole and agrees to proceed as planned. She will continue to take calcium 1200 mg along with vitamin D 800 international units while she is on anastrozole.   We will be monitoring her bone density every other year. I would recommend total duration of hormone therapy for 10 years given that she has stage II disease if tolerated. Given that her mammogram score came back as low risk and patient did not require adjuvant chemotherapy and with a normal bone density,  I do not think the patient would benefit from adjuvant bisphosphonates.   Patient can start taking her anastrozole along after finishing radiation therapy in mid march 2018. I will see her back in 10 weeks' time to see how she is tolerating her treatment and I will check her CBC and Cmp at that time.   2. Hot flashes- I will speak to her pcp about changing her zoloft to citalopram 20 mg as it can help her with depression and hot flashes at the same time   Total face to face encounter time for this patient visit was 30 min. >50% of the time was  spent in counseling and coordination of care.      Visit Diagnosis 1. Malignant neoplasm of upper-outer quadrant of left breast in female, estrogen receptor positive ([place])   2. Hot flashes due to menopause      Dr. Randa Evens, MD, MPH [facility] at [facility] Pager- [phone] 05/14/2016  2:22 PM

## 2016-05-15 ENCOUNTER — Ambulatory Visit
Admission: RE | Admit: 2016-05-15 | Discharge: 2016-05-15 | Disposition: A | Discharge status: Home / Self Care | Payer: PPO | Source: Ambulatory Visit | Attending: Radiation Oncology | Admitting: Radiation Oncology

## 2016-05-15 DIAGNOSIS — Z510 Encounter for antineoplastic radiation therapy: Secondary | ICD-10-CM | POA: Diagnosis not present

## 2016-05-18 ENCOUNTER — Ambulatory Visit
Admission: RE | Admit: 2016-05-18 | Discharge: 2016-05-18 | Disposition: A | Discharge status: Home / Self Care | Payer: PPO | Source: Ambulatory Visit | Attending: Radiation Oncology | Admitting: Radiation Oncology

## 2016-05-18 DIAGNOSIS — Z510 Encounter for antineoplastic radiation therapy: Secondary | ICD-10-CM | POA: Diagnosis not present

## 2016-05-19 ENCOUNTER — Ambulatory Visit
Admission: RE | Admit: 2016-05-19 | Discharge: 2016-05-19 | Disposition: A | Discharge status: Home / Self Care | Payer: PPO | Source: Ambulatory Visit | Attending: Radiation Oncology | Admitting: Radiation Oncology

## 2016-05-19 DIAGNOSIS — Z510 Encounter for antineoplastic radiation therapy: Secondary | ICD-10-CM | POA: Diagnosis not present

## 2016-05-20 ENCOUNTER — Ambulatory Visit
Admission: RE | Admit: 2016-05-20 | Discharge: 2016-05-20 | Disposition: A | Discharge status: Home / Self Care | Payer: PPO | Source: Ambulatory Visit | Attending: Radiation Oncology | Admitting: Radiation Oncology

## 2016-05-20 DIAGNOSIS — Z170 Estrogen receptor positive status [ER+]: Secondary | ICD-10-CM | POA: Diagnosis not present

## 2016-05-20 DIAGNOSIS — Z510 Encounter for antineoplastic radiation therapy: Secondary | ICD-10-CM | POA: Diagnosis not present

## 2016-05-20 DIAGNOSIS — C50412 Malignant neoplasm of upper-outer quadrant of left female breast: Secondary | ICD-10-CM | POA: Diagnosis not present

## 2016-05-21 ENCOUNTER — Ambulatory Visit
Admission: RE | Admit: 2016-05-21 | Discharge: 2016-05-21 | Disposition: A | Discharge status: Home / Self Care | Payer: PPO | Source: Ambulatory Visit | Attending: Radiation Oncology | Admitting: Radiation Oncology

## 2016-05-21 ENCOUNTER — Ambulatory Visit: Payer: PPO

## 2016-05-21 DIAGNOSIS — Z510 Encounter for antineoplastic radiation therapy: Secondary | ICD-10-CM | POA: Diagnosis not present

## 2016-05-22 ENCOUNTER — Ambulatory Visit
Admission: RE | Admit: 2016-05-22 | Discharge: 2016-05-22 | Disposition: A | Discharge status: Home / Self Care | Payer: PPO | Source: Ambulatory Visit | Attending: Radiation Oncology | Admitting: Radiation Oncology

## 2016-05-22 DIAGNOSIS — Z510 Encounter for antineoplastic radiation therapy: Secondary | ICD-10-CM | POA: Diagnosis not present

## 2016-05-25 ENCOUNTER — Ambulatory Visit
Admission: RE | Admit: 2016-05-25 | Discharge: 2016-05-25 | Disposition: A | Discharge status: Home / Self Care | Payer: PPO | Source: Ambulatory Visit | Attending: Radiation Oncology | Admitting: Radiation Oncology

## 2016-05-25 ENCOUNTER — Inpatient Hospital Stay: Payer: PPO

## 2016-05-25 DIAGNOSIS — Z510 Encounter for antineoplastic radiation therapy: Secondary | ICD-10-CM | POA: Diagnosis not present

## 2016-05-25 DIAGNOSIS — C50412 Malignant neoplasm of upper-outer quadrant of left female breast: Secondary | ICD-10-CM

## 2016-05-25 LAB — CBC
HEMATOCRIT: 38.2 % (ref 35.0–47.0)
HEMOGLOBIN: 13.2 g/dL (ref 12.0–16.0)
MCH: 31.1 pg (ref 26.0–34.0)
MCHC: 34.5 g/dL (ref 32.0–36.0)
MCV: 90.1 fL (ref 80.0–100.0)
Platelets: 237 10*3/uL (ref 150–440)
RBC: 4.24 MIL/uL (ref 3.80–5.20)
RDW: 13.8 % (ref 11.5–14.5)
WBC: 5.2 10*3/uL (ref 3.6–11.0)

## 2016-05-26 ENCOUNTER — Ambulatory Visit
Admission: RE | Admit: 2016-05-26 | Discharge: 2016-05-26 | Disposition: A | Discharge status: Home / Self Care | Payer: PPO | Source: Ambulatory Visit | Attending: Radiation Oncology | Admitting: Radiation Oncology

## 2016-05-26 DIAGNOSIS — Z510 Encounter for antineoplastic radiation therapy: Secondary | ICD-10-CM | POA: Diagnosis not present

## 2016-05-27 ENCOUNTER — Telehealth: Payer: Self-pay | Admitting: *Deleted

## 2016-05-27 ENCOUNTER — Ambulatory Visit
Admission: RE | Admit: 2016-05-27 | Discharge: 2016-05-27 | Disposition: A | Discharge status: Home / Self Care | Payer: PPO | Source: Ambulatory Visit | Attending: Radiation Oncology | Admitting: Radiation Oncology

## 2016-05-27 DIAGNOSIS — Z170 Estrogen receptor positive status [ER+]: Secondary | ICD-10-CM | POA: Diagnosis not present

## 2016-05-27 DIAGNOSIS — Z510 Encounter for antineoplastic radiation therapy: Secondary | ICD-10-CM | POA: Diagnosis not present

## 2016-05-27 DIAGNOSIS — C50412 Malignant neoplasm of upper-outer quadrant of left female breast: Secondary | ICD-10-CM | POA: Diagnosis not present

## 2016-05-27 NOTE — Telephone Encounter (Signed)
Patient states she had not switched from zoloft to citalopram yet. She was glad because she got irritation from radiation but she will start it next week.  She will have her last radiation 3/7 and her appt with Janese Banks is in late April. I moved the appt up to 3/6. That is the day before her last radiation so that pt can get labs and see md.  This appt will be to start her on AI. Pt given appt 3/6 11:15 for labs and 11"30 to see md.  She is agreeable to the appt and saves her another visit.

## 2016-05-28 ENCOUNTER — Ambulatory Visit
Admission: RE | Admit: 2016-05-28 | Discharge: 2016-05-28 | Disposition: A | Discharge status: Home / Self Care | Payer: PPO | Source: Ambulatory Visit | Attending: Radiation Oncology | Admitting: Radiation Oncology

## 2016-05-28 DIAGNOSIS — C50412 Malignant neoplasm of upper-outer quadrant of left female breast: Secondary | ICD-10-CM | POA: Diagnosis not present

## 2016-05-28 DIAGNOSIS — Z510 Encounter for antineoplastic radiation therapy: Secondary | ICD-10-CM | POA: Diagnosis not present

## 2016-05-28 DIAGNOSIS — Z170 Estrogen receptor positive status [ER+]: Secondary | ICD-10-CM | POA: Diagnosis not present

## 2016-05-29 ENCOUNTER — Ambulatory Visit
Admission: RE | Admit: 2016-05-29 | Discharge: 2016-05-29 | Disposition: A | Discharge status: Home / Self Care | Payer: PPO | Source: Ambulatory Visit | Attending: Radiation Oncology | Admitting: Radiation Oncology

## 2016-05-29 DIAGNOSIS — Z510 Encounter for antineoplastic radiation therapy: Secondary | ICD-10-CM | POA: Diagnosis not present

## 2016-06-01 ENCOUNTER — Ambulatory Visit
Admission: RE | Admit: 2016-06-01 | Discharge: 2016-06-01 | Disposition: A | Discharge status: Home / Self Care | Payer: PPO | Source: Ambulatory Visit | Attending: Radiation Oncology | Admitting: Radiation Oncology

## 2016-06-01 DIAGNOSIS — Z510 Encounter for antineoplastic radiation therapy: Secondary | ICD-10-CM | POA: Diagnosis not present

## 2016-06-01 DIAGNOSIS — C50412 Malignant neoplasm of upper-outer quadrant of left female breast: Secondary | ICD-10-CM | POA: Diagnosis not present

## 2016-06-01 DIAGNOSIS — Z170 Estrogen receptor positive status [ER+]: Secondary | ICD-10-CM | POA: Diagnosis not present

## 2016-06-02 ENCOUNTER — Ambulatory Visit
Admission: RE | Admit: 2016-06-02 | Discharge: 2016-06-02 | Disposition: A | Discharge status: Home / Self Care | Payer: PPO | Source: Ambulatory Visit | Attending: Radiation Oncology | Admitting: Radiation Oncology

## 2016-06-02 DIAGNOSIS — Z510 Encounter for antineoplastic radiation therapy: Secondary | ICD-10-CM | POA: Diagnosis not present

## 2016-06-03 ENCOUNTER — Ambulatory Visit
Admission: RE | Admit: 2016-06-03 | Discharge: 2016-06-03 | Disposition: A | Discharge status: Home / Self Care | Payer: PPO | Source: Ambulatory Visit | Attending: Radiation Oncology | Admitting: Radiation Oncology

## 2016-06-03 DIAGNOSIS — Z510 Encounter for antineoplastic radiation therapy: Secondary | ICD-10-CM | POA: Diagnosis not present

## 2016-06-03 DIAGNOSIS — C50412 Malignant neoplasm of upper-outer quadrant of left female breast: Secondary | ICD-10-CM | POA: Diagnosis not present

## 2016-06-03 DIAGNOSIS — Z170 Estrogen receptor positive status [ER+]: Secondary | ICD-10-CM | POA: Diagnosis not present

## 2016-06-04 ENCOUNTER — Ambulatory Visit
Admission: RE | Admit: 2016-06-04 | Discharge: 2016-06-04 | Disposition: A | Discharge status: Home / Self Care | Payer: PPO | Source: Ambulatory Visit | Attending: Radiation Oncology | Admitting: Radiation Oncology

## 2016-06-04 DIAGNOSIS — Z510 Encounter for antineoplastic radiation therapy: Secondary | ICD-10-CM | POA: Diagnosis not present

## 2016-06-05 ENCOUNTER — Ambulatory Visit
Admission: RE | Admit: 2016-06-05 | Discharge: 2016-06-05 | Disposition: A | Discharge status: Home / Self Care | Payer: PPO | Source: Ambulatory Visit | Attending: Radiation Oncology | Admitting: Radiation Oncology

## 2016-06-05 DIAGNOSIS — Z510 Encounter for antineoplastic radiation therapy: Secondary | ICD-10-CM | POA: Diagnosis not present

## 2016-06-08 ENCOUNTER — Inpatient Hospital Stay: Payer: PPO

## 2016-06-08 ENCOUNTER — Other Ambulatory Visit: Payer: Self-pay | Admitting: *Deleted

## 2016-06-08 ENCOUNTER — Ambulatory Visit
Admission: RE | Admit: 2016-06-08 | Discharge: 2016-06-08 | Disposition: A | Discharge status: Home / Self Care | Payer: PPO | Source: Ambulatory Visit | Attending: Radiation Oncology | Admitting: Radiation Oncology

## 2016-06-08 ENCOUNTER — Ambulatory Visit: Payer: PPO

## 2016-06-08 DIAGNOSIS — Z510 Encounter for antineoplastic radiation therapy: Secondary | ICD-10-CM | POA: Diagnosis not present

## 2016-06-08 DIAGNOSIS — C50412 Malignant neoplasm of upper-outer quadrant of left female breast: Secondary | ICD-10-CM | POA: Diagnosis not present

## 2016-06-08 DIAGNOSIS — Z170 Estrogen receptor positive status [ER+]: Secondary | ICD-10-CM | POA: Diagnosis not present

## 2016-06-08 LAB — CBC
HEMATOCRIT: 36.8 % (ref 35.0–47.0)
HEMOGLOBIN: 12.8 g/dL (ref 12.0–16.0)
MCH: 31.3 pg (ref 26.0–34.0)
MCHC: 34.7 g/dL (ref 32.0–36.0)
MCV: 90.3 fL (ref 80.0–100.0)
Platelets: 214 10*3/uL (ref 150–440)
RBC: 4.07 MIL/uL (ref 3.80–5.20)
RDW: 14.1 % (ref 11.5–14.5)
WBC: 4.7 10*3/uL (ref 3.6–11.0)

## 2016-06-08 MED ORDER — SILVER SULFADIAZINE 1 % EX CREA
1.0000 | TOPICAL_CREAM | Freq: Two times a day (BID) | CUTANEOUS | 2 refills | Status: DC
Start: 2016-06-08 — End: 2018-05-12

## 2016-06-09 ENCOUNTER — Ambulatory Visit
Admission: RE | Admit: 2016-06-09 | Discharge: 2016-06-09 | Disposition: A | Discharge status: Home / Self Care | Payer: PPO | Source: Ambulatory Visit | Attending: Radiation Oncology | Admitting: Radiation Oncology

## 2016-06-09 DIAGNOSIS — Z510 Encounter for antineoplastic radiation therapy: Secondary | ICD-10-CM | POA: Diagnosis not present

## 2016-06-10 ENCOUNTER — Ambulatory Visit
Admission: RE | Admit: 2016-06-10 | Discharge: 2016-06-10 | Disposition: A | Discharge status: Home / Self Care | Payer: PPO | Source: Ambulatory Visit | Attending: Radiation Oncology | Admitting: Radiation Oncology

## 2016-06-10 DIAGNOSIS — C50412 Malignant neoplasm of upper-outer quadrant of left female breast: Secondary | ICD-10-CM | POA: Diagnosis not present

## 2016-06-10 DIAGNOSIS — Z170 Estrogen receptor positive status [ER+]: Secondary | ICD-10-CM | POA: Diagnosis not present

## 2016-06-10 DIAGNOSIS — Z510 Encounter for antineoplastic radiation therapy: Secondary | ICD-10-CM | POA: Diagnosis not present

## 2016-06-11 ENCOUNTER — Ambulatory Visit
Admission: RE | Admit: 2016-06-11 | Discharge: 2016-06-11 | Disposition: A | Discharge status: Home / Self Care | Payer: PPO | Source: Ambulatory Visit | Attending: Radiation Oncology | Admitting: Radiation Oncology

## 2016-06-11 DIAGNOSIS — Z510 Encounter for antineoplastic radiation therapy: Secondary | ICD-10-CM | POA: Diagnosis not present

## 2016-06-12 ENCOUNTER — Ambulatory Visit
Admission: RE | Admit: 2016-06-12 | Discharge: 2016-06-12 | Disposition: A | Discharge status: Home / Self Care | Payer: PPO | Source: Ambulatory Visit | Attending: Radiation Oncology | Admitting: Radiation Oncology

## 2016-06-12 DIAGNOSIS — Z510 Encounter for antineoplastic radiation therapy: Secondary | ICD-10-CM | POA: Diagnosis not present

## 2016-06-15 ENCOUNTER — Ambulatory Visit
Admission: RE | Admit: 2016-06-15 | Discharge: 2016-06-15 | Disposition: A | Discharge status: Home / Self Care | Payer: PPO | Source: Ambulatory Visit | Attending: Radiation Oncology | Admitting: Radiation Oncology

## 2016-06-15 ENCOUNTER — Ambulatory Visit: Payer: PPO

## 2016-06-15 DIAGNOSIS — Z853 Personal history of malignant neoplasm of breast: Secondary | ICD-10-CM | POA: Diagnosis not present

## 2016-06-15 DIAGNOSIS — Z510 Encounter for antineoplastic radiation therapy: Secondary | ICD-10-CM | POA: Diagnosis not present

## 2016-06-16 ENCOUNTER — Inpatient Hospital Stay: Payer: PPO

## 2016-06-16 ENCOUNTER — Ambulatory Visit: Payer: PPO

## 2016-06-16 ENCOUNTER — Inpatient Hospital Stay: Payer: PPO | Admitting: Oncology

## 2016-06-16 ENCOUNTER — Ambulatory Visit
Admission: RE | Admit: 2016-06-16 | Discharge: 2016-06-16 | Disposition: A | Discharge status: Home / Self Care | Payer: PPO | Source: Ambulatory Visit | Attending: Radiation Oncology | Admitting: Radiation Oncology

## 2016-06-16 DIAGNOSIS — Z510 Encounter for antineoplastic radiation therapy: Secondary | ICD-10-CM | POA: Diagnosis not present

## 2016-06-16 NOTE — Progress Notes (Deleted)
Hematology/Oncology Consult note [facility]  Telephone:([phone] [phone]  Patient Care Team: Marton Redwood, MD as PCP - General (Internal Medicine)   Name of the patient: Jamie Curry  [id number]  01/17/1946   Date of visit: 06/16/16  Diagnosis- ***  Chief complaint/ Reason for visit- ***  Heme/Onc history: ***  Interval history- ***  ECOG PS- *** Pain scale- *** Opioid associated constipation- ***  Review of systems- ROS   Current treatment- ***  Allergies  Allergen Reactions  . Penicillins Anaphylaxis    REACTION: severe anaphylaxis Has patient had a PCN reaction causing immediate rash, facial/tongue/throat swelling, SOB or lightheadedness with hypotension: Yes Has patient had a PCN reaction causing severe rash involving mucus membranes or skin necrosis: No Has patient had a PCN reaction that required hospitalization No Has patient had a PCN reaction occurring within the last 10 years: No If all of the above answers are "NO", then may proceed with Cephalosporin use.   Marland Kitchen Zithromax [Azithromycin] Rash    z-pack = rash  . Cefuroxime Axetil Other (See Comments)    unknown  . Codeine Other (See Comments)    unknown  . Erythromycin Other (See Comments)    unknown  . Nitrofurantoin Other (See Comments)    unknown  . Ofloxacin Other (See Comments)    unknown  . Propoxyphene N-Acetaminophen Other (See Comments)    unknown  . Sulfonamide Derivatives Other (See Comments)    unknown  . Tetracycline Other (See Comments)    unknown     Past Medical History:  Diagnosis Date  . Anxiety   . Asthma   . Breast cancer ([place])   . CAD (coronary artery disease)    mild CAD by cath in 2001  . Depression   . Diverticulosis   . Heart murmur   . HTN (hypertension)   . Hyperlipidemia   . Palpitations   . Syncope    Pre-syncope     Past Surgical History:  Procedure Laterality Date  . ABDOMINAL HYSTERECTOMY    . BREAST  BIOPSY    . CHOLECYSTECTOMY    . KNEE ARTHROSCOPY Right   . PARTIAL MASTECTOMY WITH NEEDLE LOCALIZATION Left 03/03/2016   Procedure: PARTIAL MASTECTOMY WITH NEEDLE LOCALIZATION;  Surgeon: Leonie Green, MD;  Location: [facility] ORS;  Service: General;  Laterality: Left;  . SENTINEL NODE BIOPSY Left 03/03/2016   Procedure: SENTINEL NODE BIOPSY;  Surgeon: Leonie Green, MD;  Location: [facility] ORS;  Service: General;  Laterality: Left;  . TUBAL LIGATION      Social History   Social History  . Marital status: Married    Spouse name: N/A  . Number of children: N/A  . Years of education: N/A   Occupational History  . Not on file.   Social History Main Topics  . Smoking status: Never Smoker  . Smokeless tobacco: Never Used  . Alcohol use No  . Drug use: No  . Sexual activity: Not on file   Other Topics Concern  . Not on file   Social History Narrative  . No narrative on file    Family History  Problem Relation Age of Onset  . Cancer Father     oral cancer  . Cancer Sister     colon cancer and lymphoma     Current Outpatient Prescriptions:  .  acetaminophen (TYLENOL) 500 MG tablet, Take 1,000 mg by mouth every 6 (six) hours as needed for mild pain., Disp: ,  Rfl:  .  ALPRAZolam (XANAX) 0.5 MG tablet, Take 0.5 mg by mouth at bedtime as needed for anxiety., Disp: , Rfl:  .  amLODipine (NORVASC) 5 MG tablet, Take 5 mg by mouth daily.  , Disp: , Rfl:  .  fluticasone (FLONASE) 50 MCG/ACT nasal spray, Place 2 sprays into both nostrils daily., Disp: , Rfl:  .  hydrochlorothiazide (HYDRODIURIL) 25 MG tablet, Take 25 mg by mouth daily., Disp: , Rfl:  .  HYDROcodone-acetaminophen (NORCO) 5-325 MG tablet, Take 1-2 tablets by mouth every 4 (four) hours as needed for moderate pain. (Patient not taking: Reported on 05/14/2016), Disp: 12 tablet, Rfl: 0 .  montelukast (SINGULAIR) 10 MG tablet, Take 10 mg by mouth at bedtime., Disp: , Rfl:  .  sertraline (ZOLOFT) 100 MG tablet, Take 100  mg by mouth at bedtime., Disp: , Rfl:  .  silver sulfADIAZINE (SILVADENE) 1 % cream, Apply 1 application topically 2 (two) times daily., Disp: 50 g, Rfl: 2  Physical exam: There were no vitals filed for this visit. Physical Exam   CMP Latest Ref Rng & Units 11/17/2011  Glucose 70 - 99 mg/dL 91  BUN 6 - 23 mg/dL 17  Creatinine 0.4 - 1.2 mg/dL 0.7  Sodium 135 - 145 mEq/L 139  Potassium 3.5 - 5.1 mEq/L 4.3  Chloride 96 - 112 mEq/L 102  CO2 19 - 32 mEq/L 27  Calcium 8.4 - 10.5 mg/dL 9.0  Total Protein 6.0 - 8.3 g/dL 7.2  Total Bilirubin 0.3 - 1.2 mg/dL 1.1  Alkaline Phos 39 - 117 U/L 71  AST 0 - 37 U/L 19  ALT 0 - 35 U/L 16   CBC Latest Ref Rng & Units 06/08/2016  WBC 3.6 - 11.0 K/uL 4.7  Hemoglobin 12.0 - 16.0 g/dL 12.8  Hematocrit 35.0 - 47.0 % 36.8  Platelets 150 - 440 K/uL 214    No images are attached to the encounter.  No results found.   Assessment and plan- Patient is a 71 y.o. female ***   Visit Diagnosis No diagnosis found.   Dr. Randa Evens, MD, MPH [place] at [facility] Pager- [phone] 06/16/2016 8:50 AM

## 2016-06-17 ENCOUNTER — Ambulatory Visit
Admission: RE | Admit: 2016-06-17 | Discharge: 2016-06-17 | Disposition: A | Discharge status: Home / Self Care | Payer: PPO | Source: Ambulatory Visit | Attending: Radiation Oncology | Admitting: Radiation Oncology

## 2016-06-17 DIAGNOSIS — Z170 Estrogen receptor positive status [ER+]: Secondary | ICD-10-CM | POA: Diagnosis not present

## 2016-06-17 DIAGNOSIS — C50412 Malignant neoplasm of upper-outer quadrant of left female breast: Secondary | ICD-10-CM | POA: Diagnosis not present

## 2016-06-17 DIAGNOSIS — Z510 Encounter for antineoplastic radiation therapy: Secondary | ICD-10-CM | POA: Diagnosis not present

## 2016-06-18 ENCOUNTER — Other Ambulatory Visit: Payer: Self-pay | Admitting: Oncology

## 2016-06-18 ENCOUNTER — Ambulatory Visit: Payer: PPO

## 2016-06-18 ENCOUNTER — Ambulatory Visit
Admission: RE | Admit: 2016-06-18 | Discharge: 2016-06-18 | Disposition: A | Discharge status: Home / Self Care | Payer: PPO | Source: Ambulatory Visit | Attending: Radiation Oncology | Admitting: Radiation Oncology

## 2016-06-18 DIAGNOSIS — Z510 Encounter for antineoplastic radiation therapy: Secondary | ICD-10-CM | POA: Diagnosis not present

## 2016-06-18 MED ORDER — ANASTROZOLE 1 MG PO TABS: 1.0000 mg | ORAL_TABLET | Freq: Every day | ORAL | 3 refills | Status: DC

## 2016-06-19 ENCOUNTER — Ambulatory Visit: Payer: PPO

## 2016-06-19 ENCOUNTER — Telehealth: Payer: Self-pay | Admitting: *Deleted

## 2016-06-19 NOTE — Telephone Encounter (Signed)
The pt called about if she go the right calcium pill or not. She got a combo calcium 630 mg and vit d 3-500 mg . I told her to take 1 pill twice a day. She verbalized understanding. She will start tom.

## 2016-06-30 ENCOUNTER — Telehealth: Payer: Self-pay | Admitting: *Deleted

## 2016-06-30 NOTE — Telephone Encounter (Signed)
I called pt and she described to me that it felt like abnormal heart beat and she checked her b/p and pulse and it was irregular. She states that she has carotid artery disease.  She has appt next week and she will talk to him and she has been taking a med for it.

## 2016-06-30 NOTE — Telephone Encounter (Signed)
It since 3/9. Only experienced this yest. I told her that I do not feel that this is a side effect from the medicine. Keep an eye on it and if it happens again then call. She states she does not eat right and because she has had abnl. Rate before and her carotid she will talk to her PCP next week.

## 2016-06-30 NOTE — Telephone Encounter (Signed)
Patient called wanting advice on ARIMIDEX reaction. Patient states that yesterday she felt "funny" for a brief period of time and had "a hard time getting her breath out" She has monitored her BP and HR which remain normal and has had no other occurrence of "reaction". I advised her to continue ARIMIDEX and that Dr. Rod Mae would contact her.

## 2016-07-03 DIAGNOSIS — C50919 Malignant neoplasm of unspecified site of unspecified female breast: Secondary | ICD-10-CM | POA: Diagnosis not present

## 2016-07-03 DIAGNOSIS — E782 Mixed hyperlipidemia: Secondary | ICD-10-CM | POA: Diagnosis not present

## 2016-07-03 DIAGNOSIS — R7301 Impaired fasting glucose: Secondary | ICD-10-CM | POA: Diagnosis not present

## 2016-07-03 DIAGNOSIS — I2510 Atherosclerotic heart disease of native coronary artery without angina pectoris: Secondary | ICD-10-CM | POA: Diagnosis not present

## 2016-07-03 DIAGNOSIS — Z800 Family history of malignant neoplasm of digestive organs: Secondary | ICD-10-CM | POA: Diagnosis not present

## 2016-07-03 DIAGNOSIS — Z0000 Encounter for general adult medical examination without abnormal findings: Secondary | ICD-10-CM | POA: Diagnosis not present

## 2016-07-03 DIAGNOSIS — Z6832 Body mass index (BMI) 32.0-32.9, adult: Secondary | ICD-10-CM | POA: Diagnosis not present

## 2016-07-03 DIAGNOSIS — I10 Essential (primary) hypertension: Secondary | ICD-10-CM | POA: Diagnosis not present

## 2016-07-03 DIAGNOSIS — E668 Other obesity: Secondary | ICD-10-CM | POA: Diagnosis not present

## 2016-07-03 DIAGNOSIS — I6521 Occlusion and stenosis of right carotid artery: Secondary | ICD-10-CM | POA: Diagnosis not present

## 2016-07-03 DIAGNOSIS — J45909 Unspecified asthma, uncomplicated: Secondary | ICD-10-CM | POA: Diagnosis not present

## 2016-07-03 DIAGNOSIS — Z1389 Encounter for screening for other disorder: Secondary | ICD-10-CM | POA: Diagnosis not present

## 2016-07-10 ENCOUNTER — Encounter: Payer: Self-pay | Admitting: *Deleted

## 2016-07-17 ENCOUNTER — Ambulatory Visit: Payer: PPO | Admitting: Oncology

## 2016-07-17 ENCOUNTER — Other Ambulatory Visit: Payer: PPO

## 2016-07-23 ENCOUNTER — Inpatient Hospital Stay: Payer: PPO | Attending: Oncology | Admitting: Oncology

## 2016-07-23 ENCOUNTER — Inpatient Hospital Stay: Payer: PPO

## 2016-07-23 ENCOUNTER — Ambulatory Visit: Payer: PPO | Admitting: Radiation Oncology

## 2016-07-23 ENCOUNTER — Encounter: Payer: Self-pay | Admitting: Oncology

## 2016-07-23 VITALS — BP 124/68 | HR 62 | Temp 96.4°F | Resp 18 | Wt 184.4 lb

## 2016-07-23 DIAGNOSIS — Z79811 Long term (current) use of aromatase inhibitors: Secondary | ICD-10-CM | POA: Diagnosis not present

## 2016-07-23 DIAGNOSIS — C50912 Malignant neoplasm of unspecified site of left female breast: Secondary | ICD-10-CM

## 2016-07-23 DIAGNOSIS — E785 Hyperlipidemia, unspecified: Secondary | ICD-10-CM | POA: Diagnosis not present

## 2016-07-23 DIAGNOSIS — Z9012 Acquired absence of left breast and nipple: Secondary | ICD-10-CM | POA: Insufficient documentation

## 2016-07-23 DIAGNOSIS — Z170 Estrogen receptor positive status [ER+]: Secondary | ICD-10-CM | POA: Insufficient documentation

## 2016-07-23 DIAGNOSIS — F329 Major depressive disorder, single episode, unspecified: Secondary | ICD-10-CM | POA: Insufficient documentation

## 2016-07-23 DIAGNOSIS — Z853 Personal history of malignant neoplasm of breast: Secondary | ICD-10-CM | POA: Insufficient documentation

## 2016-07-23 DIAGNOSIS — C50412 Malignant neoplasm of upper-outer quadrant of left female breast: Secondary | ICD-10-CM

## 2016-07-23 DIAGNOSIS — E7800 Pure hypercholesterolemia, unspecified: Secondary | ICD-10-CM | POA: Diagnosis not present

## 2016-07-23 DIAGNOSIS — J45909 Unspecified asthma, uncomplicated: Secondary | ICD-10-CM | POA: Diagnosis not present

## 2016-07-23 DIAGNOSIS — Z79899 Other long term (current) drug therapy: Secondary | ICD-10-CM | POA: Insufficient documentation

## 2016-07-23 DIAGNOSIS — I2510 Atherosclerotic heart disease of native coronary artery without angina pectoris: Secondary | ICD-10-CM | POA: Diagnosis not present

## 2016-07-23 DIAGNOSIS — I10 Essential (primary) hypertension: Secondary | ICD-10-CM | POA: Insufficient documentation

## 2016-07-23 DIAGNOSIS — F419 Anxiety disorder, unspecified: Secondary | ICD-10-CM | POA: Insufficient documentation

## 2016-07-23 NOTE — Progress Notes (Signed)
Hematology/Oncology Consult note [facility]  Telephone:([phone] [phone]  Patient Care Team: Marton Redwood, MD as PCP - General (Internal Medicine)   Name of the patient: Jamie Curry  [id number]  July 04, 1945   Date of visit: 07/23/16  Diagnosis- Stage IIb pT2N1a(sn)cM0 invasive lobular carcinoma of the left breast ER 10% positive, PR 10% positive, HER-2/neu negative status post lumpectomy/SLND   Chief complaint/ Reason for visit- routine f/u  Heme/Onc history:    Breast cancer of upper-outer quadrant of left female breast ([place])   02/15/2008 Imaging    Small  probable mildly complicated cysts within the upper outer quadrant right breast at the 11 o'clock position in the region of questionable nodularity noted on mammography.  Recommend follow-up right breast diagnostic mammogram and ultrasound in 6 months.         08/13/2008 Imaging    Ultrasound is performed, showing stable small minimal complicated cysts at the right breast 11 o'clock position 4 cm from the nipple, not changed compared prior exam      09/23/2010 Imaging    1.3 cm simple cyst located within the left breast at the 12 o'clock position corresponding to the mammographic finding.  No findings worrisome for malignancy.  Recommend annual screening mammography      09/23/2010 Mammogram    1.3 cm simple cyst located within the left breast at the 12 o'clock position corresponding to the mammographic finding.  No findings worrisome for malignancy.       02/05/2015 Imaging    A 2.5 x 3.5 cm oval circumscribed mass within the upper retroareolar left breast is identified. No suspicious calcifications or distortion identified.  On physical exam, a firm palpable mobile mass is identified at the 11:30 position of the left breast 4 cm from the nipple. Targeted ultrasound is performed, showing a 2.8 x 1.4 x 3.6 cm simple cyst at the 11:30 position of the left breast, corresponding to the  screening study finding.      02/07/2016 Imaging    Targeted ultrasound of the left breast was performed demonstrating an irregular shadowing hypoechoic mass at 1 o'clock 5 cm from nipple measuring approximately 2.3 x 1 x 1.6 cm. This corresponds well with mammography findings. No lymphadenopathy seen in the left axilla.      02/07/2016 Mammogram    Note that the patient has a stable oval circumscribed mass in the retroareolar left breast previously characterized as a cyst. Although this cyst appears stable when compared to 2016 it is overall increasing in size and therefore given the patient's highly suspicious mass in the upper-outer left breast aspiration of the retroareolar left breast cyst is warranted.      02/07/2016 Procedure    She had US guided biopsy of breast mass      02/07/2016 Pathology Results    Accession: [id number]: Biopsy confirmed invasive breast cancer, ER 10% positive, PR 10% positive and Her2/neu negative, Ki 67 15%  Additional molecular study with MammaPrint result came back low risk (10% risk of recurrence in 10 years)      02/27/2016 Imaging    MR breast showed area of known malignancy in the upper-outer quadrant of the left breast, measuring maximum of 4.0 cm (anterior-posterior) an associated clip artifact. No findings to indicate multicentric, multifocal, or contralateral malignancy. No MRI evidence for adenopathy      03/03/2016 Surgery    She underwent left partial mastectomy and sentinel lymph node biopsy      03/03/2016  Pathology Results    CASE: [id number] Invasive lobular carcinoma 2.3 cm, Histologic Grade (Nottingham Histologic Score) Glandular (Acinar)/Tubular Differentiation: Score 3 Nuclear Pleomorphism: Score 2 Mitotic Rate: Score 1 Total score: 6 Overall Grade: Grade 2 Tumor Size: 23 mm. Size of lymph node invasion is 2.5 mm      Anastrozole started in March 2018  Interval history- she is tolerating her anastrozole well without any  significant arthraligias. She was found to have high cholesterol by her pcp and she plans to restart her statin which caused her some joint pains in the past. She did have some irritation over the radiation site which is improving  ECOG PS- 1 Pain scale- 0 Opioid associated constipation- n/a  Review of systems- Review of Systems  Constitutional: Negative for chills, fever, malaise/fatigue and weight loss.  HENT: Negative for congestion, ear discharge and nosebleeds.   Eyes: Negative for blurred vision.  Respiratory: Negative for cough, hemoptysis, sputum production, shortness of breath and wheezing.   Cardiovascular: Negative for chest pain, palpitations, orthopnea and claudication.  Gastrointestinal: Negative for abdominal pain, blood in stool, constipation, diarrhea, heartburn, melena, nausea and vomiting.  Genitourinary: Negative for dysuria, flank pain, frequency, hematuria and urgency.  Musculoskeletal: Negative for back pain, joint pain and myalgias.  Skin: Negative for rash.  Neurological: Negative for dizziness, tingling, focal weakness, seizures, weakness and headaches.  Endo/Heme/Allergies: Does not bruise/bleed easily.  Psychiatric/Behavioral: Negative for depression and suicidal ideas. The patient does not have insomnia.      Current treatment- anastrozole  Allergies  Allergen Reactions  . Penicillins Anaphylaxis    REACTION: severe anaphylaxis Has patient had a PCN reaction causing immediate rash, facial/tongue/throat swelling, SOB or lightheadedness with hypotension: Yes Has patient had a PCN reaction causing severe rash involving mucus membranes or skin necrosis: No Has patient had a PCN reaction that required hospitalization No Has patient had a PCN reaction occurring within the last 10 years: No If all of the above answers are "NO", then may proceed with Cephalosporin use.   Marland Kitchen Zithromax [Azithromycin] Rash    z-pack = rash  . Cefuroxime Axetil Other (See Comments)      unknown  . Codeine Other (See Comments)    unknown  . Erythromycin Other (See Comments)    unknown  . Nitrofurantoin Other (See Comments)    unknown  . Ofloxacin Other (See Comments)    unknown  . Propoxyphene N-Acetaminophen Other (See Comments)    unknown  . Sulfonamide Derivatives Other (See Comments)    unknown  . Tetracycline Other (See Comments)    unknown     Past Medical History:  Diagnosis Date  . Anxiety   . Asthma   . Breast cancer ([place])   . CAD (coronary artery disease)    mild CAD by cath in 2001  . Depression   . Diverticulosis   . Heart murmur   . HTN (hypertension)   . Hyperlipidemia   . Palpitations   . Syncope    Pre-syncope     Past Surgical History:  Procedure Laterality Date  . ABDOMINAL HYSTERECTOMY    . BREAST BIOPSY    . CHOLECYSTECTOMY    . KNEE ARTHROSCOPY Right   . PARTIAL MASTECTOMY WITH NEEDLE LOCALIZATION Left 03/03/2016   Procedure: PARTIAL MASTECTOMY WITH NEEDLE LOCALIZATION;  Surgeon: Leonie Green, MD;  Location: [facility] ORS;  Service: General;  Laterality: Left;  . SENTINEL NODE BIOPSY Left 03/03/2016   Procedure: SENTINEL NODE BIOPSY;  Surgeon: Leonie Green,  MD;  Location: [facility] ORS;  Service: General;  Laterality: Left;  . TUBAL LIGATION      Social History   Social History  . Marital status: Married    Spouse name: N/A  . Number of children: N/A  . Years of education: N/A   Occupational History  . Not on file.   Social History Main Topics  . Smoking status: Never Smoker  . Smokeless tobacco: Never Used  . Alcohol use No  . Drug use: No  . Sexual activity: Not on file   Other Topics Concern  . Not on file   Social History Narrative  . No narrative on file    Family History  Problem Relation Age of Onset  . Cancer Father     oral cancer  . Cancer Sister     colon cancer and lymphoma     Current Outpatient Prescriptions:  .  acetaminophen (TYLENOL) 500 MG tablet, Take 1,000 mg by mouth  every 6 (six) hours as needed for mild pain., Disp: , Rfl:  .  ALPRAZolam (XANAX) 0.5 MG tablet, Take 0.5 mg by mouth at bedtime as needed for anxiety., Disp: , Rfl:  .  amLODipine (NORVASC) 5 MG tablet, Take 5 mg by mouth daily.  , Disp: , Rfl:  .  anastrozole (ARIMIDEX) 1 MG tablet, Take 1 tablet (1 mg total) by mouth daily., Disp: 30 tablet, Rfl: 3 .  atorvastatin (LIPITOR) 20 MG tablet, Take 20 mg by mouth daily., Disp: , Rfl:  .  Calcium Carb-Cholecalciferol (CALCIUM 500/D) 500-400 MG-UNIT CHEW, Chew 1,200 Units by mouth 2 (two) times daily., Disp: , Rfl:  .  fluticasone (FLONASE) 50 MCG/ACT nasal spray, Place 2 sprays into both nostrils daily., Disp: , Rfl:  .  hydrochlorothiazide (HYDRODIURIL) 25 MG tablet, Take 25 mg by mouth daily., Disp: , Rfl:  .  montelukast (SINGULAIR) 10 MG tablet, Take 10 mg by mouth at bedtime., Disp: , Rfl:  .  sertraline (ZOLOFT) 100 MG tablet, Take 100 mg by mouth at bedtime., Disp: , Rfl:  .  silver sulfADIAZINE (SILVADENE) 1 % cream, Apply 1 application topically 2 (two) times daily., Disp: 50 g, Rfl: 2 .  HYDROcodone-acetaminophen (NORCO) 5-325 MG tablet, Take 1-2 tablets by mouth every 4 (four) hours as needed for moderate pain. (Patient not taking: Reported on 05/14/2016), Disp: 12 tablet, Rfl: 0  Physical exam:  Vitals:   07/23/16 1012  BP: 124/68  Pulse: 62  Resp: 18  Temp: (!) 96.4 F (35.8 C)  TempSrc: Tympanic  Weight: 184 lb 7 oz (83.7 kg)   Physical Exam  Constitutional: She is oriented to person, place, and time and well-developed, well-nourished, and in no distress.  HENT:  Head: Normocephalic and atraumatic.  Eyes: EOM are normal. Pupils are equal, round, and reactive to light.  Neck: Normal range of motion.  Cardiovascular: Normal rate and regular rhythm.   Murmur (systolic murmur +) heard. Pulmonary/Chest: Effort normal and breath sounds normal.  Abdominal: Soft. Bowel sounds are normal.  Neurological: She is alert and oriented to  person, place, and time.  Skin: Skin is warm and dry.   small area of erythema noted over left breast. No infection  CMP Latest Ref Rng & Units 11/17/2011  Glucose 70 - 99 mg/dL 91  BUN 6 - 23 mg/dL 17  Creatinine 0.4 - 1.2 mg/dL 0.7  Sodium 135 - 145 mEq/L 139  Potassium 3.5 - 5.1 mEq/L 4.3  Chloride 96 - 112 mEq/L 102  CO2 19 - 32 mEq/L 27  Calcium 8.4 - 10.5 mg/dL 9.0  Total Protein 6.0 - 8.3 g/dL 7.2  Total Bilirubin 0.3 - 1.2 mg/dL 1.1  Alkaline Phos 39 - 117 U/L 71  AST 0 - 37 U/L 19  ALT 0 - 35 U/L 16   CBC Latest Ref Rng & Units 06/08/2016  WBC 3.6 - 11.0 K/uL 4.7  Hemoglobin 12.0 - 16.0 g/dL 12.8  Hematocrit 35.0 - 47.0 % 36.8  Platelets 150 - 440 K/uL 214    Assessment and plan- Patient is a 71 y.o. female with a h/o Stage IIb pT2N1a(sn)cM0 invasive lobular carcinoma of the left breast ER 10% positive, PR 10% positive, HER-2/neu negative status post lumpectomy/SLND currently on arimidex  Patient is tolerating her Arimidex well and will continue this for a total. A 5 is not 10 years. She will also continue her calcium and vitamin D supplements I will see her back in 3 months time. I did discuss the hypercholesterolemia could be potentially worsen with Arimidex and she needs to follow up with her primary care doctor regarding this.   Visit Diagnosis 1. Malignant neoplasm of upper-outer quadrant of left breast in female, estrogen receptor positive ([place])   2. Carcinoma of left breast treated with adjuvant hormone therapy ([place])      Dr. Randa Evens, MD, MPH [place] [facility] Pager- [phone] 07/23/2016 1:17 PM

## 2016-07-23 NOTE — Progress Notes (Signed)
Patient offers no complaints.  Patient has an appointment May 9th with vein and vascular in [facility] for [place] of carotids.  Recently placed on lipitor.  Patient brought her lab results today that she had drawn 2 weeks ago.

## 2016-07-24 DIAGNOSIS — C50112 Malignant neoplasm of central portion of left female breast: Secondary | ICD-10-CM | POA: Diagnosis not present

## 2016-07-24 DIAGNOSIS — Z4432 Encounter for fitting and adjustment of external left breast prosthesis: Secondary | ICD-10-CM | POA: Diagnosis not present

## 2016-07-28 ENCOUNTER — Ambulatory Visit
Admission: RE | Admit: 2016-07-28 | Discharge: 2016-07-28 | Disposition: A | Discharge status: Home / Self Care | Payer: PPO | Source: Ambulatory Visit | Attending: Radiation Oncology | Admitting: Radiation Oncology

## 2016-07-28 VITALS — BP 152/75 | HR 65 | Temp 97.8°F | Wt 186.1 lb

## 2016-07-28 DIAGNOSIS — C50412 Malignant neoplasm of upper-outer quadrant of left female breast: Secondary | ICD-10-CM | POA: Insufficient documentation

## 2016-07-28 DIAGNOSIS — Z170 Estrogen receptor positive status [ER+]: Secondary | ICD-10-CM | POA: Insufficient documentation

## 2016-07-28 DIAGNOSIS — Z923 Personal history of irradiation: Secondary | ICD-10-CM | POA: Diagnosis not present

## 2016-07-28 DIAGNOSIS — Z79811 Long term (current) use of aromatase inhibitors: Secondary | ICD-10-CM | POA: Insufficient documentation

## 2016-07-28 NOTE — Progress Notes (Signed)
Radiation Oncology Follow up Note  Name: Jamie Curry   Date:   07/28/2016 MRN:  [id number] DOB: 1946-03-14    This 71 y.o. female presents to the clinic today for one-month follow-up status post whole breast radiation for invasive lobular carcinoma mildly ER/PR positive status post wide local excision and sentinel node biopsy.Marland Kitchen  REFERRING PROVIDER: Marton Redwood, MD  HPI: Patient is a 71 year old female now out 1 month having completed whole breast radiation to her left breast for stage IIB invasive lobular carcinoma mildly ER/PR positive status post wide local excision and sentinel node biopsy. Seen today in routine follow-up she is doing well. She specifically denies breast tenderness cough or bone pain.. She has been started on Arimidex time that well without side effect  COMPLICATIONS OF TREATMENT: none  FOLLOW UP COMPLIANCE: keeps appointments   PHYSICAL EXAM:  BP (!) 152/75   Pulse 65   Temp 97.8 F (36.6 C)   Wt 186 lb 1.1 oz (84.4 kg)   BMI 31.94 kg/m  Lungs are clear to A&P cardiac examination essentially unremarkable with regular rate and rhythm. No dominant mass or nodularity is noted in either breast in 2 positions examined. Incision is well-healed. No axillary or supraclavicular adenopathy is appreciated. Cosmetic result is excellent. Well-developed well-nourished patient in NAD. HEENT reveals PERLA, EOMI, discs not visualized.  Oral cavity is clear. No oral mucosal lesions are identified. Neck is clear without evidence of cervical or supraclavicular adenopathy. Lungs are clear to A&P. Cardiac examination is essentially unremarkable with regular rate and rhythm without murmur rub or thrill. Abdomen is benign with no organomegaly or masses noted. Motor sensory and DTR levels are equal and symmetric in the upper and lower extremities. Cranial nerves II through XII are grossly intact. Proprioception is intact. No peripheral adenopathy or edema is identified. No motor or sensory  levels are noted. Crude visual fields are within normal range.  RADIOLOGY RESULTS: No current films for review  PLAN: Present time patient is doing well 1 month out. She's currently on antiestrogen therapy and tolerating that well. I've asked to see her back in 45 months for follow-up. Patient is to call sooner with any concerns.  I would like to take this opportunity to thank you for allowing me to participate in the care of your patient.Armstead Peaks., MD

## 2016-08-05 ENCOUNTER — Encounter: Payer: Self-pay | Admitting: Internal Medicine

## 2016-08-07 ENCOUNTER — Other Ambulatory Visit: Payer: PPO

## 2016-08-07 ENCOUNTER — Ambulatory Visit: Payer: PPO | Admitting: Oncology

## 2016-08-18 ENCOUNTER — Other Ambulatory Visit (HOSPITAL_COMMUNITY): Payer: Self-pay | Admitting: Internal Medicine

## 2016-08-18 DIAGNOSIS — I6521 Occlusion and stenosis of right carotid artery: Secondary | ICD-10-CM

## 2016-08-19 ENCOUNTER — Ambulatory Visit (INDEPENDENT_AMBULATORY_CARE_PROVIDER_SITE_OTHER): Payer: PPO

## 2016-08-19 ENCOUNTER — Ambulatory Visit (HOSPITAL_COMMUNITY)
Admission: RE | Admit: 2016-08-19 | Discharge: 2016-08-19 | Disposition: A | Discharge status: Home / Self Care | Payer: PPO | Source: Ambulatory Visit | Attending: Vascular Surgery | Admitting: Vascular Surgery

## 2016-08-19 ENCOUNTER — Encounter (INDEPENDENT_AMBULATORY_CARE_PROVIDER_SITE_OTHER): Payer: Self-pay | Admitting: Orthopaedic Surgery

## 2016-08-19 ENCOUNTER — Ambulatory Visit (INDEPENDENT_AMBULATORY_CARE_PROVIDER_SITE_OTHER): Payer: PPO | Admitting: Orthopaedic Surgery

## 2016-08-19 VITALS — Ht 64.0 in | Wt 186.0 lb

## 2016-08-19 DIAGNOSIS — I6521 Occlusion and stenosis of right carotid artery: Secondary | ICD-10-CM | POA: Diagnosis not present

## 2016-08-19 DIAGNOSIS — M25561 Pain in right knee: Secondary | ICD-10-CM | POA: Diagnosis not present

## 2016-08-19 DIAGNOSIS — M1711 Unilateral primary osteoarthritis, right knee: Secondary | ICD-10-CM

## 2016-08-19 DIAGNOSIS — M25562 Pain in left knee: Secondary | ICD-10-CM

## 2016-08-19 DIAGNOSIS — M1712 Unilateral primary osteoarthritis, left knee: Secondary | ICD-10-CM | POA: Diagnosis not present

## 2016-08-19 LAB — VAS US CAROTID
LCCAPSYS: 112 cm/s
LEFT ECA DIAS: -13 cm/s
LEFT VERTEBRAL DIAS: -11 cm/s
LICADDIAS: -20 cm/s
Left CCA dist dias: -22 cm/s
Left CCA dist sys: -78 cm/s
Left CCA prox dias: 28 cm/s
Left ICA dist sys: -71 cm/s
Left ICA prox dias: -24 cm/s
Left ICA prox sys: -95 cm/s
RCCADSYS: -112 cm/s
RCCAPSYS: 93 cm/s
RIGHT CCA MID DIAS: 18 cm/s
RIGHT ECA DIAS: 17 cm/s
RIGHT VERTEBRAL DIAS: -15 cm/s
Right CCA prox dias: 21 cm/s

## 2016-08-19 NOTE — Progress Notes (Signed)
Office Visit Note   Patient: Jamie Curry           Date of Birth: 12-05-1945           MRN: [id number] Visit Date: 08/19/2016              Requested by: Marton Redwood, MD [address] [place], [place] [address] PCP: Marton Redwood, MD   Assessment & Plan: Visit Diagnoses:  1. Acute pain of right knee   2. Acute pain of left knee   3. Unilateral primary osteoarthritis, left knee   4. Unilateral primary osteoarthritis, right knee     Plan: She tolerated the steroid injection in both knees. At this point she is definitely candidate again for hyaluronic acid so we'll see if we can get this ordered for place at her next visit. All questions were encouraged and answered.  Follow-Up Instructions: Return in about 4 weeks (around 09/16/2016).   Orders:  Orders Placed This Encounter  Procedures  . XR Knee 1-2 Views Right  . XR Knee 1-2 Views Left   No orders of the defined types were placed in this encounter.     Procedures: No procedures performed   Clinical Data: No additional findings.   Subjective: Chief Complaint  Patient presents with  . Right Knee - Pain    Hx of Right Knee Scope 07/18/15. Complains of bilateral knee pain R>L. After sitting for 30 minutes, difficulty getting up. Hx effusion.   . Left Knee - Pain  The patient is well-known to me. She has bilateral knee pain is gotten worse recently. In the past she has had hyaluronic acid injections. She is also had a right knee arthroscopy. Right now the pain is starting to the detrimental effect her mobility and activities daily living. She has had wrist cancer surgery since of seeing her last in dealing with the post treatment but doing well otherwise. She walks without any assistive device. Her pain is daily though.  HPI  Review of Systems She currently denies any fever, chills, short of breath, chest pain, nausea, vomiting, headache  Objective: Vital Signs: Ht 5\' 4"  (1.626 m)   Wt 186 lb (84.4 kg)   BMI  31.93 kg/m   Physical Exam He is alert and oriented 3 and in no acute distress Ortho Exam Examination both knee show mild effusion. There is mild varus deformity. She has medial lateral joint line tenderness and some slight patellofemoral crepitation. Both knees are ligamentously stable. Specialty Comments:  No specialty comments available.  Imaging: Xr Knee 1-2 Views Left  Result Date: 08/19/2016 An AP and lateral left knee show moderate arthritic changes at the patellofemoral joint and the medial compartment the knee with mild varus malalignment. There is no acute findings.  Xr Knee 1-2 Views Right  Result Date: 08/19/2016 An AP and lateral of the right knee show moderate arthritic changes at the medial compartment and the patellofemoral joint. There is a mild varus malalignment and no acute changes.    PMFS History: Patient Active Problem List   Diagnosis Date Noted  . Unilateral primary osteoarthritis, left knee 08/19/2016  . Unilateral primary osteoarthritis, right knee 08/19/2016  . Hot flashes 03/25/2016  . Breast cancer of upper-outer quadrant of left female breast ([place]) 02/14/2016  . Hyperlipidemia 11/12/2011  . HTN (hypertension) 12/29/2010  . Palpitations 12/29/2010   Past Medical History:  Diagnosis Date  . Anxiety   . Asthma   . Breast cancer ([place])   . CAD (  coronary artery disease)    mild CAD by cath in 2001  . Depression   . Diverticulosis   . Heart murmur   . HTN (hypertension)   . Hyperlipidemia   . Palpitations   . Syncope    Pre-syncope    Family History  Problem Relation Age of Onset  . Cancer Father     oral cancer  . Cancer Sister     colon cancer and lymphoma    Past Surgical History:  Procedure Laterality Date  . ABDOMINAL HYSTERECTOMY    . BREAST BIOPSY    . CHOLECYSTECTOMY    . KNEE ARTHROSCOPY Right   . PARTIAL MASTECTOMY WITH NEEDLE LOCALIZATION Left 03/03/2016   Procedure: PARTIAL MASTECTOMY WITH NEEDLE LOCALIZATION;  Surgeon:  Leonie Green, MD;  Location: [facility] ORS;  Service: General;  Laterality: Left;  . SENTINEL NODE BIOPSY Left 03/03/2016   Procedure: SENTINEL NODE BIOPSY;  Surgeon: Leonie Green, MD;  Location: [facility] ORS;  Service: General;  Laterality: Left;  . TUBAL LIGATION     Social History   Occupational History  . Not on file.   Social History Main Topics  . Smoking status: Never Smoker  . Smokeless tobacco: Never Used  . Alcohol use No  . Drug use: No  . Sexual activity: Not on file

## 2016-09-10 DIAGNOSIS — N8111 Cystocele, midline: Secondary | ICD-10-CM | POA: Diagnosis not present

## 2016-09-10 DIAGNOSIS — N816 Rectocele: Secondary | ICD-10-CM | POA: Diagnosis not present

## 2016-09-10 DIAGNOSIS — N941 Unspecified dyspareunia: Secondary | ICD-10-CM | POA: Diagnosis not present

## 2016-09-10 DIAGNOSIS — N952 Postmenopausal atrophic vaginitis: Secondary | ICD-10-CM | POA: Diagnosis not present

## 2016-09-10 DIAGNOSIS — Z1211 Encounter for screening for malignant neoplasm of colon: Secondary | ICD-10-CM | POA: Diagnosis not present

## 2016-09-11 ENCOUNTER — Other Ambulatory Visit: Payer: Self-pay | Admitting: Oncology

## 2016-09-14 DIAGNOSIS — Z7289 Other problems related to lifestyle: Secondary | ICD-10-CM | POA: Diagnosis not present

## 2016-09-14 DIAGNOSIS — S30860A Insect bite (nonvenomous) of lower back and pelvis, initial encounter: Secondary | ICD-10-CM | POA: Diagnosis not present

## 2016-09-14 DIAGNOSIS — Z6833 Body mass index (BMI) 33.0-33.9, adult: Secondary | ICD-10-CM | POA: Diagnosis not present

## 2016-09-14 DIAGNOSIS — W57XXXA Bitten or stung by nonvenomous insect and other nonvenomous arthropods, initial encounter: Secondary | ICD-10-CM | POA: Diagnosis not present

## 2016-09-16 ENCOUNTER — Ambulatory Visit (INDEPENDENT_AMBULATORY_CARE_PROVIDER_SITE_OTHER): Payer: Commercial Managed Care - HMO | Admitting: Orthopaedic Surgery

## 2016-09-21 ENCOUNTER — Encounter: Payer: Self-pay | Admitting: *Deleted

## 2016-09-21 ENCOUNTER — Telehealth: Payer: Self-pay | Admitting: *Deleted

## 2016-09-21 NOTE — Telephone Encounter (Addendum)
Spoke with patient - she reports-frequent "drenching night sweats x 3 months- progressively worse. She has to change her night gowns several times a night. She inquired if the hot flashes are associated with the Arimidex.  She states that her PCP changed her antidepressant from Zoloft to Celexa 20 mg daily at bedtime. (Celexa was added to pt's MAR as this was not previously noted on chart).   She would like to know if Dr. Janese Banks has any further recommendation.  ----- Message from Secundino Ginger sent at 09/21/2016  1:53 PM EDT -----  Regarding: medication/ hot flashes Contact: [phone] Jamie Curry I know this is Dr Janese Banks pt but she called today. She said she wanted to know if the medication she is on could be causing hot flashes, she said sweat is just running off of her face.

## 2016-09-21 NOTE — Telephone Encounter (Signed)
Hot flashes are assoictaed with arimidex. I would see how celexa is working. If after a week her symptoms are still bad, we can switch her from arimidex to aromasin and see if it helps.   Jamie Curry- can you call her in a weeks time and see how she is doing?

## 2016-09-23 ENCOUNTER — Telehealth: Payer: Self-pay | Admitting: *Deleted

## 2016-09-23 NOTE — Telephone Encounter (Signed)
What results

## 2016-09-23 NOTE — Telephone Encounter (Signed)
Patient would like a call regarding results.

## 2016-09-24 ENCOUNTER — Other Ambulatory Visit: Payer: Self-pay | Admitting: *Deleted

## 2016-09-24 MED ORDER — EXEMESTANE 25 MG PO TABS: 25.0000 mg | ORAL_TABLET | Freq: Every day | ORAL | 3 refills | Status: DC

## 2016-09-24 NOTE — Telephone Encounter (Signed)
Called pt and spoke to her.  She did not call about results she wanted to know what md said about her hot flashes.  I told her that I was off on Monday and I read her the comments and she wanted her to be on med for 3 months and she states that she started arimidex 3/6 and she was on the celexa jsut a couple of days of the arimidex so she has been on the meds for a little over 3 months.  In that case then we will switch her to the other AI aromasin.  She will just have to try it to see if the side effects are worse or better.  She will stop armidex for 3 days and then start the aromasin.  I will send it into her pharmacy.  All of the above was discussed with Dr. Janese Banks before advising pt.

## 2016-10-22 ENCOUNTER — Inpatient Hospital Stay: Payer: PPO | Attending: Oncology | Admitting: Oncology

## 2016-10-22 ENCOUNTER — Encounter: Payer: Self-pay | Admitting: Oncology

## 2016-10-22 VITALS — BP 125/65 | HR 66 | Temp 97.9°F | Resp 18 | Wt 184.9 lb

## 2016-10-22 DIAGNOSIS — R011 Cardiac murmur, unspecified: Secondary | ICD-10-CM | POA: Diagnosis not present

## 2016-10-22 DIAGNOSIS — I10 Essential (primary) hypertension: Secondary | ICD-10-CM

## 2016-10-22 DIAGNOSIS — F329 Major depressive disorder, single episode, unspecified: Secondary | ICD-10-CM | POA: Diagnosis not present

## 2016-10-22 DIAGNOSIS — C50412 Malignant neoplasm of upper-outer quadrant of left female breast: Secondary | ICD-10-CM

## 2016-10-22 DIAGNOSIS — E785 Hyperlipidemia, unspecified: Secondary | ICD-10-CM | POA: Diagnosis not present

## 2016-10-22 DIAGNOSIS — Z8719 Personal history of other diseases of the digestive system: Secondary | ICD-10-CM | POA: Insufficient documentation

## 2016-10-22 DIAGNOSIS — Z170 Estrogen receptor positive status [ER+]: Secondary | ICD-10-CM | POA: Insufficient documentation

## 2016-10-22 DIAGNOSIS — Z79899 Other long term (current) drug therapy: Secondary | ICD-10-CM

## 2016-10-22 DIAGNOSIS — R002 Palpitations: Secondary | ICD-10-CM | POA: Diagnosis not present

## 2016-10-22 DIAGNOSIS — R55 Syncope and collapse: Secondary | ICD-10-CM | POA: Insufficient documentation

## 2016-10-22 DIAGNOSIS — Z9012 Acquired absence of left breast and nipple: Secondary | ICD-10-CM | POA: Insufficient documentation

## 2016-10-22 DIAGNOSIS — J45909 Unspecified asthma, uncomplicated: Secondary | ICD-10-CM | POA: Diagnosis not present

## 2016-10-22 DIAGNOSIS — I2510 Atherosclerotic heart disease of native coronary artery without angina pectoris: Secondary | ICD-10-CM | POA: Insufficient documentation

## 2016-10-22 DIAGNOSIS — Z79811 Long term (current) use of aromatase inhibitors: Secondary | ICD-10-CM

## 2016-10-22 DIAGNOSIS — F419 Anxiety disorder, unspecified: Secondary | ICD-10-CM | POA: Insufficient documentation

## 2016-10-22 NOTE — Progress Notes (Signed)
Here for follow up pt stated she would like to see therapist she was told she could. Burna Sis Dr Janese Banks nurse informed to refer.

## 2016-10-22 NOTE — Progress Notes (Signed)
Hematology/Oncology Consult note [facility]  [phone] [phone]  Patient Care Team: Marton Redwood, MD as PCP - General (Internal Medicine)   Name of the patient: Jamie Curry  [id number]  11-Oct-1945   Date of visit: 10/22/16  Diagnosis-  Stage IIb pT2N1a(sn)cM0 invasive lobular carcinoma of the left breast ER 10% positive, PR 10% positive, HER-2/neu negative status post lumpectomy/SLND   Chief complaint/ Reason for visit- routine f/u  Heme/Onc history:    Breast cancer of upper-outer quadrant of left female breast ([place])   02/15/2008 Imaging    Small  probable mildly complicated cysts within the upper outer quadrant right breast at the 11 o'clock position in the region of questionable nodularity noted on mammography.  Recommend follow-up right breast diagnostic mammogram and ultrasound in 6 months.         08/13/2008 Imaging    Ultrasound is performed, showing stable small minimal complicated cysts at the right breast 11 o'clock position 4 cm from the nipple, not changed compared prior exam      09/23/2010 Imaging    1.3 cm simple cyst located within the left breast at the 12 o'clock position corresponding to the mammographic finding.  No findings worrisome for malignancy.  Recommend annual screening mammography      09/23/2010 Mammogram    1.3 cm simple cyst located within the left breast at the 12 o'clock position corresponding to the mammographic finding.  No findings worrisome for malignancy.       02/05/2015 Imaging    A 2.5 x 3.5 cm oval circumscribed mass within the upper retroareolar left breast is identified. No suspicious calcifications or distortion identified.  On physical exam, a firm palpable mobile mass is identified at the 11:30 position of the left breast 4 cm from the nipple. Targeted ultrasound is performed, showing a 2.8 x 1.4 x 3.6 cm simple cyst at the 11:30 position of the left breast, corresponding to  the screening study finding.      02/07/2016 Imaging    Targeted ultrasound of the left breast was performed demonstrating an irregular shadowing hypoechoic mass at 1 o'clock 5 cm from nipple measuring approximately 2.3 x 1 x 1.6 cm. This corresponds well with mammography findings. No lymphadenopathy seen in the left axilla.      02/07/2016 Mammogram    Note that the patient has a stable oval circumscribed mass in the retroareolar left breast previously characterized as a cyst. Although this cyst appears stable when compared to 2016 it is overall increasing in size and therefore given the patient's highly suspicious mass in the upper-outer left breast aspiration of the retroareolar left breast cyst is warranted.      02/07/2016 Procedure    She had US guided biopsy of breast mass      02/07/2016 Pathology Results    Accession: [id number]: Biopsy confirmed invasive breast cancer, ER 10% positive, PR 10% positive and Her2/neu negative, Ki 67 15%  Additional molecular study with MammaPrint result came back low risk (10% risk of recurrence in 10 years)      02/27/2016 Imaging    MR breast showed area of known malignancy in the upper-outer quadrant of the left breast, measuring maximum of 4.0 cm (anterior-posterior) an associated clip artifact. No findings to indicate multicentric, multifocal, or contralateral malignancy. No MRI evidence for adenopathy      03/03/2016 Surgery    She underwent left partial mastectomy and sentinel lymph node biopsy  03/03/2016 Pathology Results    CASE: [id number] Invasive lobular carcinoma 2.3 cm, Histologic Grade (Nottingham Histologic Score) Glandular (Acinar)/Tubular Differentiation: Score 3 Nuclear Pleomorphism: Score 2 Mitotic Rate: Score 1 Total score: 6 Overall Grade: Grade 2 Tumor Size: 23 mm. Size of lymph node invasion is 2.5 mm      Patient had intolerable hot flashes to arimidex and was switched to aromasin in June 2018  Interval  history- patient is tolerating aromasin well. Denies any hot flashes or arthralgias.   ECOG PS- 1 Pain scale- 0   Review of systems- Review of Systems  Constitutional: Negative for chills, fever, malaise/fatigue and weight loss.  HENT: Negative for congestion, ear discharge and nosebleeds.   Eyes: Negative for blurred vision.  Respiratory: Negative for cough, hemoptysis, sputum production, shortness of breath and wheezing.   Cardiovascular: Negative for chest pain, palpitations, orthopnea and claudication.  Gastrointestinal: Negative for abdominal pain, blood in stool, constipation, diarrhea, heartburn, melena, nausea and vomiting.  Genitourinary: Negative for dysuria, flank pain, frequency, hematuria and urgency.  Musculoskeletal: Negative for back pain, joint pain and myalgias.  Skin: Negative for rash.  Neurological: Negative for dizziness, tingling, focal weakness, seizures, weakness and headaches.  Endo/Heme/Allergies: Does not bruise/bleed easily.  Psychiatric/Behavioral: Negative for depression and suicidal ideas. The patient does not have insomnia.       Allergies  Allergen Reactions  . Penicillins Anaphylaxis    REACTION: severe anaphylaxis Has patient had a PCN reaction causing immediate rash, facial/tongue/throat swelling, SOB or lightheadedness with hypotension: Yes Has patient had a PCN reaction causing severe rash involving mucus membranes or skin necrosis: No Has patient had a PCN reaction that required hospitalization No Has patient had a PCN reaction occurring within the last 10 years: No If all of the above answers are "NO", then may proceed with Cephalosporin use.   Marland Kitchen Zithromax [Azithromycin] Rash    z-pack = rash  . Cefuroxime Axetil Other (See Comments)    unknown  . Codeine Other (See Comments)    unknown  . Erythromycin Other (See Comments)    unknown  . Nitrofurantoin Other (See Comments)    unknown  . Ofloxacin Other (See Comments)    unknown  .  Propoxyphene N-Acetaminophen Other (See Comments)    unknown  . Sulfonamide Derivatives Other (See Comments)    unknown  . Tetracycline Other (See Comments)    unknown     Past Medical History:  Diagnosis Date  . Anxiety   . Asthma   . Breast cancer ([place])   . CAD (coronary artery disease)    mild CAD by cath in 2001  . Depression   . Diverticulosis   . Heart murmur   . HTN (hypertension)   . Hyperlipidemia   . Palpitations   . Syncope    Pre-syncope     Past Surgical History:  Procedure Laterality Date  . ABDOMINAL HYSTERECTOMY    . BREAST BIOPSY    . CHOLECYSTECTOMY    . KNEE ARTHROSCOPY Right   . PARTIAL MASTECTOMY WITH NEEDLE LOCALIZATION Left 03/03/2016   Procedure: PARTIAL MASTECTOMY WITH NEEDLE LOCALIZATION;  Surgeon: Leonie Green, MD;  Location: [facility] ORS;  Service: General;  Laterality: Left;  . SENTINEL NODE BIOPSY Left 03/03/2016   Procedure: SENTINEL NODE BIOPSY;  Surgeon: Leonie Green, MD;  Location: [facility] ORS;  Service: General;  Laterality: Left;  . TUBAL LIGATION      Social History   Social History  . Marital status: Married  Spouse name: N/A  . Number of children: N/A  . Years of education: N/A   Occupational History  . Not on file.   Social History Main Topics  . Smoking status: Never Smoker  . Smokeless tobacco: Never Used  . Alcohol use No  . Drug use: No  . Sexual activity: Not on file   Other Topics Concern  . Not on file   Social History Narrative  . No narrative on file    Family History  Problem Relation Age of Onset  . Cancer Father        oral cancer  . Cancer Sister        colon cancer and lymphoma     Current Outpatient Prescriptions:  .  ALPRAZolam (XANAX) 0.5 MG tablet, Take 0.5 mg by mouth at bedtime as needed for anxiety., Disp: , Rfl:  .  amLODipine (NORVASC) 5 MG tablet, Take 5 mg by mouth daily.  , Disp: , Rfl:  .  atorvastatin (LIPITOR) 20 MG tablet, Take 20 mg by mouth daily., Disp: , Rfl:   .  Calcium Carb-Cholecalciferol (CALCIUM 500/D) 500-400 MG-UNIT CHEW, Chew 1,200 Units by mouth 2 (two) times daily., Disp: , Rfl:  .  citalopram (CELEXA) 20 MG tablet, Take 20 mg by mouth daily., Disp: , Rfl:  .  exemestane (AROMASIN) 25 MG tablet, Take 1 tablet (25 mg total) by mouth daily after breakfast., Disp: 30 tablet, Rfl: 3 .  fluticasone (FLONASE) 50 MCG/ACT nasal spray, Place 2 sprays into both nostrils daily., Disp: , Rfl:  .  hydrochlorothiazide (HYDRODIURIL) 25 MG tablet, Take 25 mg by mouth daily., Disp: , Rfl:  .  montelukast (SINGULAIR) 10 MG tablet, Take 10 mg by mouth at bedtime., Disp: , Rfl:  .  acetaminophen (TYLENOL) 500 MG tablet, Take 1,000 mg by mouth every 6 (six) hours as needed for mild pain., Disp: , Rfl:  .  silver sulfADIAZINE (SILVADENE) 1 % cream, Apply 1 application topically 2 (two) times daily. (Patient not taking: Reported on 10/22/2016), Disp: 50 g, Rfl: 2  Physical exam:  Vitals:   10/22/16 1038  BP: 125/65  Pulse: 66  Resp: 18  Temp: 97.9 F (36.6 C)  TempSrc: Tympanic  Weight: 184 lb 14.4 oz (83.9 kg)   Physical Exam  Constitutional: She is oriented to person, place, and time and well-developed, well-nourished, and in no distress.  HENT:  Head: Normocephalic and atraumatic.  Eyes: Pupils are equal, round, and reactive to light. EOM are normal.  Neck: Normal range of motion.  Cardiovascular: Normal rate, regular rhythm and normal heart sounds.   Pulmonary/Chest: Effort normal and breath sounds normal.  Abdominal: Soft. Bowel sounds are normal.  Neurological: She is alert and oriented to person, place, and time.  Skin: Skin is warm and dry.   Breast exam was performed in seated and lying down position. Patient is status post left lumpectomy with a well-healed surgical scar. No evidence of any palpable masses. No evidence of axillary adenopathy. No evidence of any palpable masses or lumps in the right breast. No evidence of right axillary  adenopathy   CMP Latest Ref Rng & Units 11/17/2011  Glucose 70 - 99 mg/dL 91  BUN 6 - 23 mg/dL 17  Creatinine 0.4 - 1.2 mg/dL 0.7  Sodium 135 - 145 mEq/L 139  Potassium 3.5 - 5.1 mEq/L 4.3  Chloride 96 - 112 mEq/L 102  CO2 19 - 32 mEq/L 27  Calcium 8.4 - 10.5 mg/dL 9.0  Total Protein 6.0 - 8.3 g/dL 7.2  Total Bilirubin 0.3 - 1.2 mg/dL 1.1  Alkaline Phos 39 - 117 U/L 71  AST 0 - 37 U/L 19  ALT 0 - 35 U/L 16   CBC Latest Ref Rng & Units 06/08/2016  WBC 3.6 - 11.0 K/uL 4.7  Hemoglobin 12.0 - 16.0 g/dL 12.8  Hematocrit 35.0 - 47.0 % 36.8  Platelets 150 - 440 K/uL 214     Assessment and plan- Patient is a 71 y.o. female with a h/o Stage IIb pT2N1a(sn)cM0 invasive lobular carcinoma of the left breast ER 10% positive, PR 10% positive, HER-2/neu negative status post lumpectomy/SLND currently on aromasin  Patient is tolerating aromasin well and will continue that for 5-10 years. Continue calcium and vitamin D. Clinically doing well. No evidence of recurrence on todays exam. rtc in 6 months with cbc, cmp. Mammograms to be coordinated by Dr. Tamala Julian    Visit Diagnosis 1. Malignant neoplasm of upper-outer quadrant of left breast in female, estrogen receptor positive ([place])      Dr. Randa Evens, MD, MPH [facility] Pager- [phone] 10/22/2016 4:29 PM

## 2016-10-30 ENCOUNTER — Encounter (HOSPITAL_COMMUNITY): Payer: Self-pay

## 2016-10-30 NOTE — Progress Notes (Signed)
Patient did not show for 11:00 AM appointment. Social worker telephoned patient and left message to request call back that she is okay.

## 2016-11-23 DIAGNOSIS — L720 Epidermal cyst: Secondary | ICD-10-CM | POA: Diagnosis not present

## 2016-11-23 DIAGNOSIS — I788 Other diseases of capillaries: Secondary | ICD-10-CM | POA: Diagnosis not present

## 2016-11-23 DIAGNOSIS — L570 Actinic keratosis: Secondary | ICD-10-CM | POA: Diagnosis not present

## 2016-11-23 DIAGNOSIS — D1800 Hemangioma unspecified site: Secondary | ICD-10-CM | POA: Diagnosis not present

## 2016-11-23 DIAGNOSIS — L812 Freckles: Secondary | ICD-10-CM | POA: Diagnosis not present

## 2016-11-23 DIAGNOSIS — L578 Other skin changes due to chronic exposure to nonionizing radiation: Secondary | ICD-10-CM | POA: Diagnosis not present

## 2016-11-23 DIAGNOSIS — Z85828 Personal history of other malignant neoplasm of skin: Secondary | ICD-10-CM | POA: Diagnosis not present

## 2016-11-24 DIAGNOSIS — R51 Headache: Secondary | ICD-10-CM | POA: Diagnosis not present

## 2016-12-17 ENCOUNTER — Other Ambulatory Visit: Payer: Self-pay | Admitting: Oncology

## 2016-12-21 ENCOUNTER — Other Ambulatory Visit: Payer: Self-pay | Admitting: *Deleted

## 2016-12-23 ENCOUNTER — Ambulatory Visit: Payer: PPO | Admitting: Radiation Oncology

## 2017-01-01 ENCOUNTER — Other Ambulatory Visit: Payer: Self-pay | Admitting: Surgery

## 2017-01-01 DIAGNOSIS — Z853 Personal history of malignant neoplasm of breast: Secondary | ICD-10-CM

## 2017-02-05 ENCOUNTER — Ambulatory Visit
Admission: RE | Admit: 2017-02-05 | Discharge: 2017-02-05 | Disposition: A | Discharge status: Home / Self Care | Payer: PPO | Source: Ambulatory Visit | Attending: Surgery | Admitting: Surgery

## 2017-02-05 DIAGNOSIS — Z853 Personal history of malignant neoplasm of breast: Secondary | ICD-10-CM

## 2017-02-05 DIAGNOSIS — Z923 Personal history of irradiation: Secondary | ICD-10-CM | POA: Insufficient documentation

## 2017-02-05 DIAGNOSIS — Z9889 Other specified postprocedural states: Secondary | ICD-10-CM | POA: Diagnosis not present

## 2017-02-05 DIAGNOSIS — R928 Other abnormal and inconclusive findings on diagnostic imaging of breast: Secondary | ICD-10-CM | POA: Diagnosis not present

## 2017-02-11 DIAGNOSIS — Z853 Personal history of malignant neoplasm of breast: Secondary | ICD-10-CM | POA: Diagnosis not present

## 2017-02-12 ENCOUNTER — Ambulatory Visit
Admission: RE | Admit: 2017-02-12 | Discharge: 2017-02-12 | Disposition: A | Discharge status: Home / Self Care | Payer: PPO | Source: Ambulatory Visit | Attending: Radiation Oncology | Admitting: Radiation Oncology

## 2017-02-12 ENCOUNTER — Encounter: Payer: Self-pay | Admitting: Radiation Oncology

## 2017-02-12 VITALS — BP 132/73 | HR 63 | Temp 96.6°F | Resp 20 | Wt 182.4 lb

## 2017-02-12 DIAGNOSIS — Z79811 Long term (current) use of aromatase inhibitors: Secondary | ICD-10-CM | POA: Insufficient documentation

## 2017-02-12 DIAGNOSIS — C50412 Malignant neoplasm of upper-outer quadrant of left female breast: Secondary | ICD-10-CM | POA: Diagnosis not present

## 2017-02-12 DIAGNOSIS — Z170 Estrogen receptor positive status [ER+]: Secondary | ICD-10-CM | POA: Insufficient documentation

## 2017-02-12 DIAGNOSIS — Z923 Personal history of irradiation: Secondary | ICD-10-CM | POA: Insufficient documentation

## 2017-02-12 NOTE — Progress Notes (Signed)
Radiation Oncology Follow up Note  Name: Jamie Curry   Date:   02/12/2017 MRN:  [id number] DOB: 1945-12-19    This 71 y.o. female presents to the clinic today for 5 month follow-up status post whole breast radiation to her left breast for lobular carcinoma mildly ER/PR positive.  REFERRING PROVIDER: Marton Redwood, MD  HPI: Patient is a 71 year old female now out 5 months have included whole breast radiation to her left breast for stage IIB invasive lobular carcinoma mildly ER/PR positive. She is seen today in routine follow-up is doing well she specifically denies breast tenderness cough or bone pain.. She had a mammogram last month which I have reviewed and is BI-RADS 2 benign. She is currently on Aromasin tolerating that well without side effect.  COMPLICATIONS OF TREATMENT: none  FOLLOW UP COMPLIANCE: keeps appointments   PHYSICAL EXAM:  BP 132/73   Pulse 63   Temp (!) 96.6 F (35.9 C)   Resp 20   Wt 182 lb 6.9 oz (82.7 kg)   BMI 31.31 kg/m  Lungs are clear to A&P cardiac examination essentially unremarkable with regular rate and rhythm. No dominant mass or nodularity is noted in either breast in 2 positions examined. Incision is well-healed. No axillary or supraclavicular adenopathy is appreciated. Cosmetic result is excellent. Well-developed well-nourished patient in NAD. HEENT reveals PERLA, EOMI, discs not visualized.  Oral cavity is clear. No oral mucosal lesions are identified. Neck is clear without evidence of cervical or supraclavicular adenopathy. Lungs are clear to A&P. Cardiac examination is essentially unremarkable with regular rate and rhythm without murmur rub or thrill. Abdomen is benign with no organomegaly or masses noted. Motor sensory and DTR levels are equal and symmetric in the upper and lower extremities. Cranial nerves II through XII are grossly intact. Proprioception is intact. No peripheral adenopathy or edema is identified. No motor or sensory levels are  noted. Crude visual fields are within normal range.  RADIOLOGY RESULTS: Mammograms are reviewed and compatible with the above-stated findings  PLAN: Present time she continues to do well with no evidence of disease. I've asked to see her out in 6 months for follow-up. She continues on Aromasin without side effect. Patient knows to call with any concerns.  I would like to take this opportunity to thank you for allowing me to participate in the care of your patient.Armstead Peaks., MD

## 2017-04-22 ENCOUNTER — Inpatient Hospital Stay: Payer: PPO | Attending: Oncology

## 2017-04-22 ENCOUNTER — Inpatient Hospital Stay (HOSPITAL_BASED_OUTPATIENT_CLINIC_OR_DEPARTMENT_OTHER): Payer: PPO | Admitting: Oncology

## 2017-04-22 ENCOUNTER — Other Ambulatory Visit: Payer: Self-pay

## 2017-04-22 ENCOUNTER — Encounter: Payer: Self-pay | Admitting: Oncology

## 2017-04-22 VITALS — BP 136/75 | HR 61 | Temp 97.6°F | Resp 18 | Wt 183.7 lb

## 2017-04-22 DIAGNOSIS — Z79811 Long term (current) use of aromatase inhibitors: Secondary | ICD-10-CM | POA: Diagnosis not present

## 2017-04-22 DIAGNOSIS — Z170 Estrogen receptor positive status [ER+]: Secondary | ICD-10-CM

## 2017-04-22 DIAGNOSIS — C50412 Malignant neoplasm of upper-outer quadrant of left female breast: Secondary | ICD-10-CM

## 2017-04-22 LAB — COMPREHENSIVE METABOLIC PANEL
ALBUMIN: 4.3 g/dL (ref 3.5–5.0)
ALT: 35 U/L (ref 14–54)
AST: 33 U/L (ref 15–41)
Alkaline Phosphatase: 110 U/L (ref 38–126)
Anion gap: 9 (ref 5–15)
BUN: 18 mg/dL (ref 6–20)
CO2: 29 mmol/L (ref 22–32)
Calcium: 9.6 mg/dL (ref 8.9–10.3)
Chloride: 98 mmol/L — ABNORMAL LOW (ref 101–111)
Creatinine, Ser: 0.77 mg/dL (ref 0.44–1.00)
GFR calc non Af Amer: >60 mL/min (ref >60)
Glucose, Bld: 122 mg/dL — ABNORMAL HIGH (ref 65–99)
Potassium: 3.6 mmol/L (ref 3.5–5.1)
SODIUM: 136 mmol/L (ref 135–145)
Total Bilirubin: 0.9 mg/dL (ref 0.3–1.2)
Total Protein: 7.9 g/dL (ref 6.5–8.1)

## 2017-04-22 LAB — CBC
HCT: 39.8 % (ref 35.0–47.0)
HEMOGLOBIN: 13.4 g/dL (ref 12.0–16.0)
MCH: 30.6 pg (ref 26.0–34.0)
MCHC: 33.6 g/dL (ref 32.0–36.0)
MCV: 91.3 fL (ref 80.0–100.0)
Platelets: 212 10*3/uL (ref 150–440)
RBC: 4.36 MIL/uL (ref 3.80–5.20)
RDW: 13.8 % (ref 11.5–14.5)
WBC: 4.6 10*3/uL (ref 3.6–11.0)

## 2017-04-22 NOTE — Progress Notes (Signed)
Hematology/Oncology Consult note [facility]  [phone] [phone]  Patient Care Team: Marton Redwood, MD as PCP - General (Internal Medicine)   Name of the patient: Jamie Curry  [id number]  June 18, 1945   Date of visit: 04/22/17  Diagnosis- Stage IIb pT2N1a(sn)cM0 invasive lobular carcinoma of the left breast ER 10% positive, PR 10% positive, HER-2/neu negative status post lumpectomy/SLND   Chief complaint/ Reason for visit- routine f/u of breast cancer  Heme/Onc history:    Breast cancer of upper-outer quadrant of left female breast ([place])   02/15/2008 Imaging    Small  probable mildly complicated cysts within the upper outer quadrant right breast at the 11 o'clock position in the region of questionable nodularity noted on mammography.  Recommend follow-up right breast diagnostic mammogram and ultrasound in 6 months.         08/13/2008 Imaging    Ultrasound is performed, showing stable small minimal complicated cysts at the right breast 11 o'clock position 4 cm from the nipple, not changed compared prior exam      09/23/2010 Imaging    1.3 cm simple cyst located within the left breast at the 12 o'clock position corresponding to the mammographic finding.  No findings worrisome for malignancy.  Recommend annual screening mammography      09/23/2010 Mammogram    1.3 cm simple cyst located within the left breast at the 12 o'clock position corresponding to the mammographic finding.  No findings worrisome for malignancy.       02/05/2015 Imaging    A 2.5 x 3.5 cm oval circumscribed mass within the upper retroareolar left breast is identified. No suspicious calcifications or distortion identified.  On physical exam, a firm palpable mobile mass is identified at the 11:30 position of the left breast 4 cm from the nipple. Targeted ultrasound is performed, showing a 2.8 x 1.4 x 3.6 cm simple cyst at the 11:30 position of the left breast,  corresponding to the screening study finding.      02/07/2016 Imaging    Targeted ultrasound of the left breast was performed demonstrating an irregular shadowing hypoechoic mass at 1 o'clock 5 cm from nipple measuring approximately 2.3 x 1 x 1.6 cm. This corresponds well with mammography findings. No lymphadenopathy seen in the left axilla.      02/07/2016 Mammogram    Note that the patient has a stable oval circumscribed mass in the retroareolar left breast previously characterized as a cyst. Although this cyst appears stable when compared to 2016 it is overall increasing in size and therefore given the patient's highly suspicious mass in the upper-outer left breast aspiration of the retroareolar left breast cyst is warranted.      02/07/2016 Procedure    She had US guided biopsy of breast mass      02/07/2016 Pathology Results    Accession: [id number]: Biopsy confirmed invasive breast cancer, ER 10% positive, PR 10% positive and Her2/neu negative, Ki 67 15%  Additional molecular study with MammaPrint result came back low risk (10% risk of recurrence in 10 years)      02/27/2016 Imaging    MR breast showed area of known malignancy in the upper-outer quadrant of the left breast, measuring maximum of 4.0 cm (anterior-posterior) an associated clip artifact. No findings to indicate multicentric, multifocal, or contralateral malignancy. No MRI evidence for adenopathy      03/03/2016 Surgery    She underwent left partial mastectomy and sentinel lymph node biopsy  03/03/2016 Pathology Results    CASE: [id number] Invasive lobular carcinoma 2.3 cm, Histologic Grade (Nottingham Histologic Score) Glandular (Acinar)/Tubular Differentiation: Score 3 Nuclear Pleomorphism: Score 2 Mitotic Rate: Score 1 Total score: 6 Overall Grade: Grade 2 Tumor Size: 23 mm. Size of lymph node invasion is 2.5 mm       Patient had intolerable hot flashes to arimidex and was switched to aromasin in June  2018   Interval history- tolerating aromasin well. Has mild self limited hot flashes  ECOG PS- 0 Pain scale- 0   Review of systems- Review of Systems  Constitutional: Negative for chills, fever, malaise/fatigue and weight loss.  HENT: Negative for congestion, ear discharge and nosebleeds.   Eyes: Negative for blurred vision.  Respiratory: Negative for cough, hemoptysis, sputum production, shortness of breath and wheezing.   Cardiovascular: Negative for chest pain, palpitations, orthopnea and claudication.  Gastrointestinal: Negative for abdominal pain, blood in stool, constipation, diarrhea, heartburn, melena, nausea and vomiting.  Genitourinary: Negative for dysuria, flank pain, frequency, hematuria and urgency.  Musculoskeletal: Negative for back pain, joint pain and myalgias.  Skin: Negative for rash.  Neurological: Negative for dizziness, tingling, focal weakness, seizures, weakness and headaches.  Endo/Heme/Allergies: Does not bruise/bleed easily.  Psychiatric/Behavioral: Negative for depression and suicidal ideas. The patient does not have insomnia.       Allergies  Allergen Reactions  . Penicillins Anaphylaxis    REACTION: severe anaphylaxis Has patient had a PCN reaction causing immediate rash, facial/tongue/throat swelling, SOB or lightheadedness with hypotension: Yes Has patient had a PCN reaction causing severe rash involving mucus membranes or skin necrosis: No Has patient had a PCN reaction that required hospitalization No Has patient had a PCN reaction occurring within the last 10 years: No If all of the above answers are "NO", then may proceed with Cephalosporin use.   Marland Kitchen Zithromax [Azithromycin] Rash    z-pack = rash  . Anastrozole Other (See Comments)    Extreme sweating  . Cefuroxime Axetil Other (See Comments)    unknown  . Cefuroxime Axetil Other (See Comments)  . Codeine Other (See Comments)    unknown  . Erythromycin Other (See Comments)    unknown    . Nitrofurantoin Other (See Comments)    unknown  . Ofloxacin Other (See Comments)    unknown  . Propoxyphene Other (See Comments)  . Propoxyphene N-Acetaminophen Other (See Comments)    unknown  . Sulfa Antibiotics Other (See Comments)  . Sulfonamide Derivatives Other (See Comments)    unknown  . Tetracycline Other (See Comments)    unknown     Past Medical History:  Diagnosis Date  . Anxiety   . Asthma   . Breast cancer ([place]) 02/07/2016   left breast  . CAD (coronary artery disease)    mild CAD by cath in 2001  . Depression   . Diverticulosis   . Heart murmur   . HTN (hypertension)   . Hyperlipidemia   . Palpitations   . Syncope    Pre-syncope     Past Surgical History:  Procedure Laterality Date  . ABDOMINAL HYSTERECTOMY    . BREAST BIOPSY Left 02/07/2016   positive  . BREAST LUMPECTOMY Left 03/03/2016   positive  . CHOLECYSTECTOMY    . KNEE ARTHROSCOPY Right   . PARTIAL MASTECTOMY WITH NEEDLE LOCALIZATION Left 03/03/2016   Procedure: PARTIAL MASTECTOMY WITH NEEDLE LOCALIZATION;  Surgeon: Leonie Green, MD;  Location: [facility] ORS;  Service: General;  Laterality: Left;  . SENTINEL  NODE BIOPSY Left 03/03/2016   Procedure: SENTINEL NODE BIOPSY;  Surgeon: Leonie Green, MD;  Location: [facility] ORS;  Service: General;  Laterality: Left;  . TUBAL LIGATION      Social History   Socioeconomic History  . Marital status: Married    Spouse name: Not on file  . Number of children: Not on file  . Years of education: Not on file  . Highest education level: Not on file  Social Needs  . Financial resource strain: Not on file  . Food insecurity - worry: Not on file  . Food insecurity - inability: Not on file  . Transportation needs - medical: Not on file  . Transportation needs - non-medical: Not on file  Occupational History  . Not on file  Tobacco Use  . Smoking status: Never Smoker  . Smokeless tobacco: Never Used  Substance and Sexual Activity  .  Alcohol use: No    Alcohol/week: 0.0 oz  . Drug use: No  . Sexual activity: Not on file  Other Topics Concern  . Not on file  Social History Narrative  . Not on file    Family History  Problem Relation Age of Onset  . Cancer Father        oral cancer  . Cancer Sister        colon cancer and lymphoma     Current Outpatient Medications:  .  acetaminophen (TYLENOL) 500 MG tablet, Take 1,000 mg by mouth every 6 (six) hours as needed for mild pain., Disp: , Rfl:  .  ALPRAZolam (XANAX) 0.5 MG tablet, Take 0.5 mg by mouth at bedtime as needed for anxiety., Disp: , Rfl:  .  amLODipine (NORVASC) 5 MG tablet, Take 5 mg by mouth daily.  , Disp: , Rfl:  .  atorvastatin (LIPITOR) 20 MG tablet, Take 20 mg by mouth daily., Disp: , Rfl:  .  Calcium Carb-Cholecalciferol (CALCIUM 500/D) 500-400 MG-UNIT CHEW, Chew 1,200 Units by mouth 2 (two) times daily., Disp: , Rfl:  .  citalopram (CELEXA) 20 MG tablet, Take 20 mg by mouth daily., Disp: , Rfl:  .  diclofenac (CATAFLAM) 50 MG tablet, , Disp: , Rfl:  .  exemestane (AROMASIN) 25 MG tablet, TAKE 1 TABLET BY MOUTH ONCE DAILY AFTER BREAKFAST, Disp: 90 tablet, Rfl: 1 .  fluticasone (FLONASE) 50 MCG/ACT nasal spray, Place 2 sprays into both nostrils daily., Disp: , Rfl:  .  hydrochlorothiazide (HYDRODIURIL) 25 MG tablet, Take 25 mg by mouth daily., Disp: , Rfl:  .  montelukast (SINGULAIR) 10 MG tablet, Take 10 mg by mouth at bedtime., Disp: , Rfl:  .  silver sulfADIAZINE (SILVADENE) 1 % cream, Apply 1 application topically 2 (two) times daily. (Patient not taking: Reported on 04/22/2017), Disp: 50 g, Rfl: 2  Physical exam:  Vitals:   04/22/17 1013  BP: 136/75  Pulse: 61  Resp: 18  Temp: 97.6 F (36.4 C)  TempSrc: Tympanic  Weight: 183 lb 11.2 oz (83.3 kg)   Physical Exam  Constitutional: She is oriented to person, place, and time and well-developed, well-nourished, and in no distress.  HENT:  Head: Normocephalic and atraumatic.  Eyes: EOM are  normal. Pupils are equal, round, and reactive to light.  Neck: Normal range of motion.  Cardiovascular: Normal rate, regular rhythm and normal heart sounds.  Pulmonary/Chest: Effort normal and breath sounds normal.  Abdominal: Soft. Bowel sounds are normal.  Neurological: She is alert and oriented to person, place, and time.  Skin:  Skin is warm and dry.   Breast exam was performed in seated and lying down position. Patient is status post left lumpectomy with a well-healed surgical scar. No evidence of any palpable masses. No evidence of axillary adenopathy. No evidence of any palpable masses or lumps in the right breast. No evidence of right axillary adenopathy   CMP Latest Ref Rng & Units 04/22/2017  Glucose 65 - 99 mg/dL 122(H)  BUN 6 - 20 mg/dL 18  Creatinine 0.44 - 1.00 mg/dL 0.77  Sodium 135 - 145 mmol/L 136  Potassium 3.5 - 5.1 mmol/L 3.6  Chloride 101 - 111 mmol/L 98(L)  CO2 22 - 32 mmol/L 29  Calcium 8.9 - 10.3 mg/dL 9.6  Total Protein 6.5 - 8.1 g/dL 7.9  Total Bilirubin 0.3 - 1.2 mg/dL 0.9  Alkaline Phos 38 - 126 U/L 110  AST 15 - 41 U/L 33  ALT 14 - 54 U/L 35   CBC Latest Ref Rng & Units 04/22/2017  WBC 3.6 - 11.0 K/uL 4.6  Hemoglobin 12.0 - 16.0 g/dL 13.4  Hematocrit 35.0 - 47.0 % 39.8  Platelets 150 - 440 K/uL 212      Assessment and plan- Patient is a 72 y.o. female a h/o Stage IIb pT2N1a(sn)cM0 invasive lobular carcinoma of the left breast ER 10% positive, PR 10% positive, HER-2/neu negative status post lumpectomy/SLND currently on aromasin  Patient is tolerating aromasin well and will continue that for atleast 5 years until 2022. Continue calcium and vitamin D. Clinically doing well. No evidence of recurrence on todays exam. rtc in 6 months. Recent mammogram from October 2018 was normal      Visit Diagnosis No diagnosis found.   Dr. Randa Evens, MD, MPH [place] at [facility] Pager- [phone] 04/22/2017 3:45 PM

## 2017-04-22 NOTE — Progress Notes (Signed)
Here for follow up  Doing well -still has some "sweating episodes "she stated . Intermittent she stated.

## 2017-05-03 ENCOUNTER — Other Ambulatory Visit (INDEPENDENT_AMBULATORY_CARE_PROVIDER_SITE_OTHER): Payer: Self-pay

## 2017-05-03 MED ORDER — DICLOFENAC POTASSIUM 50 MG PO TABS: 50.0000 mg | ORAL_TABLET | Freq: Two times a day (BID) | ORAL | 2 refills | Status: DC

## 2017-05-10 ENCOUNTER — Encounter (INDEPENDENT_AMBULATORY_CARE_PROVIDER_SITE_OTHER): Payer: Self-pay | Admitting: Orthopaedic Surgery

## 2017-05-10 ENCOUNTER — Ambulatory Visit (INDEPENDENT_AMBULATORY_CARE_PROVIDER_SITE_OTHER): Payer: PPO | Admitting: Orthopaedic Surgery

## 2017-05-10 VITALS — Ht 64.0 in | Wt 180.0 lb

## 2017-05-10 DIAGNOSIS — M1711 Unilateral primary osteoarthritis, right knee: Secondary | ICD-10-CM | POA: Diagnosis not present

## 2017-05-10 DIAGNOSIS — G8929 Other chronic pain: Secondary | ICD-10-CM

## 2017-05-10 DIAGNOSIS — M25561 Pain in right knee: Secondary | ICD-10-CM

## 2017-05-10 DIAGNOSIS — M25562 Pain in left knee: Secondary | ICD-10-CM

## 2017-05-10 DIAGNOSIS — M1712 Unilateral primary osteoarthritis, left knee: Secondary | ICD-10-CM | POA: Diagnosis not present

## 2017-05-10 NOTE — Progress Notes (Signed)
Office Visit Note   Patient: Jamie Curry           Date of Birth: 12-Sep-1945           MRN: [id number] Visit Date: 05/10/2017              Requested by: Marton Redwood, MD [address] [place], [place] [address] PCP: Marton Redwood, MD   Assessment & Plan: Visit Diagnoses:  1. Chronic pain of left knee   2. Chronic pain of right knee   3. Unilateral primary osteoarthritis, left knee   4. Unilateral primary osteoarthritis, right knee     Plan: I spoke with her about trying steroid injections in her knees today and she is definitely agreeable to this.  She said she is not ready for knee replacement surgery as of yet.  She tolerated steroid injections well.  We had a discussion of the risk and benefits of these.  I do feel that she is a candidate for hyaluronic acid for her knees and will see if we can order these for her knees and then see her back in a month to place the hyaluronic acid in both knees to treat her moderate arthritic pain.  All questions concerns were answered and addressed.  Follow-Up Instructions: Return in about 4 weeks (around 06/07/2017).   Orders:  Orders Placed This Encounter  Procedures  . Large Joint Inj   No orders of the defined types were placed in this encounter.     Procedures: Large Joint Inj: bilateral knee on 05/10/2017 8:30 AM Indications: diagnostic evaluation and pain Details: 22 G 1.5 in needle, superolateral approach  Arthrogram: No  Outcome: tolerated well, no immediate complications Procedure, treatment alternatives, risks and benefits explained, specific risks discussed. Consent was given by the patient. Immediately prior to procedure a time out was called to verify the correct patient, procedure, equipment, support staff and site/side marked as required. Patient was prepped and draped in the usual sterile fashion.       Clinical Data: No additional findings.   Subjective: Chief Complaint  Patient presents with  . Left  Knee - Pain  . Right Knee - Pain  Patient is a very pleasant 72 year old female was seen before.  She has known moderate arthritis in both her knees.  We last saw her in May of last year.  She has had a flareup of knee pain.  Is been a long time since she has had steroid injections.  She wonders if she has fluid in her knees that can be drawn off.  She is now living alone.  She does have a dog.  She has had no other acute change her medical status but her knees have been worsening lately in terms of pain.  She denies any locking catching or instability symptoms but it definitely hurts.  HPI  Review of Systems She currently denies any headache, chest pain, shortness of breath, fever, chills, nausea, vomiting.  She is alert and oriented x3 and in no acute distress  Objective: Vital Signs: Ht 5\' 4"  (1.626 m)   Wt 180 lb (81.6 kg)   BMI 30.90 kg/m   Physical Exam She is alert and oriented x3 and in no acute distress Ortho Exam Examination of both knees show no significant effusion.  Both knees have varus malalignment and global tenderness.  Knees have full range of motion are ligamentously stable.  Both knees have significant patellofemoral crepitation.   Specialty Comments:  No specialty comments  available.  Imaging: No results found. X-rays of her knees from last year showed moderate arthritic changes with mild varus malalignment with medial joint space narrowing and patellofemoral arthritic changes.  PMFS History: Patient Active Problem List   Diagnosis Date Noted  . Unilateral primary osteoarthritis, left knee 08/19/2016  . Unilateral primary osteoarthritis, right knee 08/19/2016  . Hot flashes 03/25/2016  . Breast cancer of upper-outer quadrant of left female breast ([place]) 02/14/2016  . Hyperlipidemia 11/12/2011  . HTN (hypertension) 12/29/2010  . Palpitations 12/29/2010   Past Medical History:  Diagnosis Date  . Anxiety   . Asthma   . Breast cancer ([place]) 02/07/2016   left  breast  . CAD (coronary artery disease)    mild CAD by cath in 2001  . Depression   . Diverticulosis   . Heart murmur   . HTN (hypertension)   . Hyperlipidemia   . Palpitations   . Syncope    Pre-syncope    Family History  Problem Relation Age of Onset  . Cancer Father        oral cancer  . Cancer Sister        colon cancer and lymphoma    Past Surgical History:  Procedure Laterality Date  . ABDOMINAL HYSTERECTOMY    . BREAST BIOPSY Left 02/07/2016   positive  . BREAST LUMPECTOMY Left 03/03/2016   positive  . CHOLECYSTECTOMY    . KNEE ARTHROSCOPY Right   . PARTIAL MASTECTOMY WITH NEEDLE LOCALIZATION Left 03/03/2016   Procedure: PARTIAL MASTECTOMY WITH NEEDLE LOCALIZATION;  Surgeon: Leonie Green, MD;  Location: [facility] ORS;  Service: General;  Laterality: Left;  . SENTINEL NODE BIOPSY Left 03/03/2016   Procedure: SENTINEL NODE BIOPSY;  Surgeon: Leonie Green, MD;  Location: [facility] ORS;  Service: General;  Laterality: Left;  . TUBAL LIGATION     Social History   Occupational History  . Not on file  Tobacco Use  . Smoking status: Never Smoker  . Smokeless tobacco: Never Used  Substance and Sexual Activity  . Alcohol use: No    Alcohol/week: 0.0 oz  . Drug use: No  . Sexual activity: Not on file

## 2017-05-11 ENCOUNTER — Telehealth (INDEPENDENT_AMBULATORY_CARE_PROVIDER_SITE_OTHER): Payer: Self-pay | Admitting: Orthopaedic Surgery

## 2017-05-11 NOTE — Telephone Encounter (Signed)
CB pt 

## 2017-05-11 NOTE — Telephone Encounter (Signed)
Jamie Curry called from [facility] wanting to speak with you.  I am not sure if it should go to you because she is Dr. Trevor Mace patient.  CB#[phone] ext [phone].  Thank you.

## 2017-05-11 NOTE — Telephone Encounter (Signed)
Just needed to confirm insurance

## 2017-05-20 ENCOUNTER — Telehealth (INDEPENDENT_AMBULATORY_CARE_PROVIDER_SITE_OTHER): Payer: Self-pay

## 2017-05-20 NOTE — Telephone Encounter (Signed)
Talked with patient and advised her that she has been approved for Monovisc Inj.for bilateral knee.  She will have a office visit co-pay of $20.00 and a 20% coinsurance with an estimated out of pocket amount of $350.00. Appt.is scheduled.

## 2017-05-31 DIAGNOSIS — L570 Actinic keratosis: Secondary | ICD-10-CM | POA: Diagnosis not present

## 2017-05-31 DIAGNOSIS — L720 Epidermal cyst: Secondary | ICD-10-CM | POA: Diagnosis not present

## 2017-05-31 DIAGNOSIS — L578 Other skin changes due to chronic exposure to nonionizing radiation: Secondary | ICD-10-CM | POA: Diagnosis not present

## 2017-05-31 DIAGNOSIS — I788 Other diseases of capillaries: Secondary | ICD-10-CM | POA: Diagnosis not present

## 2017-06-14 ENCOUNTER — Ambulatory Visit (INDEPENDENT_AMBULATORY_CARE_PROVIDER_SITE_OTHER): Payer: PPO | Admitting: Orthopaedic Surgery

## 2017-06-14 ENCOUNTER — Encounter (INDEPENDENT_AMBULATORY_CARE_PROVIDER_SITE_OTHER): Payer: Self-pay | Admitting: Orthopaedic Surgery

## 2017-06-14 DIAGNOSIS — M1711 Unilateral primary osteoarthritis, right knee: Secondary | ICD-10-CM

## 2017-06-14 DIAGNOSIS — M1712 Unilateral primary osteoarthritis, left knee: Secondary | ICD-10-CM | POA: Diagnosis not present

## 2017-06-14 NOTE — Progress Notes (Signed)
   Procedure Note  Patient: Jamie Curry             Date of Birth: November 28, 1945           MRN: [id number]             Visit Date: 06/14/2017  Procedures: Visit Diagnoses: Unilateral primary osteoarthritis, right knee  Unilateral primary osteoarthritis, left knee  Large Joint Inj: bilateral knee on 06/14/2017 8:20 AM Indications: pain and diagnostic evaluation Details: 22 G 1.5 in needle, superolateral approach  Arthrogram: No  Outcome: tolerated well, no immediate complications Procedure, treatment alternatives, risks and benefits explained, specific risks discussed. Consent was given by the patient. Immediately prior to procedure a time out was called to verify the correct patient, procedure, equipment, support staff and site/side marked as required. Patient was prepped and draped in the usual sterile fashion.     The patient has known moderate arthritis of both her knees.  She has had steroid injections in the past.  We are placing hyaluronic acid injections in her knees today to help decrease her osteoarthritic pain.  She understands fully the risk and benefits of these injections and the reasoning behind providing them.  On exam neither knee had any effusion today.  Both knees had some medial joint line tenderness and some patellofemoral crepitation but good motion overall.  Both knees are ligamentously stable.  She tolerated the Monovisc injections well in both knees.  She will follow-up as needed knowing that she can have a steroid injection if needed about 3 months.

## 2017-07-09 DIAGNOSIS — R82998 Other abnormal findings in urine: Secondary | ICD-10-CM | POA: Diagnosis not present

## 2017-07-09 DIAGNOSIS — E782 Mixed hyperlipidemia: Secondary | ICD-10-CM | POA: Diagnosis not present

## 2017-07-09 DIAGNOSIS — Z0000 Encounter for general adult medical examination without abnormal findings: Secondary | ICD-10-CM | POA: Diagnosis not present

## 2017-07-09 DIAGNOSIS — I8390 Asymptomatic varicose veins of unspecified lower extremity: Secondary | ICD-10-CM | POA: Diagnosis not present

## 2017-07-09 DIAGNOSIS — J45909 Unspecified asthma, uncomplicated: Secondary | ICD-10-CM | POA: Diagnosis not present

## 2017-07-09 DIAGNOSIS — I2510 Atherosclerotic heart disease of native coronary artery without angina pectoris: Secondary | ICD-10-CM | POA: Diagnosis not present

## 2017-07-09 DIAGNOSIS — R7301 Impaired fasting glucose: Secondary | ICD-10-CM | POA: Diagnosis not present

## 2017-07-09 DIAGNOSIS — I6521 Occlusion and stenosis of right carotid artery: Secondary | ICD-10-CM | POA: Diagnosis not present

## 2017-07-09 DIAGNOSIS — E668 Other obesity: Secondary | ICD-10-CM | POA: Diagnosis not present

## 2017-07-09 DIAGNOSIS — I10 Essential (primary) hypertension: Secondary | ICD-10-CM | POA: Diagnosis not present

## 2017-07-09 DIAGNOSIS — C50919 Malignant neoplasm of unspecified site of unspecified female breast: Secondary | ICD-10-CM | POA: Diagnosis not present

## 2017-07-09 DIAGNOSIS — Z6832 Body mass index (BMI) 32.0-32.9, adult: Secondary | ICD-10-CM | POA: Diagnosis not present

## 2017-07-09 DIAGNOSIS — F3342 Major depressive disorder, recurrent, in full remission: Secondary | ICD-10-CM | POA: Diagnosis not present

## 2017-07-19 ENCOUNTER — Other Ambulatory Visit: Payer: Self-pay | Admitting: Oncology

## 2017-08-10 ENCOUNTER — Encounter: Payer: Self-pay | Admitting: Internal Medicine

## 2017-08-12 DIAGNOSIS — H40003 Preglaucoma, unspecified, bilateral: Secondary | ICD-10-CM | POA: Diagnosis not present

## 2017-08-25 ENCOUNTER — Other Ambulatory Visit: Payer: Self-pay

## 2017-08-25 ENCOUNTER — Ambulatory Visit
Admission: RE | Admit: 2017-08-25 | Discharge: 2017-08-25 | Disposition: A | Discharge status: Home / Self Care | Payer: PPO | Source: Ambulatory Visit | Attending: Radiation Oncology | Admitting: Radiation Oncology

## 2017-08-25 ENCOUNTER — Encounter: Payer: Self-pay | Admitting: Radiation Oncology

## 2017-08-25 VITALS — BP 120/72 | HR 63 | Temp 97.3°F | Resp 18 | Wt 183.0 lb

## 2017-08-25 DIAGNOSIS — C50412 Malignant neoplasm of upper-outer quadrant of left female breast: Secondary | ICD-10-CM

## 2017-08-25 DIAGNOSIS — Z79811 Long term (current) use of aromatase inhibitors: Secondary | ICD-10-CM | POA: Diagnosis not present

## 2017-08-25 DIAGNOSIS — Z923 Personal history of irradiation: Secondary | ICD-10-CM | POA: Diagnosis not present

## 2017-08-25 DIAGNOSIS — Z170 Estrogen receptor positive status [ER+]: Secondary | ICD-10-CM | POA: Insufficient documentation

## 2017-08-25 NOTE — Progress Notes (Signed)
Radiation Oncology Follow up Note  Name: Jamie Curry   Date:   08/25/2017 MRN:  [id number] DOB: 13-Aug-1945    This 72 y.o. female presents to the clinic today for 1 year follow-up status post whole breast radiation to her left breast for lobular carcinoma mildly ER/PR positive.Marland Kitchen  REFERRING PROVIDER: Marton Redwood, MD  HPI: patient is a 72 year old female now seen out 1 year having completed whole breast radiation to her left breast for lobular carcinoma mildly ER/PR positive status post wide local excision. She is seen today in routine follow-up and is doing well. She specifically denies breast tenderness cough or bone pain..her last mammogram which I have reviewed was back iher last mammogram which I have reviewed was back in October 2018 BI-RADS 2 benign.she is currently on Aromasin tolerating that well without side effect.  COMPLICATIONS OF TREATMENT: none  FOLLOW UP COMPLIANCE: keeps appointments   PHYSICAL EXAM:  BP 120/72   Pulse 63   Temp (!) 97.3 F (36.3 C)   Resp 18   Wt 182 lb 15.7 oz (83 kg)   BMI 31.41 kg/m  Lungs are clear to A&P cardiac examination essentially unremarkable with regular rate and rhythm. No dominant mass or nodularity is noted in either breast in 2 positions examined. Incision is well-healed. No axillary or supraclavicular adenopathy is appreciated. Cosmetic result is excellent. Well-developed well-nourished patient in NAD. HEENT reveals PERLA, EOMI, discs not visualized.  Oral cavity is clear. No oral mucosal lesions are identified. Neck is clear without evidence of cervical or supraclavicular adenopathy. Lungs are clear to A&P. Cardiac examination is essentially unremarkable with regular rate and rhythm without murmur rub or thrill. Abdomen is benign with no organomegaly or masses noted. Motor sensory and DTR levels are equal and symmetric in the upper and lower extremities. Cranial nerves II through XII are grossly intact. Proprioception is intact. No  peripheral adenopathy or edema is identified. No motor or sensory levels are noted. Crude visual fields are within normal range.  RADIOLOGY RESULTS: last mammogram reviewed was reviewed and compatible with the above-stated findings  PLAN: resent time she continues to do well with no evidence of disease. I've asked to see her back in 1 year for follow-up. Patient is to call with any concerns at any time. She continues on Aromasin without side effect.  I would like to take this opportunity to thank you for allowing me to participate in the care of your patient.Noreene Filbert, MD

## 2017-10-20 DIAGNOSIS — R0981 Nasal congestion: Secondary | ICD-10-CM | POA: Diagnosis not present

## 2017-10-20 DIAGNOSIS — H902 Conductive hearing loss, unspecified: Secondary | ICD-10-CM | POA: Diagnosis not present

## 2017-10-20 DIAGNOSIS — H6123 Impacted cerumen, bilateral: Secondary | ICD-10-CM | POA: Diagnosis not present

## 2017-10-28 ENCOUNTER — Inpatient Hospital Stay: Payer: PPO | Attending: Oncology | Admitting: Oncology

## 2017-10-28 ENCOUNTER — Encounter: Payer: Self-pay | Admitting: Oncology

## 2017-10-28 ENCOUNTER — Other Ambulatory Visit: Payer: Self-pay

## 2017-10-28 VITALS — BP 131/74 | HR 67 | Temp 96.3°F | Resp 18 | Wt 183.3 lb

## 2017-10-28 DIAGNOSIS — Z170 Estrogen receptor positive status [ER+]: Secondary | ICD-10-CM | POA: Insufficient documentation

## 2017-10-28 DIAGNOSIS — Z79811 Long term (current) use of aromatase inhibitors: Secondary | ICD-10-CM | POA: Diagnosis not present

## 2017-10-28 DIAGNOSIS — R232 Flushing: Secondary | ICD-10-CM

## 2017-10-28 DIAGNOSIS — Z08 Encounter for follow-up examination after completed treatment for malignant neoplasm: Secondary | ICD-10-CM

## 2017-10-28 DIAGNOSIS — C50412 Malignant neoplasm of upper-outer quadrant of left female breast: Secondary | ICD-10-CM | POA: Diagnosis not present

## 2017-10-28 DIAGNOSIS — N951 Menopausal and female climacteric states: Secondary | ICD-10-CM | POA: Diagnosis not present

## 2017-10-28 DIAGNOSIS — M8580 Other specified disorders of bone density and structure, unspecified site: Secondary | ICD-10-CM | POA: Insufficient documentation

## 2017-10-28 DIAGNOSIS — Z853 Personal history of malignant neoplasm of breast: Secondary | ICD-10-CM

## 2017-10-28 MED ORDER — PREGABALIN 75 MG PO CAPS: 75.0000 mg | ORAL_CAPSULE | Freq: Two times a day (BID) | ORAL | 2 refills | Status: DC

## 2017-10-28 NOTE — Progress Notes (Signed)
Here for follow up. Overall stated she is doing well.  

## 2017-10-28 NOTE — Progress Notes (Signed)
Hematology/Oncology Consult note [facility]  [phone] [phone]  Patient Care Team: Marton Redwood, MD as PCP - General (Internal Medicine)   Name of the patient: Jamie Curry  [id number]  1945-08-01   Date of visit: 10/28/17  Diagnosis- Stage IIb pT2N1a(sn)cM0 invasive lobular carcinoma of the left breast ER 10% positive, PR 10% positive, HER-2/neu negative status post lumpectomy/SLND    Chief complaint/ Reason for visit- routine f/u of breast cancer  Heme/Onc history:    Breast cancer of upper-outer quadrant of left female breast ([place])   02/15/2008 Imaging    Small  probable mildly complicated cysts within the upper outer quadrant right breast at the 11 o'clock position in the region of questionable nodularity noted on mammography.  Recommend follow-up right breast diagnostic mammogram and ultrasound in 6 months.         08/13/2008 Imaging    Ultrasound is performed, showing stable small minimal complicated cysts at the right breast 11 o'clock position 4 cm from the nipple, not changed compared prior exam      09/23/2010 Imaging    1.3 cm simple cyst located within the left breast at the 12 o'clock position corresponding to the mammographic finding.  No findings worrisome for malignancy.  Recommend annual screening mammography      09/23/2010 Mammogram    1.3 cm simple cyst located within the left breast at the 12 o'clock position corresponding to the mammographic finding.  No findings worrisome for malignancy.       02/05/2015 Imaging    A 2.5 x 3.5 cm oval circumscribed mass within the upper retroareolar left breast is identified. No suspicious calcifications or distortion identified.  On physical exam, a firm palpable mobile mass is identified at the 11:30 position of the left breast 4 cm from the nipple. Targeted ultrasound is performed, showing a 2.8 x 1.4 x 3.6 cm simple cyst at the 11:30 position of the left breast,  corresponding to the screening study finding.      02/07/2016 Imaging    Targeted ultrasound of the left breast was performed demonstrating an irregular shadowing hypoechoic mass at 1 o'clock 5 cm from nipple measuring approximately 2.3 x 1 x 1.6 cm. This corresponds well with mammography findings. No lymphadenopathy seen in the left axilla.      02/07/2016 Mammogram    Note that the patient has a stable oval circumscribed mass in the retroareolar left breast previously characterized as a cyst. Although this cyst appears stable when compared to 2016 it is overall increasing in size and therefore given the patient's highly suspicious mass in the upper-outer left breast aspiration of the retroareolar left breast cyst is warranted.      02/07/2016 Procedure    She had US guided biopsy of breast mass      02/07/2016 Pathology Results    Accession: [id number]: Biopsy confirmed invasive breast cancer, ER 10% positive, PR 10% positive and Her2/neu negative, Ki 67 15%  Additional molecular study with MammaPrint result came back low risk (10% risk of recurrence in 10 years)      02/27/2016 Imaging    MR breast showed area of known malignancy in the upper-outer quadrant of the left breast, measuring maximum of 4.0 cm (anterior-posterior) an associated clip artifact. No findings to indicate multicentric, multifocal, or contralateral malignancy. No MRI evidence for adenopathy      03/03/2016 Surgery    She underwent left partial mastectomy and sentinel lymph node biopsy  03/03/2016 Pathology Results    CASE: [phone] Invasive lobular carcinoma 2.3 cm, Histologic Grade (Nottingham Histologic Score) Glandular (Acinar)/Tubular Differentiation: Score 3 Nuclear Pleomorphism: Score 2 Mitotic Rate: Score 1 Total score: 6 Overall Grade: Grade 2 Tumor Size: 23 mm. Size of lymph node invasion is 2.5 mm        Interval history-patient reports intolerable hot flashes which have been going on  for a while now but recently getting worse.  She is already on citalopram which is not been helping she has problems in her bilateral knees and also reports pain and swelling and inability to hold her left little finger for which she is going to be seeing orthopedics soon  ECOG PS- 1 Pain scale- 5 Opioid associated constipation- no  Review of systems- Review of Systems  Constitutional: Negative for chills, fever, malaise/fatigue and weight loss.  HENT: Negative for congestion, ear discharge and nosebleeds.   Eyes: Negative for blurred vision.  Respiratory: Negative for cough, hemoptysis, sputum production, shortness of breath and wheezing.   Cardiovascular: Negative for chest pain, palpitations, orthopnea and claudication.  Gastrointestinal: Negative for abdominal pain, blood in stool, constipation, diarrhea, heartburn, melena, nausea and vomiting.  Genitourinary: Negative for dysuria, flank pain, frequency, hematuria and urgency.  Musculoskeletal: Negative for back pain, joint pain and myalgias.  Skin: Negative for rash.  Neurological: Negative for dizziness, tingling, focal weakness, seizures, weakness and headaches.  Endo/Heme/Allergies: Does not bruise/bleed easily.  Psychiatric/Behavioral: Negative for depression and suicidal ideas. The patient does not have insomnia.        Allergies  Allergen Reactions  . Penicillins Anaphylaxis    REACTION: severe anaphylaxis Has patient had a PCN reaction causing immediate rash, facial/tongue/throat swelling, SOB or lightheadedness with hypotension: Yes Has patient had a PCN reaction causing severe rash involving mucus membranes or skin necrosis: No Has patient had a PCN reaction that required hospitalization No Has patient had a PCN reaction occurring within the last 10 years: No If all of the above answers are "NO", then may proceed with Cephalosporin use.   Marland Kitchen Zithromax [Azithromycin] Rash    z-pack = rash  . Anastrozole Other (See  Comments)    Extreme sweating  . Cefuroxime Axetil Other (See Comments)    unknown  . Cefuroxime Axetil Other (See Comments)  . Codeine Other (See Comments)    unknown  . Erythromycin Other (See Comments)    unknown  . Nitrofurantoin Other (See Comments)    unknown  . Ofloxacin Other (See Comments)    unknown  . Propoxyphene Other (See Comments)  . Propoxyphene N-Acetaminophen Other (See Comments)    unknown  . Sulfonamide Derivatives Other (See Comments)    unknown  . Tetracycline Other (See Comments)    unknown  . Sulfa Antibiotics Other (See Comments) and Rash     Past Medical History:  Diagnosis Date  . Anxiety   . Asthma   . Breast cancer ([place]) 02/07/2016   left breast  . CAD (coronary artery disease)    mild CAD by cath in 2001  . Depression   . Diverticulosis   . Heart murmur   . HTN (hypertension)   . Hyperlipidemia   . Palpitations   . Syncope    Pre-syncope     Past Surgical History:  Procedure Laterality Date  . ABDOMINAL HYSTERECTOMY    . BREAST BIOPSY Left 02/07/2016   positive  . BREAST LUMPECTOMY Left 03/03/2016   positive  . CHOLECYSTECTOMY    . KNEE  ARTHROSCOPY Right   . PARTIAL MASTECTOMY WITH NEEDLE LOCALIZATION Left 03/03/2016   Procedure: PARTIAL MASTECTOMY WITH NEEDLE LOCALIZATION;  Surgeon: Leonie Green, MD;  Location: [facility] ORS;  Service: General;  Laterality: Left;  . SENTINEL NODE BIOPSY Left 03/03/2016   Procedure: SENTINEL NODE BIOPSY;  Surgeon: Leonie Green, MD;  Location: [facility] ORS;  Service: General;  Laterality: Left;  . TUBAL LIGATION      Social History   Socioeconomic History  . Marital status: Married    Spouse name: Not on file  . Number of children: Not on file  . Years of education: Not on file  . Highest education level: Not on file  Occupational History  . Not on file  Social Needs  . Financial resource strain: Not on file  . Food insecurity:    Worry: Not on file    Inability: Not on file    . Transportation needs:    Medical: Not on file    Non-medical: Not on file  Tobacco Use  . Smoking status: Never Smoker  . Smokeless tobacco: Never Used  Substance and Sexual Activity  . Alcohol use: No    Alcohol/week: 0.0 oz  . Drug use: No  . Sexual activity: Not on file  Lifestyle  . Physical activity:    Days per week: Not on file    Minutes per session: Not on file  . Stress: Not on file  Relationships  . Social connections:    Talks on phone: Not on file    Gets together: Not on file    Attends religious service: Not on file    Active member of club or organization: Not on file    Attends meetings of clubs or organizations: Not on file    Relationship status: Not on file  . Intimate partner violence:    Fear of current or ex partner: Not on file    Emotionally abused: Not on file    Physically abused: Not on file    Forced sexual activity: Not on file  Other Topics Concern  . Not on file  Social History Narrative  . Not on file    Family History  Problem Relation Age of Onset  . Cancer Father        oral cancer  . Cancer Sister        colon cancer and lymphoma     Current Outpatient Medications:  .  ALPRAZolam (XANAX) 0.5 MG tablet, Take 0.5 mg by mouth at bedtime as needed for anxiety., Disp: , Rfl:  .  amLODipine (NORVASC) 5 MG tablet, Take 5 mg by mouth daily.  , Disp: , Rfl:  .  Calcium Carb-Cholecalciferol (CALCIUM 500/D) 500-400 MG-UNIT CHEW, Chew 1,200 Units by mouth 2 (two) times daily., Disp: , Rfl:  .  citalopram (CELEXA) 20 MG tablet, Take 20 mg by mouth daily., Disp: , Rfl:  .  exemestane (AROMASIN) 25 MG tablet, TAKE 1 TABLET BY MOUTH ONCE DAILY AFTER BREAKFAST, Disp: 90 tablet, Rfl: 1 .  fluticasone (FLONASE) 50 MCG/ACT nasal spray, Place 2 sprays into both nostrils daily., Disp: , Rfl:  .  hydrochlorothiazide (HYDRODIURIL) 25 MG tablet, Take 25 mg by mouth daily., Disp: , Rfl:  .  montelukast (SINGULAIR) 10 MG tablet, Take 10 mg by mouth at  bedtime., Disp: , Rfl:  .  acetaminophen (TYLENOL) 500 MG tablet, Take 1,000 mg by mouth every 6 (six) hours as needed for mild pain., Disp: , Rfl:  .  atorvastatin (LIPITOR) 20 MG tablet, Take 20 mg by mouth daily., Disp: , Rfl:  .  diclofenac (CATAFLAM) 50 MG tablet, Take 1 tablet (50 mg total) by mouth 2 (two) times daily. (Patient not taking: Reported on 10/28/2017), Disp: 60 tablet, Rfl: 2 .  diclofenac (VOLTAREN) 50 MG EC tablet, , Disp: , Rfl:  .  rosuvastatin (CRESTOR) 10 MG tablet, , Disp: , Rfl:  .  silver sulfADIAZINE (SILVADENE) 1 % cream, Apply 1 application topically 2 (two) times daily. (Patient not taking: Reported on 04/22/2017), Disp: 50 g, Rfl: 2  Physical exam:  Vitals:   10/28/17 1104  BP: 131/74  Pulse: 67  Resp: 18  Temp: (!) 96.3 F (35.7 C)  TempSrc: Tympanic  Weight: 183 lb 4.8 oz (83.1 kg)   Physical Exam  Constitutional: She is oriented to person, place, and time. She appears well-developed and well-nourished.  HENT:  Head: Normocephalic and atraumatic.  Eyes: Pupils are equal, round, and reactive to light. EOM are normal.  Neck: Normal range of motion.  Cardiovascular: Normal rate, regular rhythm and normal heart sounds.  Pulmonary/Chest: Effort normal and breath sounds normal.  Abdominal: Soft. Bowel sounds are normal.  Neurological: She is alert and oriented to person, place, and time.  Skin: Skin is warm and dry.   Breast exam was performed in seated and lying down position. Patient is status post left lumpectomy with a well-healed surgical scar. No evidence of any palpable masses. No evidence of axillary adenopathy. No evidence of any palpable masses or lumps in the right breast. No evidence of right axillary adenopathy   CMP Latest Ref Rng & Units 04/22/2017  Glucose 65 - 99 mg/dL 122(H)  BUN 6 - 20 mg/dL 18  Creatinine 0.44 - 1.00 mg/dL 0.77  Sodium 135 - 145 mmol/L 136  Potassium 3.5 - 5.1 mmol/L 3.6  Chloride 101 - 111 mmol/L 98(L)  CO2 22 -  32 mmol/L 29  Calcium 8.9 - 10.3 mg/dL 9.6  Total Protein 6.5 - 8.1 g/dL 7.9  Total Bilirubin 0.3 - 1.2 mg/dL 0.9  Alkaline Phos 38 - 126 U/L 110  AST 15 - 41 U/L 33  ALT 14 - 54 U/L 35   CBC Latest Ref Rng & Units 04/22/2017  WBC 3.6 - 11.0 K/uL 4.6  Hemoglobin 12.0 - 16.0 g/dL 13.4  Hematocrit 35.0 - 47.0 % 39.8  Platelets 150 - 440 K/uL 212     Assessment and plan- Patient is a 72 y.o. female  a h/o Stage IIb pT2N1a(sn)cM0 invasive lobular carcinoma of the left breast ER 10% positive, PR 10% positive, HER-2/neu negative status post lumpectomy/SLND currently on aromasin.  She is here for routine follow-up  1. Patient will continue to take Aromasin along with calcium and vitamin D until 2022. She will be getting mammograms through Dr. Deniece Ree office.  Clinically she is doing well and there is no evidence of recurrence on today's exam.  I will see her back in 6 months no labs.  I will check a bone density scan right before her next visit  2.  Hot flashes: Patient is already on citalopram which has not been helping her with her hot flashes.  Gabapentin is not covered under her insurance and I will prescribe Lyrica 75 mg twice daily and see if it makes her feel better.  If she does not feel better in about 2 to 3 weeks she can slowly stop taking her Lyrica at that point     Visit  Diagnosis 1. Encounter for follow-up surveillance of breast cancer   2. Malignant neoplasm of upper-outer quadrant of left breast in female, estrogen receptor positive ([place])   3. Osteopenia, unspecified location   4. Hot flashes      Dr. Randa Evens, MD, MPH [facility] [phone] 10/28/2017 11:55 AM

## 2017-12-09 DIAGNOSIS — L821 Other seborrheic keratosis: Secondary | ICD-10-CM | POA: Diagnosis not present

## 2017-12-09 DIAGNOSIS — Z1283 Encounter for screening for malignant neoplasm of skin: Secondary | ICD-10-CM | POA: Diagnosis not present

## 2017-12-09 DIAGNOSIS — C4492 Squamous cell carcinoma of skin, unspecified: Secondary | ICD-10-CM

## 2017-12-09 DIAGNOSIS — D1800 Hemangioma unspecified site: Secondary | ICD-10-CM | POA: Diagnosis not present

## 2017-12-09 DIAGNOSIS — L578 Other skin changes due to chronic exposure to nonionizing radiation: Secondary | ICD-10-CM | POA: Diagnosis not present

## 2017-12-09 DIAGNOSIS — I8393 Asymptomatic varicose veins of bilateral lower extremities: Secondary | ICD-10-CM | POA: Diagnosis not present

## 2017-12-09 DIAGNOSIS — L812 Freckles: Secondary | ICD-10-CM | POA: Diagnosis not present

## 2017-12-09 DIAGNOSIS — D485 Neoplasm of uncertain behavior of skin: Secondary | ICD-10-CM | POA: Diagnosis not present

## 2017-12-09 DIAGNOSIS — L918 Other hypertrophic disorders of the skin: Secondary | ICD-10-CM | POA: Diagnosis not present

## 2017-12-09 DIAGNOSIS — D225 Melanocytic nevi of trunk: Secondary | ICD-10-CM | POA: Diagnosis not present

## 2017-12-09 DIAGNOSIS — D044 Carcinoma in situ of skin of scalp and neck: Secondary | ICD-10-CM | POA: Diagnosis not present

## 2017-12-09 HISTORY — DX: Squamous cell carcinoma of skin, unspecified: C44.92

## 2017-12-22 ENCOUNTER — Other Ambulatory Visit: Payer: Self-pay | Admitting: General Surgery

## 2017-12-22 DIAGNOSIS — Z1231 Encounter for screening mammogram for malignant neoplasm of breast: Secondary | ICD-10-CM

## 2017-12-22 DIAGNOSIS — Z853 Personal history of malignant neoplasm of breast: Secondary | ICD-10-CM

## 2018-01-11 ENCOUNTER — Other Ambulatory Visit: Payer: Self-pay | Admitting: Oncology

## 2018-01-25 IMAGING — MG 2D DIGITAL SCREENING BILATERAL MAMMOGRAM WITH CAD AND ADJUNCT TO
8 of 12 series · 8 of 28 positions shown · non-contrast
Comparison: Previous exam(s).

CLINICAL DATA: Screening.

EXAM:
2D DIGITAL SCREENING BILATERAL MAMMOGRAM WITH CAD AND ADJUNCT TOMO

[R MLO]
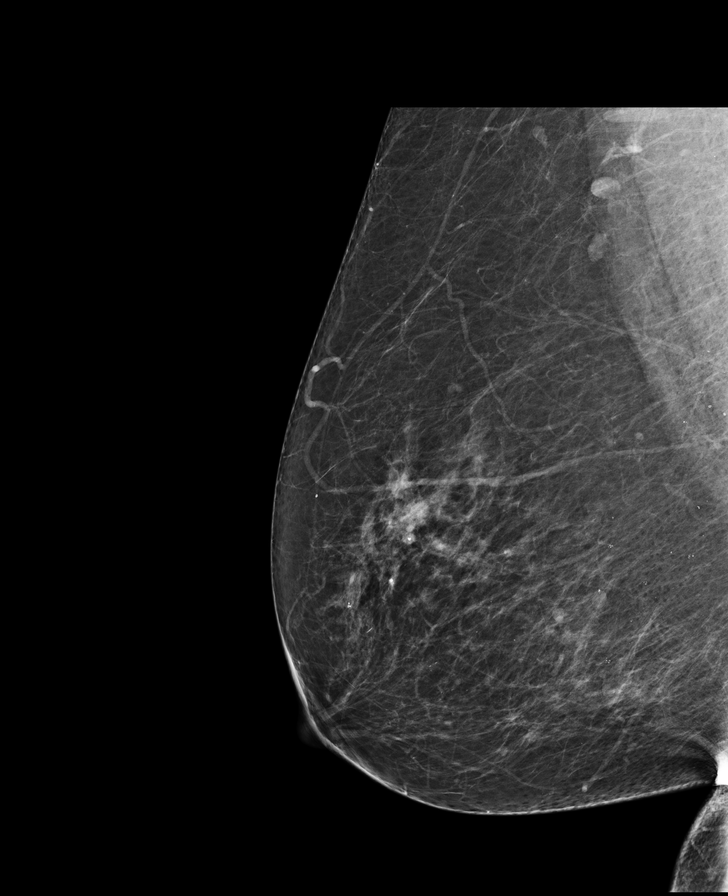

[R CC synth-2D]
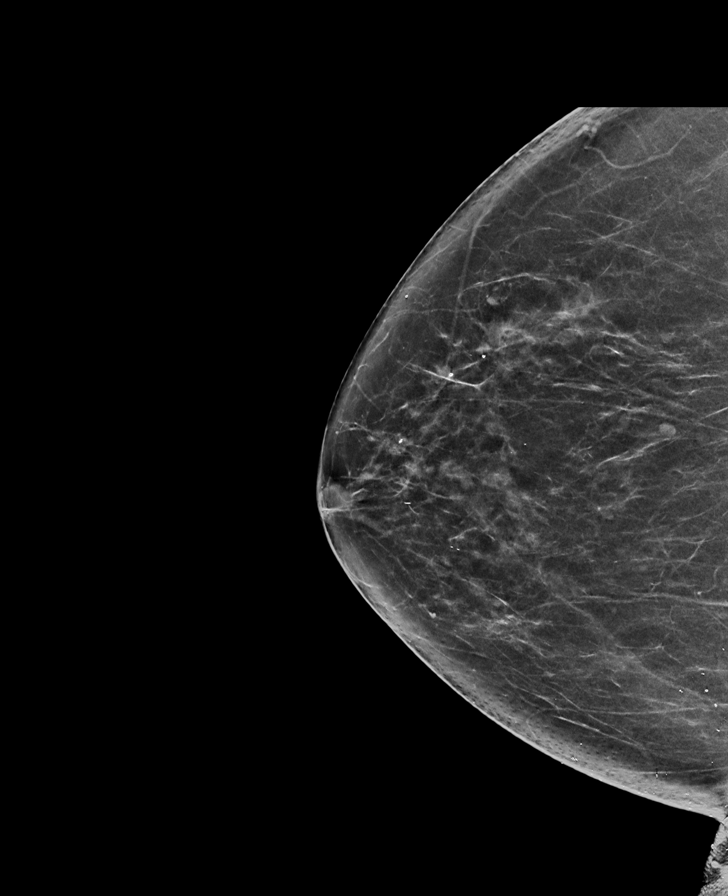

[L MLO]
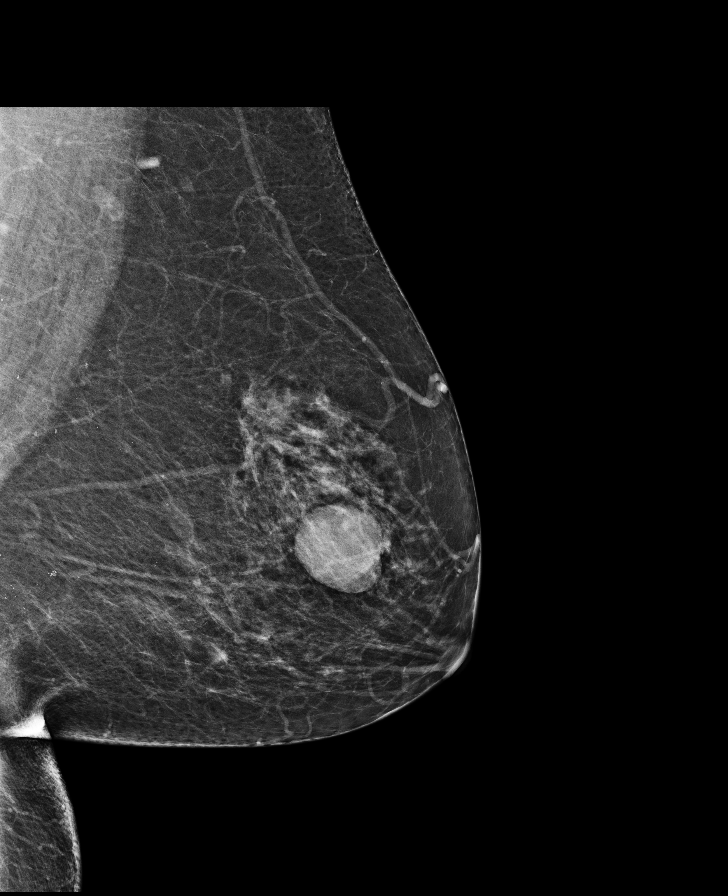

[L CC]
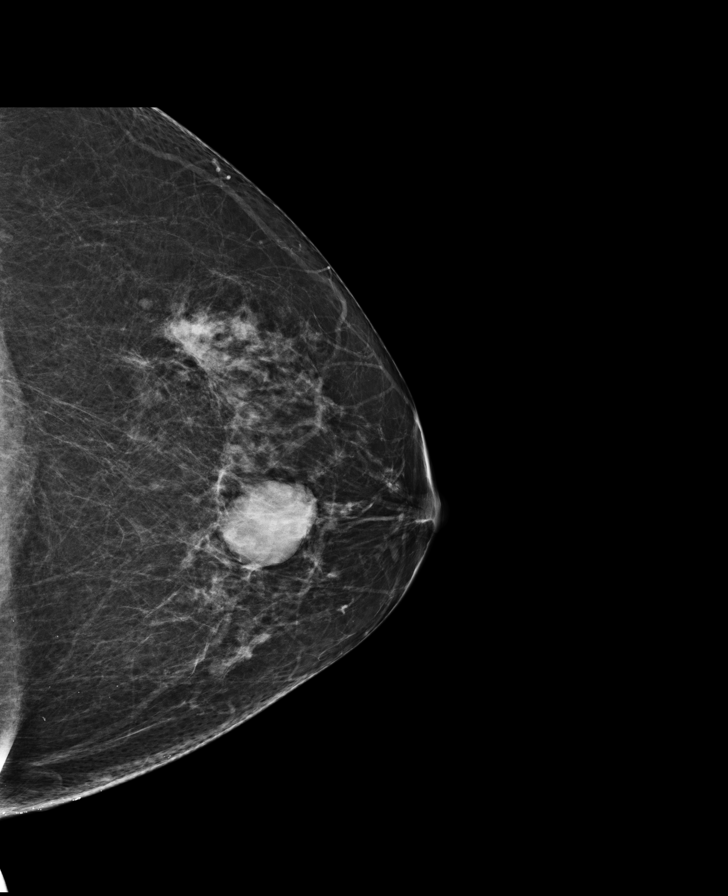

[L CC synth-2D]
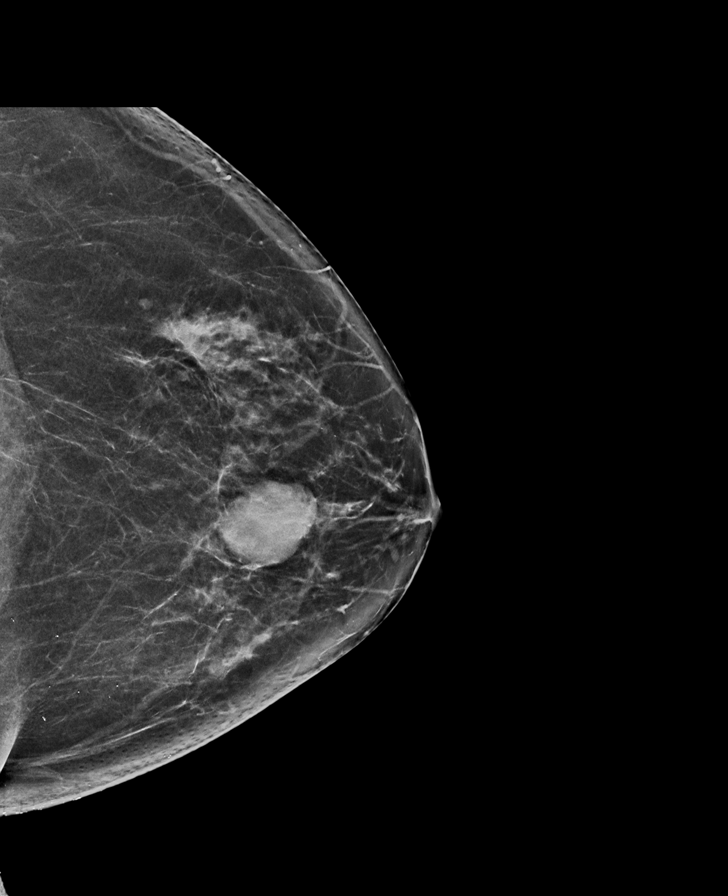

[R CC]
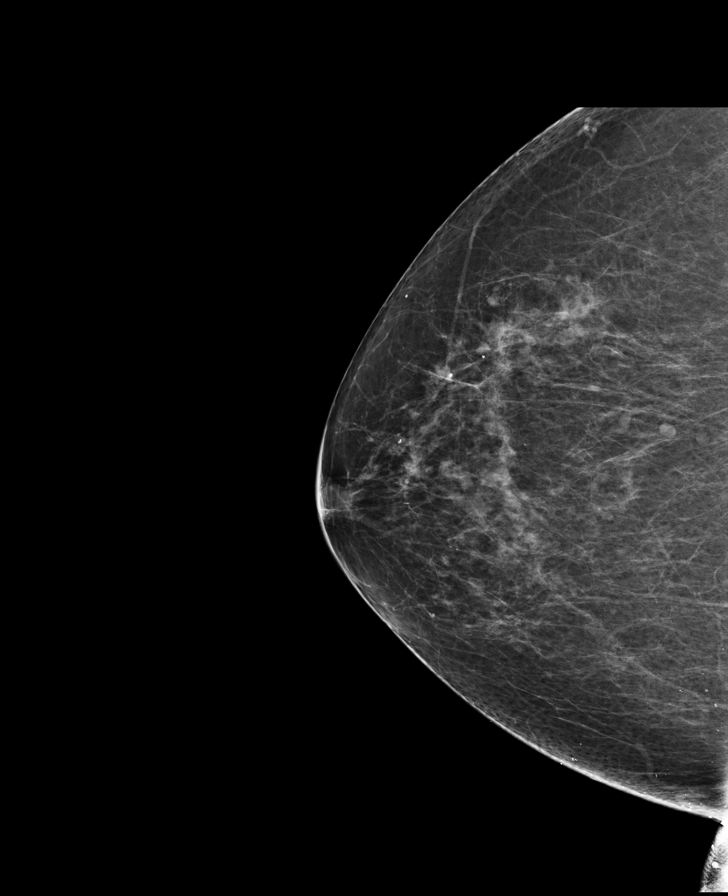

[R MLO synth-2D]
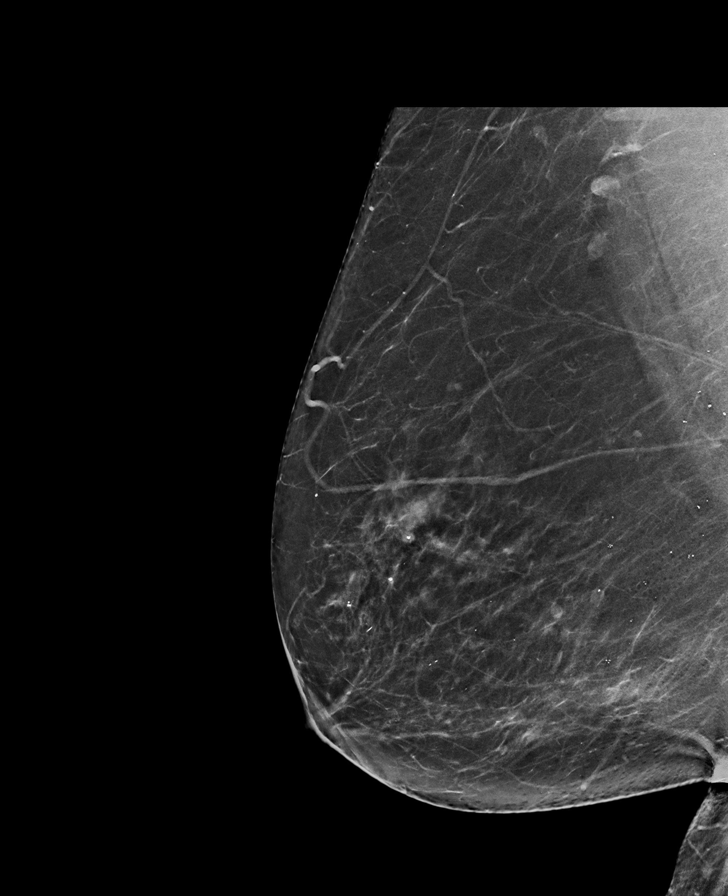

[L MLO synth-2D]
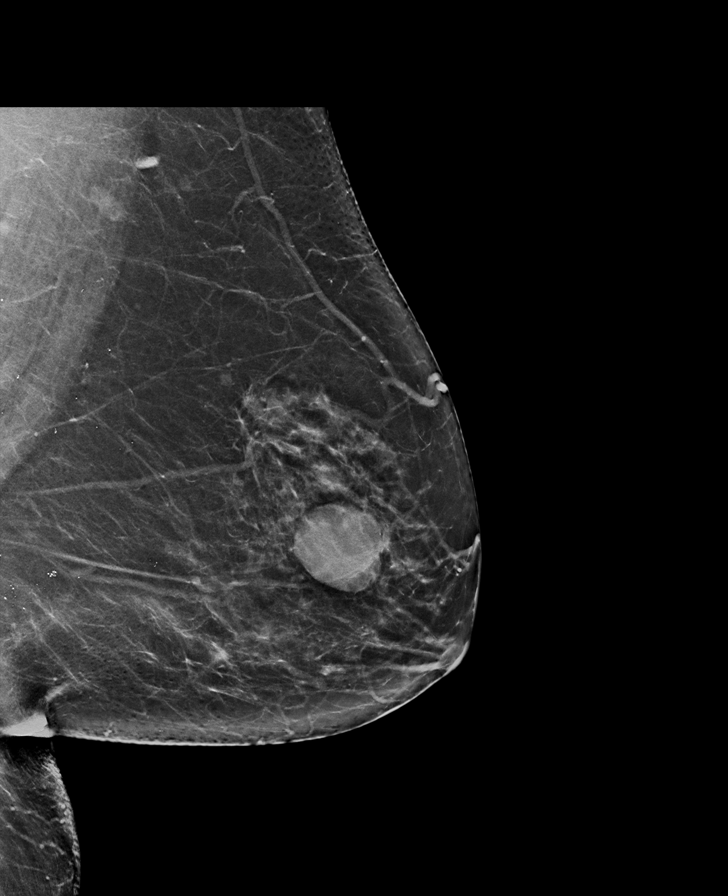

[8 of 28 positions shown; findings below may reference images not displayed]

ACR Breast Density Category b: There are scattered areas of
fibroglandular density.
FINDINGS: In the left breast, possible distortion warrants further evaluation.
In the right breast, no findings suspicious for malignancy. Images
were processed with CAD.
IMPRESSION: Further evaluation is suggested for possible distortion in the left
breast.

RECOMMENDATION:
Diagnostic mammogram and possibly ultrasound of the left breast.
(Code:FU-H-RR3)

The patient will be contacted regarding the findings, and additional
imaging will be scheduled.

BI-RADS CATEGORY  0: Incomplete. Need additional imaging evaluation
and/or prior mammograms for comparison.

## 2018-01-29 IMAGING — MG MM CLIP PLACEMENT
2 series · 2 of 2 positions shown · non-contrast
Comparison: Previous exam(s).

CLINICAL DATA: Post ultrasound-guided biopsy of a highly suspicious
mass/ area of distortion in the left breast at the 1 o'clock
position.

EXAM:
DIAGNOSTIC LEFT MAMMOGRAM POST ULTRASOUND BIOPSY

[L CC]
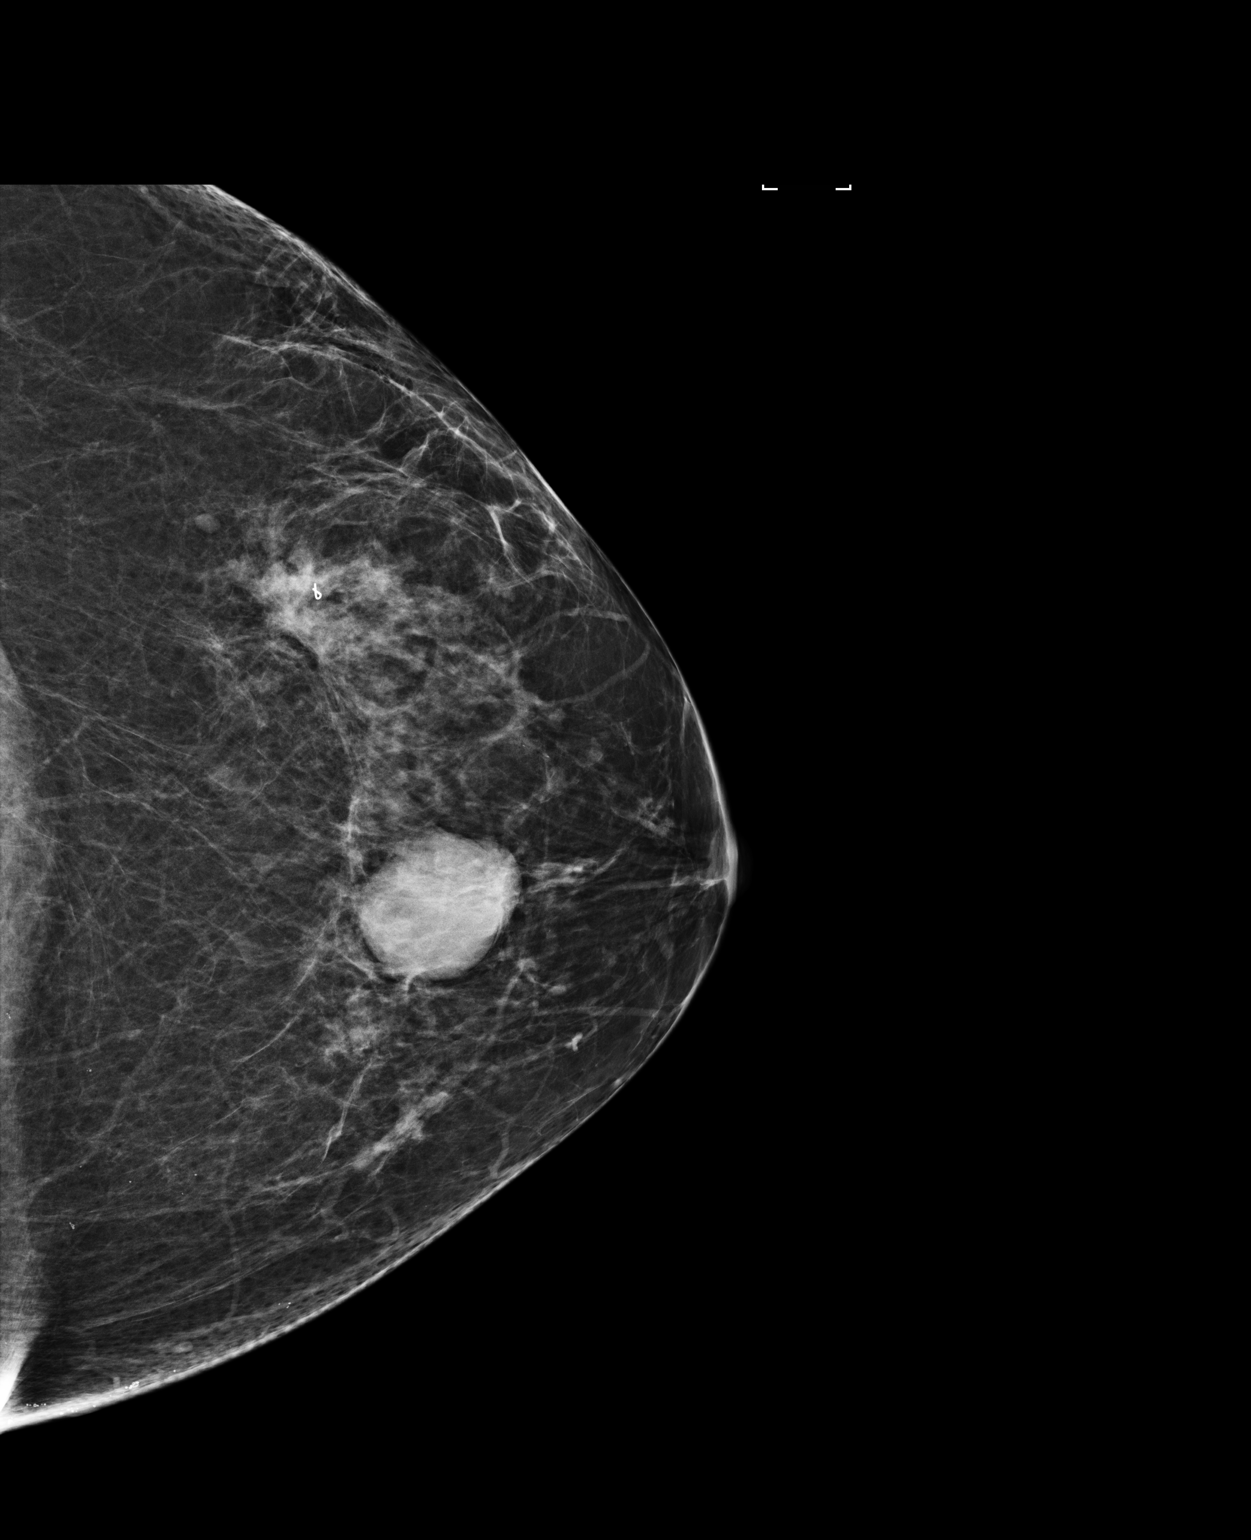

[L ML]
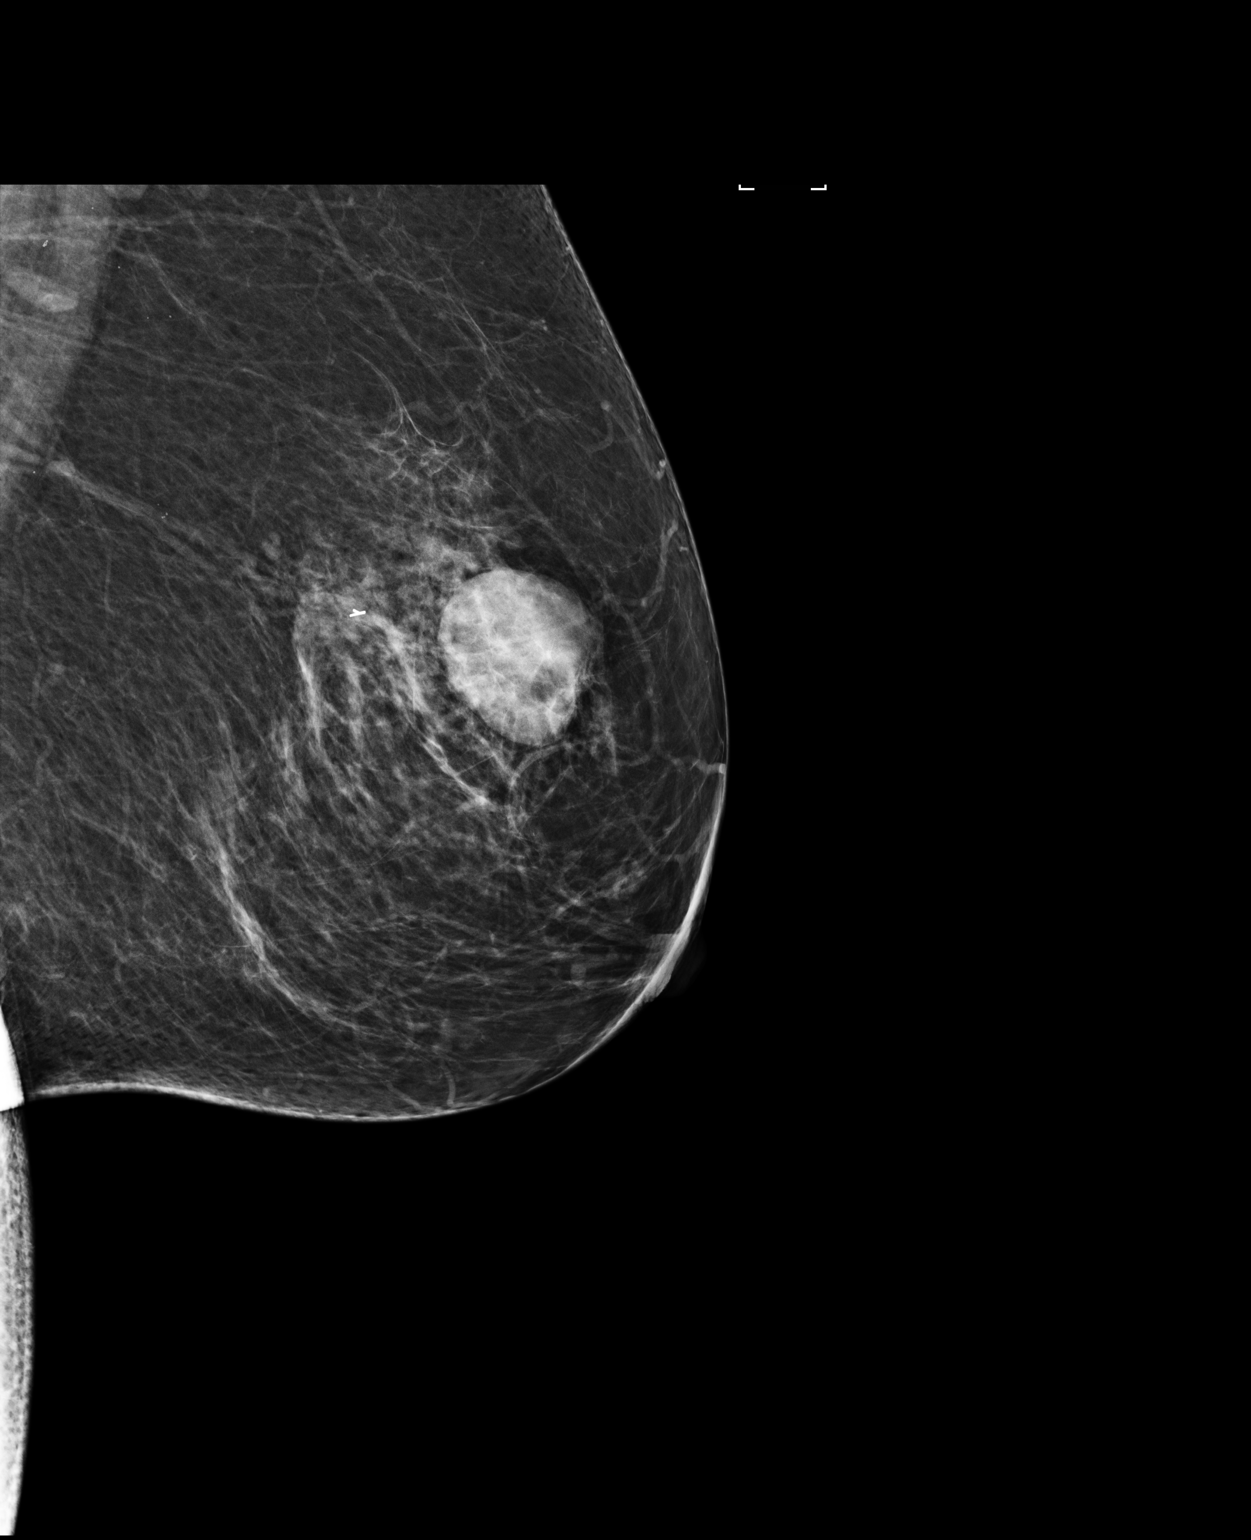

[2 of 2 positions shown; findings below may reference images not displayed]

FINDINGS: Mammographic images were obtained following ultrasound guided biopsy
of an irregular mass/area of distortion in the upper-outer left
breast at the 1 o'clock position. A ribbon shaped biopsy marking
clip is present at the site of biopsied distortion/ mass in the left
breast.
IMPRESSION: Appropriate ribbon shaped biopsy marking clip position post
ultrasound-guided biopsy of a mass/ distortion in the left breast at
1 o'clock.

Final Assessment: Post Procedure Mammograms for Marker Placement

## 2018-02-01 DIAGNOSIS — D044 Carcinoma in situ of skin of scalp and neck: Secondary | ICD-10-CM | POA: Diagnosis not present

## 2018-02-07 ENCOUNTER — Ambulatory Visit
Admission: RE | Admit: 2018-02-07 | Discharge: 2018-02-07 | Disposition: A | Discharge status: Home / Self Care | Payer: PPO | Source: Ambulatory Visit | Attending: General Surgery | Admitting: General Surgery

## 2018-02-07 DIAGNOSIS — Z853 Personal history of malignant neoplasm of breast: Secondary | ICD-10-CM

## 2018-02-07 DIAGNOSIS — R928 Other abnormal and inconclusive findings on diagnostic imaging of breast: Secondary | ICD-10-CM | POA: Diagnosis not present

## 2018-02-07 HISTORY — DX: Personal history of irradiation: Z92.3

## 2018-02-21 DIAGNOSIS — C50312 Malignant neoplasm of lower-inner quadrant of left female breast: Secondary | ICD-10-CM | POA: Diagnosis not present

## 2018-02-21 DIAGNOSIS — Z170 Estrogen receptor positive status [ER+]: Secondary | ICD-10-CM | POA: Diagnosis not present

## 2018-05-03 ENCOUNTER — Ambulatory Visit: Payer: PPO | Admitting: Oncology

## 2018-05-09 ENCOUNTER — Ambulatory Visit
Admission: RE | Admit: 2018-05-09 | Discharge: 2018-05-09 | Disposition: A | Discharge status: Home / Self Care | Payer: PPO | Source: Ambulatory Visit | Attending: Oncology | Admitting: Oncology

## 2018-05-09 DIAGNOSIS — M8580 Other specified disorders of bone density and structure, unspecified site: Secondary | ICD-10-CM

## 2018-05-09 DIAGNOSIS — Z170 Estrogen receptor positive status [ER+]: Secondary | ICD-10-CM | POA: Insufficient documentation

## 2018-05-09 DIAGNOSIS — C50412 Malignant neoplasm of upper-outer quadrant of left female breast: Secondary | ICD-10-CM | POA: Insufficient documentation

## 2018-05-09 DIAGNOSIS — Z780 Asymptomatic menopausal state: Secondary | ICD-10-CM | POA: Diagnosis not present

## 2018-05-09 DIAGNOSIS — M85851 Other specified disorders of bone density and structure, right thigh: Secondary | ICD-10-CM | POA: Diagnosis not present

## 2018-05-12 ENCOUNTER — Inpatient Hospital Stay: Payer: PPO | Attending: Oncology | Admitting: Oncology

## 2018-05-12 ENCOUNTER — Other Ambulatory Visit: Payer: Self-pay

## 2018-05-12 ENCOUNTER — Encounter: Payer: Self-pay | Admitting: Oncology

## 2018-05-12 VITALS — BP 122/71 | HR 64 | Temp 97.0°F | Resp 20 | Ht 64.0 in | Wt 184.0 lb

## 2018-05-12 DIAGNOSIS — M8580 Other specified disorders of bone density and structure, unspecified site: Secondary | ICD-10-CM

## 2018-05-12 DIAGNOSIS — M85851 Other specified disorders of bone density and structure, right thigh: Secondary | ICD-10-CM

## 2018-05-12 DIAGNOSIS — Z79899 Other long term (current) drug therapy: Secondary | ICD-10-CM | POA: Insufficient documentation

## 2018-05-12 DIAGNOSIS — Z08 Encounter for follow-up examination after completed treatment for malignant neoplasm: Secondary | ICD-10-CM

## 2018-05-12 DIAGNOSIS — I10 Essential (primary) hypertension: Secondary | ICD-10-CM

## 2018-05-12 DIAGNOSIS — Z90710 Acquired absence of both cervix and uterus: Secondary | ICD-10-CM | POA: Diagnosis not present

## 2018-05-12 DIAGNOSIS — Z853 Personal history of malignant neoplasm of breast: Secondary | ICD-10-CM

## 2018-05-12 DIAGNOSIS — C50412 Malignant neoplasm of upper-outer quadrant of left female breast: Secondary | ICD-10-CM | POA: Insufficient documentation

## 2018-05-12 DIAGNOSIS — Z170 Estrogen receptor positive status [ER+]: Secondary | ICD-10-CM | POA: Diagnosis not present

## 2018-05-12 NOTE — Progress Notes (Signed)
Patient here for follow up. She has recently been treated for a sinus infection.

## 2018-05-13 NOTE — Progress Notes (Signed)
Hematology/Oncology Consult note [facility]  [phone] [phone]  Patient Care Team: Marton Redwood, MD as PCP - General (Internal Medicine)   Name of the patient: Jamie Curry  [id number]  02/05/1946   Date of visit: 05/13/18  Diagnosis- Stage IIb pT2N1a(sn)cM0 invasive lobular carcinoma of the left breast ER 10% positive, PR 10% positive, HER-2/neu negative status post lumpectomy/SLND  Chief complaint/ Reason for visit-routine follow-up of breast cancer  Heme/Onc history:    Breast cancer of upper-outer quadrant of left female breast ([place])   02/15/2008 Imaging    Small  probable mildly complicated cysts within the upper outer quadrant right breast at the 11 o'clock position in the region of questionable nodularity noted on mammography.  Recommend follow-up right breast diagnostic mammogram and ultrasound in 6 months.       08/13/2008 Imaging    Ultrasound is performed, showing stable small minimal complicated cysts at the right breast 11 o'clock position 4 cm from the nipple, not changed compared prior exam    09/23/2010 Imaging    1.3 cm simple cyst located within the left breast at the 12 o'clock position corresponding to the mammographic finding.  No findings worrisome for malignancy.  Recommend annual screening mammography    09/23/2010 Mammogram    1.3 cm simple cyst located within the left breast at the 12 o'clock position corresponding to the mammographic finding.  No findings worrisome for malignancy.     02/05/2015 Imaging    A 2.5 x 3.5 cm oval circumscribed mass within the upper retroareolar left breast is identified. No suspicious calcifications or distortion identified.  On physical exam, a firm palpable mobile mass is identified at the 11:30 position of the left breast 4 cm from the nipple. Targeted ultrasound is performed, showing a 2.8 x 1.4 x 3.6 cm simple cyst at the 11:30 position of the left breast, corresponding  to the screening study finding.    02/07/2016 Imaging    Targeted ultrasound of the left breast was performed demonstrating an irregular shadowing hypoechoic mass at 1 o'clock 5 cm from nipple measuring approximately 2.3 x 1 x 1.6 cm. This corresponds well with mammography findings. No lymphadenopathy seen in the left axilla.    02/07/2016 Mammogram    Note that the patient has a stable oval circumscribed mass in the retroareolar left breast previously characterized as a cyst. Although this cyst appears stable when compared to 2016 it is overall increasing in size and therefore given the patient's highly suspicious mass in the upper-outer left breast aspiration of the retroareolar left breast cyst is warranted.    02/07/2016 Procedure    She had US guided biopsy of breast mass    02/07/2016 Pathology Results    Accession: [id number]: Biopsy confirmed invasive breast cancer, ER 10% positive, PR 10% positive and Her2/neu negative, Ki 67 15%  Additional molecular study with MammaPrint result came back low risk (10% risk of recurrence in 10 years)    02/27/2016 Imaging    MR breast showed area of known malignancy in the upper-outer quadrant of the left breast, measuring maximum of 4.0 cm (anterior-posterior) an associated clip artifact. No findings to indicate multicentric, multifocal, or contralateral malignancy. No MRI evidence for adenopathy    03/03/2016 Surgery    She underwent left partial mastectomy and sentinel lymph node biopsy    03/03/2016 Pathology Results    CASE: [id number] Invasive lobular carcinoma 2.3 cm, Histologic Grade (Nottingham Histologic Score) Glandular (Acinar)/Tubular Differentiation: Score  3 Nuclear Pleomorphism: Score 2 Mitotic Rate: Score 1 Total score: 6 Overall Grade: Grade 2 Tumor Size: 23 mm. Size of lymph node invasion is 2.5 mm      Interval history-she tried to take gabapentin for hot flashes but after a few days she had some episode of confusion and  was not sure if it was secondary to gabapentin.  She therefore stopped taking it.  Reports that her hot flashes are now that severe at this time.  ECOG PS- 1 Pain scale- 0   Review of systems- Review of Systems  Constitutional: Positive for malaise/fatigue. Negative for chills, fever and weight loss.  HENT: Negative for congestion, ear discharge and nosebleeds.   Eyes: Negative for blurred vision.  Respiratory: Negative for cough, hemoptysis, sputum production, shortness of breath and wheezing.   Cardiovascular: Negative for chest pain, palpitations, orthopnea and claudication.  Gastrointestinal: Negative for abdominal pain, blood in stool, constipation, diarrhea, heartburn, melena, nausea and vomiting.  Genitourinary: Negative for dysuria, flank pain, frequency, hematuria and urgency.  Musculoskeletal: Negative for back pain, joint pain and myalgias.  Skin: Negative for rash.  Neurological: Negative for dizziness, tingling, focal weakness, seizures, weakness and headaches.  Endo/Heme/Allergies: Does not bruise/bleed easily.  Psychiatric/Behavioral: Negative for depression and suicidal ideas. The patient does not have insomnia.       Allergies  Allergen Reactions  . Penicillins Anaphylaxis    REACTION: severe anaphylaxis Has patient had a PCN reaction causing immediate rash, facial/tongue/throat swelling, SOB or lightheadedness with hypotension: Yes Has patient had a PCN reaction causing severe rash involving mucus membranes or skin necrosis: No Has patient had a PCN reaction that required hospitalization No Has patient had a PCN reaction occurring within the last 10 years: No If all of the above answers are "NO", then may proceed with Cephalosporin use.   Marland Kitchen Zithromax [Azithromycin] Rash    z-pack = rash  . Anastrozole Other (See Comments)    Extreme sweating  . Cefuroxime Axetil Other (See Comments)    unknown  . Cefuroxime Axetil Other (See Comments)  . Codeine Other (See  Comments)    unknown  . Erythromycin Other (See Comments)    unknown  . Nitrofurantoin Other (See Comments)    unknown  . Ofloxacin Other (See Comments)    unknown  . Propoxyphene Other (See Comments)  . Propoxyphene N-Acetaminophen Other (See Comments)    unknown  . Sulfonamide Derivatives Other (See Comments)    unknown  . Tetracycline Other (See Comments)    unknown  . Sulfa Antibiotics Other (See Comments) and Rash     Past Medical History:  Diagnosis Date  . Anxiety   . Asthma   . Breast cancer ([place]) 02/07/2016   left breast invasive lobular carcinoma  . CAD (coronary artery disease)    mild CAD by cath in 2001  . Depression   . Diverticulosis   . Heart murmur   . HTN (hypertension)   . Hyperlipidemia   . Palpitations   . Personal history of radiation therapy 2018   left breast ca  . Syncope    Pre-syncope     Past Surgical History:  Procedure Laterality Date  . ABDOMINAL HYSTERECTOMY    . BREAST BIOPSY Left 02/07/2016   grade II invasive and in situ mammary carcinoma  . BREAST CYST ASPIRATION Left 02/13/2016   cyst  . BREAST LUMPECTOMY Left 03/03/2016   invasive lobular carcinoma, clear margins, METASTATIC LOBULAR CARCINOMA, 2.5 MM, IN ONE LYMPH NODE (  1/1).   . CHOLECYSTECTOMY    . KNEE ARTHROSCOPY Right   . PARTIAL MASTECTOMY WITH NEEDLE LOCALIZATION Left 03/03/2016   Procedure: PARTIAL MASTECTOMY WITH NEEDLE LOCALIZATION;  Surgeon: Leonie Green, MD;  Location: [facility] ORS;  Service: General;  Laterality: Left;  . SENTINEL NODE BIOPSY Left 03/03/2016   Procedure: SENTINEL NODE BIOPSY;  Surgeon: Leonie Green, MD;  Location: [facility] ORS;  Service: General;  Laterality: Left;  . TUBAL LIGATION      Social History   Socioeconomic History  . Marital status: Married    Spouse name: Not on file  . Number of children: Not on file  . Years of education: Not on file  . Highest education level: Not on file  Occupational History  . Not on file    Social Needs  . Financial resource strain: Not on file  . Food insecurity:    Worry: Not on file    Inability: Not on file  . Transportation needs:    Medical: Not on file    Non-medical: Not on file  Tobacco Use  . Smoking status: Never Smoker  . Smokeless tobacco: Never Used  Substance and Sexual Activity  . Alcohol use: No    Alcohol/week: 0.0 standard drinks  . Drug use: No  . Sexual activity: Not on file  Lifestyle  . Physical activity:    Days per week: Not on file    Minutes per session: Not on file  . Stress: Not on file  Relationships  . Social connections:    Talks on phone: Not on file    Gets together: Not on file    Attends religious service: Not on file    Active member of club or organization: Not on file    Attends meetings of clubs or organizations: Not on file    Relationship status: Not on file  . Intimate partner violence:    Fear of current or ex partner: Not on file    Emotionally abused: Not on file    Physically abused: Not on file    Forced sexual activity: Not on file  Other Topics Concern  . Not on file  Social History Narrative  . Not on file    Family History  Problem Relation Age of Onset  . Cancer Father        oral cancer  . Cancer Sister        colon cancer and lymphoma     Current Outpatient Medications:  .  acetaminophen (TYLENOL) 500 MG tablet, Take 1,000 mg by mouth every 6 (six) hours as needed for mild pain., Disp: , Rfl:  .  ALPRAZolam (XANAX) 0.5 MG tablet, Take 0.5 mg by mouth at bedtime as needed for anxiety., Disp: , Rfl:  .  amLODipine (NORVASC) 5 MG tablet, Take 5 mg by mouth daily.  , Disp: , Rfl:  .  Calcium Carb-Cholecalciferol (CALCIUM 500/D) 500-400 MG-UNIT CHEW, Chew 1,200 Units by mouth 2 (two) times daily., Disp: , Rfl:  .  citalopram (CELEXA) 20 MG tablet, Take 20 mg by mouth daily., Disp: , Rfl:  .  diclofenac (VOLTAREN) 50 MG EC tablet, Take 50 mg by mouth. , Disp: , Rfl:  .  exemestane (AROMASIN) 25 MG  tablet, TAKE 1 TABLET BY MOUTH ONCE A DAY AFTER BREAKFAST, Disp: 90 tablet, Rfl: 1 .  fluticasone (FLONASE) 50 MCG/ACT nasal spray, Place 2 sprays into both nostrils daily., Disp: , Rfl:  .  hydrochlorothiazide (HYDRODIURIL) 25 MG  tablet, Take 25 mg by mouth daily., Disp: , Rfl:  .  montelukast (SINGULAIR) 10 MG tablet, Take 10 mg by mouth at bedtime., Disp: , Rfl:  .  rosuvastatin (CRESTOR) 10 MG tablet, Take 1 tablet by mouth daily., Disp: , Rfl:  .  pregabalin (LYRICA) 75 MG capsule, Take 1 capsule (75 mg total) by mouth 2 (two) times daily. (Patient not taking: Reported on 05/12/2018), Disp: 60 capsule, Rfl: 2  Physical exam:  Vitals:   05/12/18 1425 05/12/18 1432  BP:  122/71  Pulse:  64  Resp: 20   Temp:  (!) 97 F (36.1 C)  TempSrc:  Tympanic  Weight: 184 lb (83.5 kg)   Height: 5' 4"  (1.626 m)    Physical Exam Constitutional:      General: She is not in acute distress. HENT:     Head: Normocephalic and atraumatic.  Eyes:     Pupils: Pupils are equal, round, and reactive to light.  Neck:     Musculoskeletal: Normal range of motion.  Cardiovascular:     Rate and Rhythm: Normal rate and regular rhythm.     Heart sounds: Normal heart sounds.  Pulmonary:     Effort: Pulmonary effort is normal.     Breath sounds: Normal breath sounds.  Abdominal:     General: Bowel sounds are normal.     Palpations: Abdomen is soft.  Skin:    General: Skin is warm and dry.  Neurological:     Mental Status: She is alert and oriented to person, place, and time.    Breast exam was performed in seated and lying down position. Patient is status post left lumpectomy with a well-healed surgical scar. No evidence of any palpable masses. No evidence of axillary adenopathy. No evidence of any palpable masses or lumps in the right breast. No evidence of right axillary adenopathy   CMP Latest Ref Rng & Units 04/22/2017  Glucose 65 - 99 mg/dL 122(H)  BUN 6 - 20 mg/dL 18  Creatinine 0.44 - 1.00  mg/dL 0.77  Sodium 135 - 145 mmol/L 136  Potassium 3.5 - 5.1 mmol/L 3.6  Chloride 101 - 111 mmol/L 98(L)  CO2 22 - 32 mmol/L 29  Calcium 8.9 - 10.3 mg/dL 9.6  Total Protein 6.5 - 8.1 g/dL 7.9  Total Bilirubin 0.3 - 1.2 mg/dL 0.9  Alkaline Phos 38 - 126 U/L 110  AST 15 - 41 U/L 33  ALT 14 - 54 U/L 35   CBC Latest Ref Rng & Units 04/22/2017  WBC 3.6 - 11.0 K/uL 4.6  Hemoglobin 12.0 - 16.0 g/dL 13.4  Hematocrit 35.0 - 47.0 % 39.8  Platelets 150 - 440 K/uL 212    No images are attached to the encounter.  Dg Bone Density  Result Date: 05/09/2018 EXAM: DUAL X-RAY ABSORPTIOMETRY (DXA) FOR BONE MINERAL DENSITY IMPRESSION: Technologist: MTB Your patient Caitriona Sundquist completed a BMD test on 05/09/2018 using the [place] (analysis version: 14.10) manufactured by [employer]. The following summarizes the results of our evaluation. PATIENT BIOGRAPHICAL: Name: Sheyanne, Munley Patient ID:  [id number] Birth Date: [birth year] Height:     64.0 in. Gender:      Female Exam Date:  05/09/2018 Weight:     183.3 lbs. Indications: Advanced Age, Breast CA, History of Breast Cancer, History of Radiation, Osteoarthritis, Postmenopausal Fractures: Treatments: Calcium, Exemestane, Flonase, Singulair, Vitamin D ASSESSMENT: The BMD measured at Femur Neck Right is 0.822 g/cm2 with a T-score of -1.6. This  patient is considered OSTEOPENIC according to [facility]) criteria. The quality of the scan is good. L-3 was excluded due to degenerative changes. Site Region Measured Measured WHO Young Adult BMD Date       Age      Classification T-score AP Spine L1-L4 (L3) 05/09/2018 73.1 Normal 1.1 1.324 g/cm2 AP Spine L1-L4 (L3) 05/05/2016 71.1 Normal 1.5 1.373 g/cm2 DualFemur Neck Right 05/09/2018 73.1 Osteopenia -1.6 0.822 g/cm2 DualFemur Neck Right 05/05/2016 71.1 Normal -0.9 0.908 g/cm2 DualFemur Total Mean 05/09/2018 73.1 Normal -0.7 0.917 g/cm2 DualFemur Total Mean 05/05/2016 71.1 Normal -0.3 0.972  g/cm2 World Health Organization [facility]) criteria for post-menopausal, Caucasian Women: Normal:       T-score at or above -1 SD Osteopenia:   T-score between -1 and -2.5 SD Osteoporosis: T-score at or below -2.5 SD RECOMMENDATIONS: 1. All patients should optimize calcium and vitamin D intake. 2. Consider FDA-approved medical therapies in postmenopausal women and men aged 24 years and older, based on the following: a. A hip or vertebral(clinical or morphometric) fracture b. T-score < -2.5 at the femoral neck or spine after appropriate evaluation to exclude secondary causes c. Low bone mass (T-score between -1.0 and -2.5 at the femoral neck or spine) and a 10-year probability of a hip fracture > 3% or a 10-year probability of a major osteoporosis-related fracture > 20% based on the [place]-adapted WHO algorithm d. Clinician judgment and/or patient preferences may indicate treatment for people with 10-year fracture probabilities above or below these levels FOLLOW-UP: People with diagnosed cases of osteoporosis or at high risk for fracture should have regular bone mineral density tests. For patients eligible for Medicare, routine testing is allowed once every 2 years. The testing frequency can be increased to one year for patients who have rapidly progressing disease, those who are receiving or discontinuing medical therapy to restore bone mass, or have additional risk factors. I have reviewed this report, and agree with the above findings. Mark A. Thornton Papas, M.D. [facility] Radiology Technologist: MTB Your patient RAYSHELL GOECKE completed a FRAX assessment on 05/09/2018 using the [place] (analysis version: 14.10) manufactured by [employer]. The following summarizes the results of our evaluation. PATIENT BIOGRAPHICAL: Name: Steffanie, Mingle Patient ID: [id number] Birth Date: 01-23-1946 Height:    64.0 in. Gender:     Female    Age:        72.1       Weight:    183.3 lbs. Ethnicity:  White                             Exam Date: 05/09/2018 FRAX* RESULTS:  (version: 3.5) 10-year Probability of Fracture1 Major Osteoporotic Fracture2 Hip Fracture 10.5% 1.9% Population: [place] (Caucasian) Risk Factors: None Based on Femur (Right) Neck BMD 1 -The 10-year probability of fracture may be lower than reported if the patient has received treatment. 2 -Major Osteoporotic Fracture: Clinical Spine, Forearm, Hip or Shoulder *FRAX is a Materials engineer of the [employer] for Metabolic Bone Disease, a [place] (WHO) [facility]. ASSESSMENT: The probability of a major osteoporotic fracture is 10.5% within the next ten years. The probability of a hip fracture is 1.9% within the next ten years. I have reviewed this report and agree with the above findings. Mark A. Thornton Papas, M.D. [facility] Radiology Electronically Signed   By: Lavonia Dana M.D.   On: 05/09/2018 14:17  Assessment and plan- Patient is a 73 y.o. female a h/o Stage IIb pT2N1a(sn)cM0 invasive lobular carcinoma of the left breast ER 10% positive, PR 10% positive, HER-2/neu negative status post lumpectomy/SLND currently on aromasin.  She is here for routine follow-up of breast cancer  1.  Discussed results of recent bone density scan which showed osteopenia in her right hip. With a T score of -1.6.  10-year probability of a major osteoporotic fracture was 10.5% and hip fracture was 1.9%.  Her prior bone density scan from 2018 showed a score of -1 at the left hip.  Discussed that although she does not have significant osteopenia at this point there has been a decline in her bone density score.  Discussed both oral bisphosphonates such as weekly Fosamax which can have side effects such as reflux and requires patients to sit upright to take the medication and not combine it with other medications versus parenteral bisphosphonates and we could even do Reclast every 18 months.  However she would need dental clearance given the risk of  osteonecrosis of the jaw.  Patient would like to think about these options and let us know if she would like to start on bisphosphonates.  In the meanwhile she has decided to take her calcium and vitamin D more regularly  2. Patient will continue to be on Aromasin for her breast cancer until 2022   Visit Diagnosis 1. Encounter for follow-up surveillance of breast cancer   2. Osteopenia of neck of right femur      Dr. Randa Evens, MD, MPH [facility] at [facility] [phone] 05/13/2018 2:20 PM

## 2018-05-18 DIAGNOSIS — L820 Inflamed seborrheic keratosis: Secondary | ICD-10-CM | POA: Diagnosis not present

## 2018-05-18 DIAGNOSIS — L821 Other seborrheic keratosis: Secondary | ICD-10-CM | POA: Diagnosis not present

## 2018-05-31 DIAGNOSIS — H6123 Impacted cerumen, bilateral: Secondary | ICD-10-CM | POA: Diagnosis not present

## 2018-05-31 DIAGNOSIS — H902 Conductive hearing loss, unspecified: Secondary | ICD-10-CM | POA: Diagnosis not present

## 2018-06-08 ENCOUNTER — Telehealth: Payer: Self-pay | Admitting: *Deleted

## 2018-06-08 DIAGNOSIS — R300 Dysuria: Secondary | ICD-10-CM | POA: Diagnosis not present

## 2018-06-08 DIAGNOSIS — N390 Urinary tract infection, site not specified: Secondary | ICD-10-CM | POA: Diagnosis not present

## 2018-06-08 NOTE — Telephone Encounter (Signed)
Patient went to walk in clinic this morning and was diagnosed with UTI and started on antibiotics Appointment and lab declined

## 2018-06-08 NOTE — Telephone Encounter (Signed)
Patient ca reporting that she  Is having painful urination and was up most of the night. Please advise, she is requesting to come by and do a UA

## 2018-06-08 NOTE — Telephone Encounter (Signed)
Yes definitely. Would she like to be seen?

## 2018-06-29 ENCOUNTER — Other Ambulatory Visit: Payer: Self-pay | Admitting: Oncology

## 2018-07-15 DIAGNOSIS — N390 Urinary tract infection, site not specified: Secondary | ICD-10-CM | POA: Diagnosis not present

## 2018-07-15 DIAGNOSIS — N12 Tubulo-interstitial nephritis, not specified as acute or chronic: Secondary | ICD-10-CM | POA: Diagnosis not present

## 2018-07-15 DIAGNOSIS — I5031 Acute diastolic (congestive) heart failure: Secondary | ICD-10-CM | POA: Diagnosis not present

## 2018-07-15 DIAGNOSIS — I4891 Unspecified atrial fibrillation: Secondary | ICD-10-CM | POA: Diagnosis not present

## 2018-07-15 DIAGNOSIS — N179 Acute kidney failure, unspecified: Secondary | ICD-10-CM | POA: Diagnosis not present

## 2018-07-15 DIAGNOSIS — I10 Essential (primary) hypertension: Secondary | ICD-10-CM | POA: Diagnosis not present

## 2018-07-15 DIAGNOSIS — R399 Unspecified symptoms and signs involving the genitourinary system: Secondary | ICD-10-CM | POA: Diagnosis not present

## 2018-07-15 DIAGNOSIS — K529 Noninfective gastroenteritis and colitis, unspecified: Secondary | ICD-10-CM | POA: Diagnosis not present

## 2018-07-15 DIAGNOSIS — M25561 Pain in right knee: Secondary | ICD-10-CM | POA: Diagnosis not present

## 2018-07-15 DIAGNOSIS — E876 Hypokalemia: Secondary | ICD-10-CM | POA: Diagnosis not present

## 2018-08-18 ENCOUNTER — Ambulatory Visit (INDEPENDENT_AMBULATORY_CARE_PROVIDER_SITE_OTHER): Payer: PPO | Admitting: Orthopaedic Surgery

## 2018-08-18 ENCOUNTER — Encounter: Payer: Self-pay | Admitting: Orthopaedic Surgery

## 2018-08-18 ENCOUNTER — Other Ambulatory Visit: Payer: Self-pay

## 2018-08-18 DIAGNOSIS — M1711 Unilateral primary osteoarthritis, right knee: Secondary | ICD-10-CM

## 2018-08-18 DIAGNOSIS — M1811 Unilateral primary osteoarthritis of first carpometacarpal joint, right hand: Secondary | ICD-10-CM | POA: Diagnosis not present

## 2018-08-18 DIAGNOSIS — M1712 Unilateral primary osteoarthritis, left knee: Secondary | ICD-10-CM | POA: Diagnosis not present

## 2018-08-18 MED ORDER — DICLOFENAC SODIUM 50 MG PO TBEC: 50.0000 mg | DELAYED_RELEASE_TABLET | Freq: Every day | ORAL | 3 refills | Status: DC

## 2018-08-18 MED ORDER — DICLOFENAC SODIUM 1 % TD GEL: 2.0000 g | Freq: Four times a day (QID) | TRANSDERMAL | 1 refills | Status: DC

## 2018-08-18 NOTE — Progress Notes (Signed)
Office Visit Note   Patient: Jamie Curry           Date of Birth: [birth year]           MRN: [id number] Visit Date: 08/18/2018              Requested by: Marton Redwood, MD [address] [place], [place] [address] PCP: Marton Redwood, MD   Assessment & Plan: Visit Diagnoses: No diagnosis found.  Plan: She will continue continue to take her diclofenac as needed.  In regards to her knee she will follow-up as needed for periodic injections.  In regards to her right thumb recommend topical Voltaren gel 2 g up to 4 times daily.  She will follow-up with [place] in regards to her thumb as needed.  Questions were encouraged and answered.  Follow-Up Instructions: No follow-ups on file.   Orders:  No orders of the defined types were placed in this encounter.  Meds ordered this encounter  Medications  . diclofenac (VOLTAREN) 50 MG EC tablet    Sig: Take 1 tablet (50 mg total) by mouth daily.    Dispense:  30 tablet    Refill:  3      Procedures: No procedures performed   Clinical Data: No additional findings.   Subjective: Chief Complaint  Patient presents with  . Left Knee - Pain, Follow-up  . Right Knee - Pain, Follow-up  . Right Thumb - Pain    HPI Mrs. Warman is well-known to Dr. Ninfa Linden service has known moderate arthritis bilateral knees.  She last underwent Monovisc injections both knees on 2419.  States knee is overall doing well she just wants it checked to make sure that she does not have fluid on either knee.  She had no new injury diagnosed.  She has occasional aching pain in the knees but if she takes her diclofenac which she takes periodically this seems to take care of the pain.  She is also having pain in the right thumb and is wondering what can be done about this.  She relates that she is had no new injury to the thumb.  Pain is 3 out of 10 at worst and again if she takes her diclofenac this definitely helps with her thumb pain.  Review of Systems No fevers or  chills shortness of breath.  Objective: Vital Signs: There were no vitals taken for this visit.  Physical Exam Constitutional:      Appearance: She is not ill-appearing or diaphoretic.  Pulmonary:     Effort: Pulmonary effort is normal.  Neurological:     Mental Status: She is alert and oriented to person, place, and time.  Psychiatric:        Mood and Affect: Mood normal.        Behavior: Behavior normal.     Ortho Exam Bilateral knees good range of motion.  Right knee nontender.  Left knee slight tenderness peripatellar region.  No effusion abnormal warmth erythema of either knee.  No instability valgus varus stressing of either knee.  Specialty Comments:  No specialty comments available.  Imaging: No results found.   PMFS History: Patient Active Problem List   Diagnosis Date Noted  . Unilateral primary osteoarthritis, left knee 08/19/2016  . Unilateral primary osteoarthritis, right knee 08/19/2016  . Hot flashes 03/25/2016  . Breast cancer of upper-outer quadrant of left female breast ([place]) 02/14/2016  . Hyperlipidemia 11/12/2011  . HTN (hypertension) 12/29/2010  . Palpitations 12/29/2010   Past Medical  History:  Diagnosis Date  . Anxiety   . Asthma   . Breast cancer ([place]) 02/07/2016   left breast invasive lobular carcinoma  . CAD (coronary artery disease)    mild CAD by cath in 2001  . Depression   . Diverticulosis   . Heart murmur   . HTN (hypertension)   . Hyperlipidemia   . Palpitations   . Personal history of radiation therapy 2018   left breast ca  . Syncope    Pre-syncope    Family History  Problem Relation Age of Onset  . Cancer Father        oral cancer  . Cancer Sister        colon cancer and lymphoma    Past Surgical History:  Procedure Laterality Date  . ABDOMINAL HYSTERECTOMY    . BREAST BIOPSY Left 02/07/2016   grade II invasive and in situ mammary carcinoma  . BREAST CYST ASPIRATION Left 02/13/2016   cyst  . BREAST LUMPECTOMY  Left 03/03/2016   invasive lobular carcinoma, clear margins, METASTATIC LOBULAR CARCINOMA, 2.5 MM, IN ONE LYMPH NODE (1/1).   . CHOLECYSTECTOMY    . KNEE ARTHROSCOPY Right   . PARTIAL MASTECTOMY WITH NEEDLE LOCALIZATION Left 03/03/2016   Procedure: PARTIAL MASTECTOMY WITH NEEDLE LOCALIZATION;  Surgeon: Leonie Green, MD;  Location: [facility] ORS;  Service: General;  Laterality: Left;  . SENTINEL NODE BIOPSY Left 03/03/2016   Procedure: SENTINEL NODE BIOPSY;  Surgeon: Leonie Green, MD;  Location: [facility] ORS;  Service: General;  Laterality: Left;  . TUBAL LIGATION     Social History   Occupational History  . Not on file  Tobacco Use  . Smoking status: Never Smoker  . Smokeless tobacco: Never Used  Substance and Sexual Activity  . Alcohol use: No    Alcohol/week: 0.0 standard drinks  . Drug use: No  . Sexual activity: Not on file

## 2018-08-31 ENCOUNTER — Other Ambulatory Visit: Payer: Self-pay

## 2018-09-01 ENCOUNTER — Ambulatory Visit
Admission: RE | Admit: 2018-09-01 | Discharge: 2018-09-01 | Disposition: A | Discharge status: Home / Self Care | Payer: PPO | Source: Ambulatory Visit | Attending: Radiation Oncology | Admitting: Radiation Oncology

## 2018-09-01 ENCOUNTER — Other Ambulatory Visit: Payer: Self-pay

## 2018-09-01 ENCOUNTER — Encounter: Payer: Self-pay | Admitting: Radiation Oncology

## 2018-09-01 VITALS — BP 144/72 | HR 62 | Temp 98.0°F | Resp 18 | Wt 179.7 lb

## 2018-09-01 DIAGNOSIS — Z170 Estrogen receptor positive status [ER+]: Secondary | ICD-10-CM | POA: Diagnosis not present

## 2018-09-01 DIAGNOSIS — Z923 Personal history of irradiation: Secondary | ICD-10-CM | POA: Insufficient documentation

## 2018-09-01 DIAGNOSIS — C50412 Malignant neoplasm of upper-outer quadrant of left female breast: Secondary | ICD-10-CM | POA: Diagnosis not present

## 2018-09-01 DIAGNOSIS — Z79811 Long term (current) use of aromatase inhibitors: Secondary | ICD-10-CM | POA: Insufficient documentation

## 2018-09-01 NOTE — Progress Notes (Signed)
Radiation Oncology Follow up Note  Name: Jamie Curry   Date:   09/01/2018 MRN:  [id number] DOB: [birth year]    This 73 y.o. female presents to the clinic today for 2-year follow-up status post whole breast radiation for stage IIb (T2 N1 M0 invasive lobular carcinoma weakly ER PR positive HER-2/neu negative.  REFERRING PROVIDER: Marton Redwood, MD  HPI: Patient is a 73 year old female now out 2 years having completed whole breast radiation to her left breast for invasive lobular carcinoma weakly ER PR positive HER-2 negative.  She is seen today in routine follow-up and is doing well.  She is currently on Aromasin and tolerating that well without side effect.  She specifically denies breast tenderness cough or bone pain..  She had mammograms back in October which I have reviewed were BI-RADS 2 benign.  COMPLICATIONS OF TREATMENT: none FOLLOW UP COMPLIANCE: keeps appointments   PHYSICAL EXAM:  BP (!) 144/72   Pulse 62   Temp 98 F (36.7 C)   Resp 18   Wt 179 lb 10.8 oz (81.5 kg)   BMI 30.84 kg/m  Lungs are clear to A&P cardiac examination essentially unremarkable with regular rate and rhythm. No dominant mass or nodularity is noted in either breast in 2 positions examined. Incision is well-healed. No axillary or supraclavicular adenopathy is appreciated. Cosmetic result is excellent.  Well-developed well-nourished patient in NAD. HEENT reveals PERLA, EOMI, discs not visualized.  Oral cavity is clear. No oral mucosal lesions are identified. Neck is clear without evidence of cervical or supraclavicular adenopathy. Lungs are clear to A&P. Cardiac examination is essentially unremarkable with regular rate and rhythm without murmur rub or thrill. Abdomen is benign with no organomegaly or masses noted. Motor sensory and DTR levels are equal and symmetric in the upper and lower extremities. Cranial nerves II through XII are grossly intact. Proprioception is intact. No peripheral adenopathy or edema is  identified. No motor or sensory levels are noted. Crude visual fields are within normal range.  RADIOLOGY RESULTS: Mammograms reviewed compatible with above-stated findings  PLAN: Present time patient is doing well with no evidence of disease.  I am pleased with her overall progress.  She continues on Aromasin without side effect.  She is already been scheduled for follow-up mammograms.  Dr. Janese Banks is following her bone density and had a bone density study recently.  I have asked to see her back in 1 year for follow-up.  Patient knows to call sooner with any concerns.  I would like to take this opportunity to thank you for allowing me to participate in the care of your patient.Noreene Filbert, MD

## 2018-09-02 DIAGNOSIS — I6521 Occlusion and stenosis of right carotid artery: Secondary | ICD-10-CM | POA: Diagnosis not present

## 2018-09-02 DIAGNOSIS — Z0000 Encounter for general adult medical examination without abnormal findings: Secondary | ICD-10-CM | POA: Diagnosis not present

## 2018-09-02 DIAGNOSIS — M8580 Other specified disorders of bone density and structure, unspecified site: Secondary | ICD-10-CM | POA: Diagnosis not present

## 2018-09-02 DIAGNOSIS — R7301 Impaired fasting glucose: Secondary | ICD-10-CM | POA: Diagnosis not present

## 2018-09-02 DIAGNOSIS — M859 Disorder of bone density and structure, unspecified: Secondary | ICD-10-CM | POA: Diagnosis not present

## 2018-09-02 DIAGNOSIS — E7849 Other hyperlipidemia: Secondary | ICD-10-CM | POA: Diagnosis not present

## 2018-09-02 DIAGNOSIS — E782 Mixed hyperlipidemia: Secondary | ICD-10-CM | POA: Diagnosis not present

## 2018-09-02 DIAGNOSIS — I2510 Atherosclerotic heart disease of native coronary artery without angina pectoris: Secondary | ICD-10-CM | POA: Diagnosis not present

## 2018-09-02 DIAGNOSIS — D692 Other nonthrombocytopenic purpura: Secondary | ICD-10-CM | POA: Diagnosis not present

## 2018-09-02 DIAGNOSIS — F3341 Major depressive disorder, recurrent, in partial remission: Secondary | ICD-10-CM | POA: Diagnosis not present

## 2018-09-02 DIAGNOSIS — I10 Essential (primary) hypertension: Secondary | ICD-10-CM | POA: Diagnosis not present

## 2018-09-02 DIAGNOSIS — E669 Obesity, unspecified: Secondary | ICD-10-CM | POA: Diagnosis not present

## 2018-09-02 DIAGNOSIS — C50919 Malignant neoplasm of unspecified site of unspecified female breast: Secondary | ICD-10-CM | POA: Diagnosis not present

## 2018-09-08 ENCOUNTER — Telehealth (HOSPITAL_COMMUNITY): Payer: Self-pay

## 2018-09-08 NOTE — Telephone Encounter (Signed)
The above patient or their representative was contacted and gave the following answers to these questions:         Do you have any of the following symptoms?  NO  Fever                    Cough                   Shortness of breath  Do  you have any of the following other symptoms?    muscle pain         vomiting,        diarrhea        rash         weakness        red eye        abdominal pain         bruising          bruising or bleeding              joint pain           severe headache    Have you been in contact with someone who was or has been sick in the past 2 weeks?  NO  Yes                 Unsure                         Unable to assess   Does the person that you were in contact with have any of the following symptoms?   Cough         shortness of breath           muscle pain         vomiting,            diarrhea            rash            weakness           fever            red eye           abdominal pain           bruising  or  bleeding                joint pain                severe headache               Have you  or someone you have been in contact with traveled internationally in th last month?  NO       If yes, which countries?   Have you  or someone you have been in contact with traveled outside [place] in th last month?   NO      If yes, which state and city?   COMMENTS OR ACTION PLAN FOR THIS PATIENT:         

## 2018-09-09 ENCOUNTER — Ambulatory Visit (HOSPITAL_COMMUNITY)
Admission: RE | Admit: 2018-09-09 | Discharge: 2018-09-09 | Disposition: A | Discharge status: Home / Self Care | Payer: PPO | Source: Ambulatory Visit | Attending: Family | Admitting: Family

## 2018-09-09 ENCOUNTER — Other Ambulatory Visit (HOSPITAL_COMMUNITY): Payer: Self-pay | Admitting: Internal Medicine

## 2018-09-09 ENCOUNTER — Other Ambulatory Visit: Payer: Self-pay

## 2018-09-09 DIAGNOSIS — I6521 Occlusion and stenosis of right carotid artery: Secondary | ICD-10-CM | POA: Diagnosis not present

## 2018-10-06 ENCOUNTER — Telehealth: Payer: Self-pay | Admitting: *Deleted

## 2018-10-06 NOTE — Telephone Encounter (Signed)
Looks like patient is on lyrica and citalopram. Can you confirm that with her? If she is- she can hold her aromasin and see if it helps for 2-3 weeks. Oxybutinin 5 to 10 mg daily can be tried but it can cause dry mouth. None of these therapies are very effective

## 2018-10-06 NOTE — Telephone Encounter (Signed)
Patient states she has not been on Lyrica for a long time because it made her feel funny, she only took a couple of doses. I asked her to hold her Aromasin for 2-3 weeks and told her I would let her know what else you want to do since she is not on Lyrica

## 2018-10-06 NOTE — Telephone Encounter (Signed)
Lyrica/ gabapentin/ citalopram are typical 1st line remedies for hot flashes. She can retry lyrica since she did not tolerate gabapentin

## 2018-10-06 NOTE — Telephone Encounter (Signed)
Patient called reporting that she is having severe hot flashes sweating to point of having to change tops several times a day.Please advise

## 2018-10-06 NOTE — Telephone Encounter (Addendum)
She did not want the Lyrica. Made her feel funny. She is on Citalopram 20 mg daily

## 2018-10-27 ENCOUNTER — Telehealth: Payer: Self-pay | Admitting: *Deleted

## 2018-10-27 NOTE — Telephone Encounter (Signed)
Patient called reporting that since yesterday she has had no episodes of sweating. She has been off AI for almost 2 1/2 weeks now. She asks what to do now? Please advise

## 2018-10-28 MED ORDER — TAMOXIFEN CITRATE 20 MG PO TABS: 20.0000 mg | ORAL_TABLET | Freq: Every day | ORAL | 0 refills | Status: DC

## 2018-10-28 NOTE — Telephone Encounter (Signed)
If they are bothersome she can try paroxetine 7.5 mg daily

## 2018-10-28 NOTE — Telephone Encounter (Signed)
I spoke with patient and she is agreeable to try Tamoxifen and will start it tomorrow morning. Prescription sent to [facility] per patient request

## 2018-10-28 NOTE — Telephone Encounter (Signed)
She can stay off AI and go for tamoxifen 20 mg daily at this time

## 2018-10-28 NOTE — Telephone Encounter (Signed)
She was taken off AI an wanted you to know that she is better with her symptoms Is she to change to another AI or Tamoxifen, or stay off AI altogether?

## 2018-11-10 ENCOUNTER — Inpatient Hospital Stay: Payer: PPO | Attending: Oncology | Admitting: Oncology

## 2018-11-10 ENCOUNTER — Other Ambulatory Visit: Payer: Self-pay

## 2018-11-10 ENCOUNTER — Encounter: Payer: Self-pay | Admitting: Oncology

## 2018-11-10 VITALS — BP 144/75 | HR 70 | Temp 98.1°F | Resp 18 | Wt 182.4 lb

## 2018-11-10 DIAGNOSIS — Z170 Estrogen receptor positive status [ER+]: Secondary | ICD-10-CM | POA: Insufficient documentation

## 2018-11-10 DIAGNOSIS — N951 Menopausal and female climacteric states: Secondary | ICD-10-CM | POA: Diagnosis not present

## 2018-11-10 DIAGNOSIS — Z5181 Encounter for therapeutic drug level monitoring: Secondary | ICD-10-CM

## 2018-11-10 DIAGNOSIS — Z79810 Long term (current) use of selective estrogen receptor modulators (SERMs): Secondary | ICD-10-CM

## 2018-11-10 DIAGNOSIS — C50412 Malignant neoplasm of upper-outer quadrant of left female breast: Secondary | ICD-10-CM | POA: Diagnosis not present

## 2018-11-10 MED ORDER — TAMOXIFEN CITRATE 20 MG PO TABS: 20.0000 mg | ORAL_TABLET | Freq: Every day | ORAL | 3 refills | Status: DC

## 2018-11-10 NOTE — Progress Notes (Signed)
Patient is here for follow up, she is doing well no complaints Would like to know the difference between the Aromasin and Tamoxifen

## 2018-11-13 NOTE — Progress Notes (Signed)
Hematology/Oncology Consult note [facility]  Telephone:([phone] [phone]  Patient Care Team: Marton Redwood, MD as PCP - General (Internal Medicine)   Name of the patient: Jamie Curry  [id number]  Feb 12, 1946   Date of visit: 11/13/18  Diagnosis- Stage IIb pT2N1a(sn)cM0 invasive lobular carcinoma of the left breast ER 10% positive, PR 10% positive, HER-2/neu negative status post lumpectomy/SLND  Chief complaint/ Reason for visit- routine f/u of breast cancer  Heme/Onc history:  Oncology History  Breast cancer of upper-outer quadrant of left female breast ([place])  02/15/2008 Imaging   Small  probable mildly complicated cysts within the upper outer quadrant right breast at the 11 o'clock position in the region of questionable nodularity noted on mammography.  Recommend follow-up right breast diagnostic mammogram and ultrasound in 6 months.      08/13/2008 Imaging   Ultrasound is performed, showing stable small minimal complicated cysts at the right breast 11 o'clock position 4 cm from the nipple, not changed compared prior exam   09/23/2010 Imaging   1.3 cm simple cyst located within the left breast at the 12 o'clock position corresponding to the mammographic finding.  No findings worrisome for malignancy.  Recommend annual screening mammography   09/23/2010 Mammogram   1.3 cm simple cyst located within the left breast at the 12 o'clock position corresponding to the mammographic finding.  No findings worrisome for malignancy.    02/05/2015 Imaging   A 2.5 x 3.5 cm oval circumscribed mass within the upper retroareolar left breast is identified. No suspicious calcifications or distortion identified.  On physical exam, a firm palpable mobile mass is identified at the 11:30 position of the left breast 4 cm from the nipple. Targeted ultrasound is performed, showing a 2.8 x 1.4 x 3.6 cm simple cyst at the 11:30 position of the left breast,  corresponding to the screening study finding.   02/07/2016 Imaging   Targeted ultrasound of the left breast was performed demonstrating an irregular shadowing hypoechoic mass at 1 o'clock 5 cm from nipple measuring approximately 2.3 x 1 x 1.6 cm. This corresponds well with mammography findings. No lymphadenopathy seen in the left axilla.   02/07/2016 Mammogram   Note that the patient has a stable oval circumscribed mass in the retroareolar left breast previously characterized as a cyst. Although this cyst appears stable when compared to 2016 it is overall increasing in size and therefore given the patient's highly suspicious mass in the upper-outer left breast aspiration of the retroareolar left breast cyst is warranted.   02/07/2016 Procedure   She had US guided biopsy of breast mass   02/07/2016 Pathology Results   Accession: [id number]: Biopsy confirmed invasive breast cancer, ER 10% positive, PR 10% positive and Her2/neu negative, Ki 67 15%  Additional molecular study with MammaPrint result came back low risk (10% risk of recurrence in 10 years)   02/27/2016 Imaging   MR breast showed area of known malignancy in the upper-outer quadrant of the left breast, measuring maximum of 4.0 cm (anterior-posterior) an associated clip artifact. No findings to indicate multicentric, multifocal, or contralateral malignancy. No MRI evidence for adenopathy   03/03/2016 Surgery   She underwent left partial mastectomy and sentinel lymph node biopsy   03/03/2016 Pathology Results   CASE: [id number] Invasive lobular carcinoma 2.3 cm, Histologic Grade (Nottingham Histologic Score) Glandular (Acinar)/Tubular Differentiation: Score 3 Nuclear Pleomorphism: Score 2 Mitotic Rate: Score 1 Total score: 6 Overall Grade: Grade 2 Tumor Size: 23 mm. Size of  lymph node invasion is 2.5 mm    Patient had intolerable hot flashes with ai and was swiched to tamoxifen  Interval history-patient started taking tamoxifen  a few weeks ago after she had intolerable hot flashes with AI.  She still has some hot flashes but they are not as bad.  She has also been unable to get medication for gabapentin for her hot flashes given prior episodes of confusion.  ECOG PS- 1 Pain scale- 0   Review of systems- Review of Systems  Constitutional: Negative for chills, fever, malaise/fatigue and weight loss.  HENT: Negative for congestion, ear discharge and nosebleeds.   Eyes: Negative for blurred vision.  Respiratory: Negative for cough, hemoptysis, sputum production, shortness of breath and wheezing.   Cardiovascular: Negative for chest pain, palpitations, orthopnea and claudication.  Gastrointestinal: Negative for abdominal pain, blood in stool, constipation, diarrhea, heartburn, melena, nausea and vomiting.  Genitourinary: Negative for dysuria, flank pain, frequency, hematuria and urgency.  Musculoskeletal: Negative for back pain, joint pain and myalgias.  Skin: Negative for rash.  Neurological: Negative for dizziness, tingling, focal weakness, seizures, weakness and headaches.  Endo/Heme/Allergies: Does not bruise/bleed easily.       Hot flashes  Psychiatric/Behavioral: Negative for depression and suicidal ideas. The patient does not have insomnia.        Allergies  Allergen Reactions   Penicillins Anaphylaxis    REACTION: severe anaphylaxis Has patient had a PCN reaction causing immediate rash, facial/tongue/throat swelling, SOB or lightheadedness with hypotension: Yes Has patient had a PCN reaction causing severe rash involving mucus membranes or skin necrosis: No Has patient had a PCN reaction that required hospitalization No Has patient had a PCN reaction occurring within the last 10 years: No If all of the above answers are "NO", then may proceed with Cephalosporin use.    Zithromax [Azithromycin] Rash    z-pack = rash   Anastrozole Other (See Comments)    Extreme sweating   Cefuroxime Axetil Other  (See Comments)    unknown   Cefuroxime Axetil Other (See Comments)   Codeine Other (See Comments)    unknown   Erythromycin Other (See Comments)    unknown   Nitrofurantoin Other (See Comments)    unknown   Ofloxacin Other (See Comments)    unknown   Propoxyphene Other (See Comments)   Propoxyphene N-Acetaminophen Other (See Comments)    unknown   Sulfonamide Derivatives Other (See Comments)    unknown   Tetracycline Other (See Comments)    unknown   Sulfa Antibiotics Other (See Comments) and Rash     Past Medical History:  Diagnosis Date   Anxiety    Asthma    Breast cancer ([place]) 02/07/2016   left breast invasive lobular carcinoma   CAD (coronary artery disease)    mild CAD by cath in 2001   Depression    Diverticulosis    Heart murmur    HTN (hypertension)    Hyperlipidemia    Palpitations    Personal history of radiation therapy 2018   left breast ca   Syncope    Pre-syncope     Past Surgical History:  Procedure Laterality Date   ABDOMINAL HYSTERECTOMY     BREAST BIOPSY Left 02/07/2016   grade II invasive and in situ mammary carcinoma   BREAST CYST ASPIRATION Left 02/13/2016   cyst   BREAST LUMPECTOMY Left 03/03/2016   invasive lobular carcinoma, clear margins, METASTATIC LOBULAR CARCINOMA, 2.5 MM, IN ONE LYMPH NODE (1/1).  CHOLECYSTECTOMY     KNEE ARTHROSCOPY Right    PARTIAL MASTECTOMY WITH NEEDLE LOCALIZATION Left 03/03/2016   Procedure: PARTIAL MASTECTOMY WITH NEEDLE LOCALIZATION;  Surgeon: Leonie Green, MD;  Location: [facility] ORS;  Service: General;  Laterality: Left;   SENTINEL NODE BIOPSY Left 03/03/2016   Procedure: SENTINEL NODE BIOPSY;  Surgeon: Leonie Green, MD;  Location: [facility] ORS;  Service: General;  Laterality: Left;   TUBAL LIGATION      Social History   Socioeconomic History   Marital status: Married    Spouse name: Not on file   Number of children: Not on file   Years of education:  Not on file   Highest education level: Not on file  Occupational History   Not on file  Social Needs   Financial resource strain: Not on file   Food insecurity    Worry: Not on file    Inability: Not on file   Transportation needs    Medical: Not on file    Non-medical: Not on file  Tobacco Use   Smoking status: Never Smoker   Smokeless tobacco: Never Used  Substance and Sexual Activity   Alcohol use: No    Alcohol/week: 0.0 standard drinks   Drug use: No   Sexual activity: Not on file  Lifestyle   Physical activity    Days per week: Not on file    Minutes per session: Not on file   Stress: Not on file  Relationships   Social connections    Talks on phone: Not on file    Gets together: Not on file    Attends religious service: Not on file    Active member of club or organization: Not on file    Attends meetings of clubs or organizations: Not on file    Relationship status: Not on file   Intimate partner violence    Fear of current or ex partner: Not on file    Emotionally abused: Not on file    Physically abused: Not on file    Forced sexual activity: Not on file  Other Topics Concern   Not on file  Social History Narrative   Not on file    Family History  Problem Relation Age of Onset   Cancer Father        oral cancer   Cancer Sister        colon cancer and lymphoma     Current Outpatient Medications:    acetaminophen (TYLENOL) 500 MG tablet, Take 1,000 mg by mouth every 6 (six) hours as needed for mild pain., Disp: , Rfl:    ALPRAZolam (XANAX) 0.5 MG tablet, Take 0.5 mg by mouth at bedtime as needed for anxiety., Disp: , Rfl:    amLODipine (NORVASC) 5 MG tablet, Take 5 mg by mouth daily.  , Disp: , Rfl:    Calcium Carb-Cholecalciferol (CALCIUM 500/D) 500-400 MG-UNIT CHEW, Chew 1,200 Units by mouth 2 (two) times daily., Disp: , Rfl:    citalopram (CELEXA) 20 MG tablet, Take 20 mg by mouth daily., Disp: , Rfl:    diclofenac  (VOLTAREN) 50 MG EC tablet, Take 1 tablet (50 mg total) by mouth daily., Disp: 30 tablet, Rfl: 3   diclofenac sodium (VOLTAREN) 1 % GEL, Apply 2 g topically 4 (four) times daily., Disp: 1 Tube, Rfl: 1   fluticasone (FLONASE) 50 MCG/ACT nasal spray, Place 2 sprays into both nostrils daily., Disp: , Rfl:    hydrochlorothiazide (HYDRODIURIL) 25 MG tablet, Take  25 mg by mouth daily., Disp: , Rfl:    montelukast (SINGULAIR) 10 MG tablet, Take 10 mg by mouth at bedtime., Disp: , Rfl:    tamoxifen (NOLVADEX) 20 MG tablet, Take 1 tablet (20 mg total) by mouth daily., Disp: 90 tablet, Rfl: 3   rosuvastatin (CRESTOR) 10 MG tablet, Take 1 tablet by mouth daily., Disp: , Rfl:   Physical exam:  Vitals:   11/10/18 1425  BP: (!) 144/75  Pulse: 70  Resp: 18  Temp: 98.1 F (36.7 C)  TempSrc: Tympanic  Weight: 182 lb 6.4 oz (82.7 kg)   Physical Exam Constitutional:      General: She is not in acute distress. HENT:     Head: Normocephalic and atraumatic.  Eyes:     Pupils: Pupils are equal, round, and reactive to light.  Neck:     Musculoskeletal: Normal range of motion.  Cardiovascular:     Rate and Rhythm: Normal rate and regular rhythm.     Heart sounds: Normal heart sounds.  Pulmonary:     Effort: Pulmonary effort is normal.     Breath sounds: Normal breath sounds.  Abdominal:     General: Bowel sounds are normal.     Palpations: Abdomen is soft.  Skin:    General: Skin is warm and dry.  Neurological:     Mental Status: She is alert and oriented to person, place, and time.    Breast exam was performed in seated and lying down position. Patient is status post left lumpectomy with a well-healed surgical scar. No evidence of any palpable masses. No evidence of axillary adenopathy. No evidence of any palpable masses or lumps in the right breast. No evidence of right axillary adenopathy   CMP Latest Ref Rng & Units 04/22/2017  Glucose 65 - 99 mg/dL 122(H)  BUN 6 - 20 mg/dL 18    Creatinine 0.44 - 1.00 mg/dL 0.77  Sodium 135 - 145 mmol/L 136  Potassium 3.5 - 5.1 mmol/L 3.6  Chloride 101 - 111 mmol/L 98(L)  CO2 22 - 32 mmol/L 29  Calcium 8.9 - 10.3 mg/dL 9.6  Total Protein 6.5 - 8.1 g/dL 7.9  Total Bilirubin 0.3 - 1.2 mg/dL 0.9  Alkaline Phos 38 - 126 U/L 110  AST 15 - 41 U/L 33  ALT 14 - 54 U/L 35   CBC Latest Ref Rng & Units 04/22/2017  WBC 3.6 - 11.0 K/uL 4.6  Hemoglobin 12.0 - 16.0 g/dL 13.4  Hematocrit 35.0 - 47.0 % 39.8  Platelets 150 - 440 K/uL 212       Assessment and plan- Patient is a 73 y.o. female a h/o Stage IIb pT2N1a(sn)cM0 invasive lobular carcinoma of the left breast ER 10% positive, PR 10% positive, HER-2/neu negative status post lumpectomy/SLND. She is currently on tamoxifen and is here for routine f/u  Clinically patient is doing well and no evidence of recurrence on today's exam.  She will be due for Mammogram in October 2020 which will be coordinated by Dr. Peyton Najjar.  Currently she is on tamoxifen given intolerable hot flashes with AI which she seems to be tolerating okay.  She does still have hot flashes but they are not as bad as they were before and she does not desire any medications at this time.  I will see him back in 6 months time with a CBC with differential and CMP     Visit Diagnosis 1. Malignant neoplasm of upper-outer quadrant of left breast in female, estrogen  receptor positive ([place])   2. Encounter for monitoring tamoxifen therapy      Dr. Randa Evens, MD, MPH [facility] at [facility] [phone] 11/13/2018 9:50 AM

## 2018-11-28 DIAGNOSIS — H6123 Impacted cerumen, bilateral: Secondary | ICD-10-CM | POA: Diagnosis not present

## 2018-11-28 DIAGNOSIS — H902 Conductive hearing loss, unspecified: Secondary | ICD-10-CM | POA: Diagnosis not present

## 2018-12-05 DIAGNOSIS — H40003 Preglaucoma, unspecified, bilateral: Secondary | ICD-10-CM | POA: Diagnosis not present

## 2018-12-09 ENCOUNTER — Telehealth: Payer: Self-pay | Admitting: *Deleted

## 2018-12-09 NOTE — Telephone Encounter (Signed)
Patient called reporting that the Tamoxifen is making her sweat worse than the Anastrozole she was on. She is asking that something be done for  It as she is waking up drenched in sweat and if she even goes outside her face is beet red and she is sweating. Please advise

## 2018-12-12 NOTE — Telephone Encounter (Signed)
Jamie Curry spoke to her

## 2018-12-16 ENCOUNTER — Telehealth: Payer: Self-pay | Admitting: *Deleted

## 2018-12-16 NOTE — Telephone Encounter (Signed)
Called pt and she states that the hot flashes are just too much and she can't take it anymore. She wants to go back on exemestane. I told her that Dr. Janese Banks said to stop the tamoxifen for 3 weeks and make sure her side effects got better and if it did then start the exemestane. If the sx do not get better being off tamoxifen to please call me back at the 3 week mark. She will let me know. She has exemestane at home and will restart the med after there third week if she is feeling better.

## 2018-12-16 NOTE — Telephone Encounter (Signed)
Patient called and requests Judeen Hammans call her back regarding her Tamoxifen and the side effects she is having, she states she has decided not to take it any longer and wants to go back on Aromasin 25 mg which she still has some from previous prescription Please return her call [phone]

## 2018-12-17 ENCOUNTER — Other Ambulatory Visit: Payer: Self-pay | Admitting: Oncology

## 2019-01-02 ENCOUNTER — Other Ambulatory Visit: Payer: Self-pay | Admitting: Surgery

## 2019-01-02 DIAGNOSIS — Z853 Personal history of malignant neoplasm of breast: Secondary | ICD-10-CM

## 2019-01-04 DIAGNOSIS — H16223 Keratoconjunctivitis sicca, not specified as Sjogren's, bilateral: Secondary | ICD-10-CM | POA: Diagnosis not present

## 2019-01-06 ENCOUNTER — Telehealth: Payer: Self-pay | Admitting: *Deleted

## 2019-01-06 NOTE — Telephone Encounter (Signed)
Patient called to report that she is doing much better off the Tamoxifen and plans to start Exemestane today

## 2019-02-09 ENCOUNTER — Ambulatory Visit
Admission: RE | Admit: 2019-02-09 | Discharge: 2019-02-09 | Disposition: A | Discharge status: Home / Self Care | Payer: PPO | Source: Ambulatory Visit | Attending: Surgery | Admitting: Surgery

## 2019-02-09 DIAGNOSIS — Z853 Personal history of malignant neoplasm of breast: Secondary | ICD-10-CM | POA: Diagnosis not present

## 2019-02-09 DIAGNOSIS — R928 Other abnormal and inconclusive findings on diagnostic imaging of breast: Secondary | ICD-10-CM | POA: Diagnosis not present

## 2019-02-15 DIAGNOSIS — Z853 Personal history of malignant neoplasm of breast: Secondary | ICD-10-CM | POA: Diagnosis not present

## 2019-02-21 DIAGNOSIS — H1132 Conjunctival hemorrhage, left eye: Secondary | ICD-10-CM | POA: Diagnosis not present

## 2019-05-03 ENCOUNTER — Encounter: Payer: Self-pay | Admitting: *Deleted

## 2019-05-09 ENCOUNTER — Other Ambulatory Visit: Payer: Self-pay | Admitting: Orthopaedic Surgery

## 2019-05-16 ENCOUNTER — Inpatient Hospital Stay: Payer: PPO | Attending: Oncology

## 2019-05-16 ENCOUNTER — Other Ambulatory Visit: Payer: Self-pay

## 2019-05-16 ENCOUNTER — Encounter: Payer: Self-pay | Admitting: Oncology

## 2019-05-16 ENCOUNTER — Inpatient Hospital Stay (HOSPITAL_BASED_OUTPATIENT_CLINIC_OR_DEPARTMENT_OTHER): Payer: PPO | Admitting: Oncology

## 2019-05-16 VITALS — BP 143/64 | HR 59 | Temp 98.9°F | Ht 64.0 in | Wt 185.0 lb

## 2019-05-16 DIAGNOSIS — R011 Cardiac murmur, unspecified: Secondary | ICD-10-CM | POA: Diagnosis not present

## 2019-05-16 DIAGNOSIS — C50412 Malignant neoplasm of upper-outer quadrant of left female breast: Secondary | ICD-10-CM

## 2019-05-16 DIAGNOSIS — Z08 Encounter for follow-up examination after completed treatment for malignant neoplasm: Secondary | ICD-10-CM

## 2019-05-16 DIAGNOSIS — Z170 Estrogen receptor positive status [ER+]: Secondary | ICD-10-CM | POA: Insufficient documentation

## 2019-05-16 DIAGNOSIS — Z79811 Long term (current) use of aromatase inhibitors: Secondary | ICD-10-CM | POA: Diagnosis not present

## 2019-05-16 DIAGNOSIS — Z853 Personal history of malignant neoplasm of breast: Secondary | ICD-10-CM | POA: Diagnosis not present

## 2019-05-16 DIAGNOSIS — I2510 Atherosclerotic heart disease of native coronary artery without angina pectoris: Secondary | ICD-10-CM | POA: Diagnosis not present

## 2019-05-16 DIAGNOSIS — F329 Major depressive disorder, single episode, unspecified: Secondary | ICD-10-CM | POA: Insufficient documentation

## 2019-05-16 DIAGNOSIS — Z9012 Acquired absence of left breast and nipple: Secondary | ICD-10-CM | POA: Diagnosis not present

## 2019-05-16 DIAGNOSIS — J45909 Unspecified asthma, uncomplicated: Secondary | ICD-10-CM | POA: Insufficient documentation

## 2019-05-16 DIAGNOSIS — R002 Palpitations: Secondary | ICD-10-CM | POA: Diagnosis not present

## 2019-05-16 DIAGNOSIS — E785 Hyperlipidemia, unspecified: Secondary | ICD-10-CM | POA: Insufficient documentation

## 2019-05-16 DIAGNOSIS — Z791 Long term (current) use of non-steroidal anti-inflammatories (NSAID): Secondary | ICD-10-CM | POA: Diagnosis not present

## 2019-05-16 DIAGNOSIS — I10 Essential (primary) hypertension: Secondary | ICD-10-CM | POA: Diagnosis not present

## 2019-05-16 DIAGNOSIS — F419 Anxiety disorder, unspecified: Secondary | ICD-10-CM | POA: Insufficient documentation

## 2019-05-16 DIAGNOSIS — Z79899 Other long term (current) drug therapy: Secondary | ICD-10-CM | POA: Diagnosis not present

## 2019-05-16 LAB — CBC WITH DIFFERENTIAL/PLATELET
Abs Immature Granulocytes: 0.01 10*3/uL (ref 0.00–0.07)
Basophils Absolute: 0.1 10*3/uL (ref 0.0–0.1)
Basophils Relative: 1 %
Eosinophils Absolute: 0.1 10*3/uL (ref 0.0–0.5)
Eosinophils Relative: 2 %
HCT: 38.8 % (ref 36.0–46.0)
Hemoglobin: 12.8 g/dL (ref 12.0–15.0)
Immature Granulocytes: 0 %
Lymphocytes Relative: 32 %
Lymphs Abs: 1.5 10*3/uL (ref 0.7–4.0)
MCH: 30.8 pg (ref 26.0–34.0)
MCHC: 33 g/dL (ref 30.0–36.0)
MCV: 93.3 fL (ref 80.0–100.0)
Monocytes Absolute: 0.5 10*3/uL (ref 0.1–1.0)
Monocytes Relative: 11 %
Neutro Abs: 2.5 10*3/uL (ref 1.7–7.7)
Neutrophils Relative %: 54 %
Platelets: 219 10*3/uL (ref 150–400)
RBC: 4.16 MIL/uL (ref 3.87–5.11)
RDW: 12.6 % (ref 11.5–15.5)
WBC: 4.7 10*3/uL (ref 4.0–10.5)
nRBC: 0 % (ref 0.0–0.2)

## 2019-05-16 LAB — COMPREHENSIVE METABOLIC PANEL
ALT: 28 U/L (ref 0–44)
AST: 26 U/L (ref 15–41)
Albumin: 4.5 g/dL (ref 3.5–5.0)
Alkaline Phosphatase: 99 U/L (ref 38–126)
Anion gap: 12 (ref 5–15)
BUN: 23 mg/dL (ref 8–23)
CO2: 25 mmol/L (ref 22–32)
Calcium: 9.6 mg/dL (ref 8.9–10.3)
Chloride: 99 mmol/L (ref 98–111)
Creatinine, Ser: 0.75 mg/dL (ref 0.44–1.00)
GFR calc Af Amer: >60 mL/min (ref >60)
GFR calc non Af Amer: >60 mL/min (ref >60)
Glucose, Bld: 101 mg/dL — ABNORMAL HIGH (ref 70–99)
Potassium: 3.7 mmol/L (ref 3.5–5.1)
Sodium: 136 mmol/L (ref 135–145)
Total Bilirubin: 0.7 mg/dL (ref 0.3–1.2)
Total Protein: 7.9 g/dL (ref 6.5–8.1)

## 2019-05-16 NOTE — Progress Notes (Signed)
Patient stated that she had been doing well with no complaints. Patient's last mammogram was on 02/09/2019 and it was normal. Patient's last Bone density was last on 05/09/2018.

## 2019-05-17 DIAGNOSIS — H6123 Impacted cerumen, bilateral: Secondary | ICD-10-CM | POA: Diagnosis not present

## 2019-05-17 DIAGNOSIS — H902 Conductive hearing loss, unspecified: Secondary | ICD-10-CM | POA: Diagnosis not present

## 2019-05-19 NOTE — Progress Notes (Signed)
Hematology/Oncology Consult note [facility]  Telephone:([phone] [phone]  Patient Care Team: Marton Redwood, MD as PCP - General (Internal Medicine)   Name of the patient: Jamie Curry  [id number]  November 22, 1945   Date of visit: 05/19/19  Diagnosis- Stage IIb pT2N1a(sn)cM0 invasive lobular carcinoma of the left breast ER 10% positive, PR 10% positive, HER-2/neu negative status post lumpectomy/SLND  Chief complaint/ Reason for visit-routine follow-up of breast cancer  Heme/Onc history:  Oncology History  Breast cancer of upper-outer quadrant of left female breast ([place])  02/15/2008 Imaging   Small  probable mildly complicated cysts within the upper outer quadrant right breast at the 11 o'clock position in the region of questionable nodularity noted on mammography.  Recommend follow-up right breast diagnostic mammogram and ultrasound in 6 months.      08/13/2008 Imaging   Ultrasound is performed, showing stable small minimal complicated cysts at the right breast 11 o'clock position 4 cm from the nipple, not changed compared prior exam   09/23/2010 Imaging   1.3 cm simple cyst located within the left breast at the 12 o'clock position corresponding to the mammographic finding.  No findings worrisome for malignancy.  Recommend annual screening mammography   09/23/2010 Mammogram   1.3 cm simple cyst located within the left breast at the 12 o'clock position corresponding to the mammographic finding.  No findings worrisome for malignancy.    02/05/2015 Imaging   A 2.5 x 3.5 cm oval circumscribed mass within the upper retroareolar left breast is identified. No suspicious calcifications or distortion identified.  On physical exam, a firm palpable mobile mass is identified at the 11:30 position of the left breast 4 cm from the nipple. Targeted ultrasound is performed, showing a 2.8 x 1.4 x 3.6 cm simple cyst at the 11:30 position of the left breast,  corresponding to the screening study finding.   02/07/2016 Imaging   Targeted ultrasound of the left breast was performed demonstrating an irregular shadowing hypoechoic mass at 1 o'clock 5 cm from nipple measuring approximately 2.3 x 1 x 1.6 cm. This corresponds well with mammography findings. No lymphadenopathy seen in the left axilla.   02/07/2016 Mammogram   Note that the patient has a stable oval circumscribed mass in the retroareolar left breast previously characterized as a cyst. Although this cyst appears stable when compared to 2016 it is overall increasing in size and therefore given the patient's highly suspicious mass in the upper-outer left breast aspiration of the retroareolar left breast cyst is warranted.   02/07/2016 Procedure   She had US guided biopsy of breast mass   02/07/2016 Pathology Results   Accession: [id number]: Biopsy confirmed invasive breast cancer, ER 10% positive, PR 10% positive and Her2/neu negative, Ki 67 15%  Additional molecular study with MammaPrint result came back low risk (10% risk of recurrence in 10 years)   02/27/2016 Imaging   MR breast showed area of known malignancy in the upper-outer quadrant of the left breast, measuring maximum of 4.0 cm (anterior-posterior) an associated clip artifact. No findings to indicate multicentric, multifocal, or contralateral malignancy. No MRI evidence for adenopathy   03/03/2016 Surgery   She underwent left partial mastectomy and sentinel lymph node biopsy   03/03/2016 Pathology Results   CASE: [id number] Invasive lobular carcinoma 2.3 cm, Histologic Grade (Nottingham Histologic Score) Glandular (Acinar)/Tubular Differentiation: Score 3 Nuclear Pleomorphism: Score 2 Mitotic Rate: Score 1 Total score: 6 Overall Grade: Grade 2 Tumor Size: 23 mm. Size of lymph  node invasion is 2.5 mm    Patient was switched briefly to tamoxifen but preferd to go back to aromasin  Interval history- reports that her hot  flashes are tolerable.  Denies other complaints at this time  ECOG PS- 1 Pain scale- 0   Review of systems- Review of Systems  Constitutional: Negative for chills, fever, malaise/fatigue and weight loss.  HENT: Negative for congestion, ear discharge and nosebleeds.   Eyes: Negative for blurred vision.  Respiratory: Negative for cough, hemoptysis, sputum production, shortness of breath and wheezing.   Cardiovascular: Negative for chest pain, palpitations, orthopnea and claudication.  Gastrointestinal: Negative for abdominal pain, blood in stool, constipation, diarrhea, heartburn, melena, nausea and vomiting.  Genitourinary: Negative for dysuria, flank pain, frequency, hematuria and urgency.  Musculoskeletal: Negative for back pain, joint pain and myalgias.  Skin: Negative for rash.  Neurological: Negative for dizziness, tingling, focal weakness, seizures, weakness and headaches.  Endo/Heme/Allergies: Does not bruise/bleed easily.       Hot flashes  Psychiatric/Behavioral: Negative for depression and suicidal ideas. The patient does not have insomnia.       Allergies  Allergen Reactions  . Penicillins Anaphylaxis    REACTION: severe anaphylaxis Has patient had a PCN reaction causing immediate rash, facial/tongue/throat swelling, SOB or lightheadedness with hypotension: Yes Has patient had a PCN reaction causing severe rash involving mucus membranes or skin necrosis: No Has patient had a PCN reaction that required hospitalization No Has patient had a PCN reaction occurring within the last 10 years: No If all of the above answers are "NO", then may proceed with Cephalosporin use.   Marland Kitchen Zithromax [Azithromycin] Rash    z-pack = rash  . Anastrozole Other (See Comments)    Extreme sweating  . Cefuroxime Axetil Other (See Comments)    unknown  . Cefuroxime Axetil Other (See Comments)  . Codeine Other (See Comments)    unknown  . Erythromycin Other (See Comments)    unknown  .  Nitrofurantoin Other (See Comments)    unknown  . Ofloxacin Other (See Comments)    unknown  . Propoxyphene Other (See Comments)  . Propoxyphene N-Acetaminophen Other (See Comments)    unknown  . Sulfonamide Derivatives Other (See Comments)    unknown  . Tetracycline Other (See Comments)    unknown  . Sulfa Antibiotics Other (See Comments) and Rash     Past Medical History:  Diagnosis Date  . Anxiety   . Asthma   . Breast cancer ([place]) 02/07/2016   left breast invasive lobular carcinoma  . CAD (coronary artery disease)    mild CAD by cath in 2001  . Depression   . Diverticulosis   . Heart murmur   . HTN (hypertension)   . Hyperlipidemia   . Palpitations   . Personal history of radiation therapy 2018   left breast ca  . Syncope    Pre-syncope     Past Surgical History:  Procedure Laterality Date  . ABDOMINAL HYSTERECTOMY    . BREAST BIOPSY Left 02/07/2016   grade II invasive and in situ mammary carcinoma  . BREAST CYST ASPIRATION Left 02/13/2016   cyst  . BREAST LUMPECTOMY Left 03/03/2016   invasive lobular carcinoma, clear margins, METASTATIC LOBULAR CARCINOMA, 2.5 MM, IN ONE LYMPH NODE (1/1).   . CHOLECYSTECTOMY    . KNEE ARTHROSCOPY Right   . PARTIAL MASTECTOMY WITH NEEDLE LOCALIZATION Left 03/03/2016   Procedure: PARTIAL MASTECTOMY WITH NEEDLE LOCALIZATION;  Surgeon: Leonie Green, MD;  Location:  [facility] ORS;  Service: General;  Laterality: Left;  . SENTINEL NODE BIOPSY Left 03/03/2016   Procedure: SENTINEL NODE BIOPSY;  Surgeon: Leonie Green, MD;  Location: [facility] ORS;  Service: General;  Laterality: Left;  . TUBAL LIGATION      Social History   Socioeconomic History  . Marital status: Married    Spouse name: Not on file  . Number of children: Not on file  . Years of education: Not on file  . Highest education level: Not on file  Occupational History  . Not on file  Tobacco Use  . Smoking status: Never Smoker  . Smokeless tobacco: Never  Used  Substance and Sexual Activity  . Alcohol use: No    Alcohol/week: 0.0 standard drinks  . Drug use: No  . Sexual activity: Not on file  Other Topics Concern  . Not on file  Social History Narrative  . Not on file   Social Determinants of Health   Financial Resource Strain:   . Difficulty of Paying Living Expenses: Not on file  Food Insecurity:   . Worried About Charity fundraiser in the Last Year: Not on file  . Ran Out of Food in the Last Year: Not on file  Transportation Needs:   . Lack of Transportation (Medical): Not on file  . Lack of Transportation (Non-Medical): Not on file  Physical Activity:   . Days of Exercise per Week: Not on file  . Minutes of Exercise per Session: Not on file  Stress:   . Feeling of Stress : Not on file  Social Connections:   . Frequency of Communication with Friends and Family: Not on file  . Frequency of Social Gatherings with Friends and Family: Not on file  . Attends Religious Services: Not on file  . Active Member of Clubs or Organizations: Not on file  . Attends Archivist Meetings: Not on file  . Marital Status: Not on file  Intimate Partner Violence:   . Fear of Current or Ex-Partner: Not on file  . Emotionally Abused: Not on file  . Physically Abused: Not on file  . Sexually Abused: Not on file    Family History  Problem Relation Age of Onset  . Cancer Father        oral cancer  . Cancer Sister        colon cancer and lymphoma     Current Outpatient Medications:  .  ALPRAZolam (XANAX) 0.5 MG tablet, Take 0.5 mg by mouth at bedtime as needed for anxiety., Disp: , Rfl:  .  amLODipine (NORVASC) 5 MG tablet, Take 5 mg by mouth daily.  , Disp: , Rfl:  .  Calcium Carb-Cholecalciferol (CALCIUM 500/D) 500-400 MG-UNIT CHEW, Chew 1,200 Units by mouth 2 (two) times daily., Disp: , Rfl:  .  citalopram (CELEXA) 20 MG tablet, Take 20 mg by mouth daily., Disp: , Rfl:  .  diclofenac (VOLTAREN) 50 MG EC tablet, TAKE 1 TABLET  BY MOUTH ONCE A DAY, Disp: 30 tablet, Rfl: 3 .  diclofenac sodium (VOLTAREN) 1 % GEL, Apply 2 g topically 4 (four) times daily., Disp: 1 Tube, Rfl: 1 .  exemestane (AROMASIN) 25 MG tablet, TAKE 1 TABLET BY MOUTH ONCE A DAY AFTER BREAKFAST., Disp: 90 tablet, Rfl: 1 .  fluticasone (FLONASE) 50 MCG/ACT nasal spray, Place 2 sprays into both nostrils daily., Disp: , Rfl:  .  hydrochlorothiazide (HYDRODIURIL) 25 MG tablet, Take 25 mg by mouth daily., Disp: , Rfl:  .  montelukast (SINGULAIR) 10 MG tablet, Take 10 mg by mouth at bedtime., Disp: , Rfl:  .  acetaminophen (TYLENOL) 500 MG tablet, Take 1,000 mg by mouth every 6 (six) hours as needed for mild pain., Disp: , Rfl:  .  rosuvastatin (CRESTOR) 10 MG tablet, Take 1 tablet by mouth daily., Disp: , Rfl:   Physical exam:  Vitals:   05/16/19 1433  BP: (!) 143/64  Pulse: (!) 59  Temp: 98.9 F (37.2 C)  TempSrc: Tympanic  Weight: 185 lb (83.9 kg)  Height: _0  (1.626 m)   Physical Exam Constitutional:      General: She is not in acute distress. HENT:     Head: Normocephalic and atraumatic.  Eyes:     Pupils: Pupils are equal, round, and reactive to light.  Cardiovascular:     Rate and Rhythm: Normal rate and regular rhythm.     Heart sounds: Normal heart sounds.  Pulmonary:     Effort: Pulmonary effort is normal.     Breath sounds: Normal breath sounds.  Abdominal:     General: Bowel sounds are normal.     Palpations: Abdomen is soft.  Musculoskeletal:     Cervical back: Normal range of motion.  Skin:    General: Skin is warm and dry.  Neurological:     Mental Status: She is alert and oriented to person, place, and time.    Breast exam was performed in seated and lying down position. Patient is status post left lumpectomy with a well-healed surgical scar. No evidence of any palpable masses. No evidence of axillary adenopathy. No evidence of any palpable masses or lumps in the right breast. No evidence of right axillary  adenopathy   CMP Latest Ref Rng & Units 05/16/2019  Glucose 70 - 99 mg/dL 101(H)  BUN 8 - 23 mg/dL 23  Creatinine 0.44 - 1.00 mg/dL 0.75  Sodium 135 - 145 mmol/L 136  Potassium 3.5 - 5.1 mmol/L 3.7  Chloride 98 - 111 mmol/L 99  CO2 22 - 32 mmol/L 25  Calcium 8.9 - 10.3 mg/dL 9.6  Total Protein 6.5 - 8.1 g/dL 7.9  Total Bilirubin 0.3 - 1.2 mg/dL 0.7  Alkaline Phos 38 - 126 U/L 99  AST 15 - 41 U/L 26  ALT 0 - 44 U/L 28   CBC Latest Ref Rng & Units 05/16/2019  WBC 4.0 - 10.5 K/uL 4.7  Hemoglobin 12.0 - 15.0 g/dL 12.8  Hematocrit 36.0 - 46.0 % 38.8  Platelets 150 - 400 K/uL 219     Assessment and plan- Patient is a 74 y.o. female a h/o Stage IIb pT2N1a(sn)cM0 invasive lobular carcinoma of the left breast ER 10% positive, PR 10% positive, HER-2/neu negative status post lumpectomy/SLND.  Patient is currently on exemestane and is here for routine follow-up  Clinically patient is doing well and no concerning signs and symptoms of recurrence based on today's exam.  She is tolerating Exemestane) any significant side effects (self-limited hot flashes which she will continue to monitor.  Patient will need to take this until 2022.  She will be due for repeat mammogram in October 2021 which will be coordinated by Dr. Lysle Pearl.  Her mammogram from October 2020 was unremarkable.  I will see her back in 6 months   Visit Diagnosis 1. Encounter for follow-up surveillance of breast cancer   2. Use of exemestane (Aromasin)      Dr. Randa Evens, MD, MPH [facility] at [facility] [phone] 05/19/2019 8:34  AM

## 2019-06-02 DIAGNOSIS — B029 Zoster without complications: Secondary | ICD-10-CM | POA: Diagnosis not present

## 2019-06-02 DIAGNOSIS — R21 Rash and other nonspecific skin eruption: Secondary | ICD-10-CM | POA: Diagnosis not present

## 2019-06-12 ENCOUNTER — Telehealth: Payer: Self-pay | Admitting: *Deleted

## 2019-06-12 ENCOUNTER — Other Ambulatory Visit: Payer: Self-pay | Admitting: Nurse Practitioner

## 2019-06-12 MED ORDER — NYSTATIN 100000 UNIT/GM EX POWD: 1.0000 "application " | Freq: Three times a day (TID) | CUTANEOUS | 0 refills | Status: DC

## 2019-06-12 NOTE — Telephone Encounter (Signed)
Patient called reporting that at her appointment on 05/16/19 that it was mentioned that a prescription could be ordered for her to use under her breast for the redness and sweating. She reports that the area is red and rashy looking and is asking that medicine be sent to pharmacy for this. She did say that she usewd a 1% cream that Dr Baruch Gouty had ordered for her while getting radiation therapy and it helped some. Please advise

## 2019-06-12 NOTE — Progress Notes (Signed)
Spoke to patient. Sounds like a yeast infection under her breast from excessive perspiration. She has been using SSD cream that she had on hand from Dr. Baruch Gouty. Advised her that symptoms likely secondary to yeast and I would sent her prescription for nystatin powder to apply topically. Stop SSD cream. Hygiene and anti-perspiration measures discussed. Also advised her to notify clinic if symptoms do not improve or worsen.

## 2019-06-12 NOTE — Telephone Encounter (Signed)
Spoke to patient. See orders note. She was using SSD cream from Dr. Baruch Gouty but symptoms more consistent with yeast due to excessive perspiration. Prescription for nystatin sent to pharmacy.

## 2019-06-12 NOTE — Telephone Encounter (Signed)
Can you please call her and see if she was given nystatin cream for this by Dr. Baruch Gouty? We can send her a prescription for nystatin powder and see if it helps.

## 2019-06-22 ENCOUNTER — Other Ambulatory Visit: Payer: Self-pay | Admitting: Oncology

## 2019-08-03 ENCOUNTER — Telehealth: Payer: Self-pay | Admitting: Oncology

## 2019-08-03 NOTE — Telephone Encounter (Signed)
MD will not be in the office on 11-14-19. Writer phoned patient on this date and rescheduled appt.

## 2019-09-01 ENCOUNTER — Ambulatory Visit: Payer: PPO | Admitting: Radiation Oncology

## 2019-09-08 ENCOUNTER — Ambulatory Visit
Admission: RE | Admit: 2019-09-08 | Discharge: 2019-09-08 | Disposition: A | Discharge status: Home / Self Care | Payer: PPO | Source: Ambulatory Visit | Attending: Radiation Oncology | Admitting: Radiation Oncology

## 2019-09-08 ENCOUNTER — Other Ambulatory Visit: Payer: Self-pay

## 2019-09-08 ENCOUNTER — Encounter: Payer: Self-pay | Admitting: Radiation Oncology

## 2019-09-08 VITALS — BP 140/64 | HR 56 | Temp 96.5°F | Resp 16 | Wt 185.5 lb

## 2019-09-08 DIAGNOSIS — Z170 Estrogen receptor positive status [ER+]: Secondary | ICD-10-CM | POA: Diagnosis not present

## 2019-09-08 DIAGNOSIS — Z923 Personal history of irradiation: Secondary | ICD-10-CM | POA: Diagnosis not present

## 2019-09-08 DIAGNOSIS — Z79811 Long term (current) use of aromatase inhibitors: Secondary | ICD-10-CM | POA: Diagnosis not present

## 2019-09-08 DIAGNOSIS — C50412 Malignant neoplasm of upper-outer quadrant of left female breast: Secondary | ICD-10-CM | POA: Insufficient documentation

## 2019-09-08 NOTE — Progress Notes (Signed)
Radiation Oncology Follow up Note  Name: Jamie Curry   Date:   09/08/2019 MRN:  [id number] DOB: 1945-09-27    This 74 y.o. female presents to the clinic today for 3-year follow-up status post whole breast radiation to her left breast for stage IIb (T2 N1 M0) invasive lobular carcinoma weakly ER/PR positive HER-2/neu negative.  REFERRING PROVIDER: Marton Redwood, MD  HPI: Patient is a 74 year old female now out 3 years having completed whole breast radiation to her left breast for invasive lobular carcinoma.  Seen today in routine follow-up she is doing well.  She specifically denies breast tenderness cough or bone pain.  She had.  Mammograms back in October which I have reviewed were BI-RADS 2 benign.  She is currently on Aromasin tolerating that well without side effect.  COMPLICATIONS OF TREATMENT: none  FOLLOW UP COMPLIANCE: keeps appointments   PHYSICAL EXAM:  BP 140/64 (BP Location: Right Arm, Patient Position: Sitting, Cuff Size: Large)   Pulse (!) 56   Temp (!) 96.5 F (35.8 C) (Tympanic)   Resp 16   Wt 185 lb 8 oz (84.1 kg)   BMI 31.84 kg/m  Lungs are clear to A&P cardiac examination essentially unremarkable with regular rate and rhythm. No dominant mass or nodularity is noted in either breast in 2 positions examined. Incision is well-healed. No axillary or supraclavicular adenopathy is appreciated. Cosmetic result is excellent.  Well-developed well-nourished patient in NAD. HEENT reveals PERLA, EOMI, discs not visualized.  Oral cavity is clear. No oral mucosal lesions are identified. Neck is clear without evidence of cervical or supraclavicular adenopathy. Lungs are clear to A&P. Cardiac examination is essentially unremarkable with regular rate and rhythm without murmur rub or thrill. Abdomen is benign with no organomegaly or masses noted. Motor sensory and DTR levels are equal and symmetric in the upper and lower extremities. Cranial nerves II through XII are grossly intact.  Proprioception is intact. No peripheral adenopathy or edema is identified. No motor or sensory levels are noted. Crude visual fields are within normal range.  RADIOLOGY RESULTS: Mammograms reviewed compatible with above-stated findings  PLAN: Present time patient is doing well with no evidence of disease 3 years out.  She continues on Aromasin without side effect.  She is already scheduled for follow-up mammograms in October.  Patient knows to call with any concerns.  I have asked to see her back in 1 year for follow-up.  I would like to take this opportunity to thank you for allowing me to participate in the care of your patient.Noreene Filbert, MD

## 2019-09-15 ENCOUNTER — Other Ambulatory Visit: Payer: Self-pay | Admitting: Orthopaedic Surgery

## 2019-09-18 DIAGNOSIS — I10 Essential (primary) hypertension: Secondary | ICD-10-CM | POA: Diagnosis not present

## 2019-09-18 DIAGNOSIS — R82998 Other abnormal findings in urine: Secondary | ICD-10-CM | POA: Diagnosis not present

## 2019-09-18 DIAGNOSIS — M859 Disorder of bone density and structure, unspecified: Secondary | ICD-10-CM | POA: Diagnosis not present

## 2019-09-18 DIAGNOSIS — R7301 Impaired fasting glucose: Secondary | ICD-10-CM | POA: Diagnosis not present

## 2019-09-18 DIAGNOSIS — F3341 Major depressive disorder, recurrent, in partial remission: Secondary | ICD-10-CM | POA: Diagnosis not present

## 2019-09-18 DIAGNOSIS — I2510 Atherosclerotic heart disease of native coronary artery without angina pectoris: Secondary | ICD-10-CM | POA: Diagnosis not present

## 2019-09-18 DIAGNOSIS — D692 Other nonthrombocytopenic purpura: Secondary | ICD-10-CM | POA: Diagnosis not present

## 2019-09-18 DIAGNOSIS — I6521 Occlusion and stenosis of right carotid artery: Secondary | ICD-10-CM | POA: Diagnosis not present

## 2019-09-18 DIAGNOSIS — Z0000 Encounter for general adult medical examination without abnormal findings: Secondary | ICD-10-CM | POA: Diagnosis not present

## 2019-09-18 DIAGNOSIS — J45909 Unspecified asthma, uncomplicated: Secondary | ICD-10-CM | POA: Diagnosis not present

## 2019-09-18 DIAGNOSIS — C50919 Malignant neoplasm of unspecified site of unspecified female breast: Secondary | ICD-10-CM | POA: Diagnosis not present

## 2019-09-18 DIAGNOSIS — E782 Mixed hyperlipidemia: Secondary | ICD-10-CM | POA: Diagnosis not present

## 2019-09-18 DIAGNOSIS — E669 Obesity, unspecified: Secondary | ICD-10-CM | POA: Diagnosis not present

## 2019-11-14 ENCOUNTER — Ambulatory Visit: Payer: PPO | Admitting: Oncology

## 2019-11-15 DIAGNOSIS — H902 Conductive hearing loss, unspecified: Secondary | ICD-10-CM | POA: Diagnosis not present

## 2019-11-15 DIAGNOSIS — H6123 Impacted cerumen, bilateral: Secondary | ICD-10-CM | POA: Diagnosis not present

## 2019-12-05 ENCOUNTER — Other Ambulatory Visit: Payer: Self-pay

## 2019-12-05 ENCOUNTER — Inpatient Hospital Stay: Payer: PPO | Attending: Oncology | Admitting: Oncology

## 2019-12-05 ENCOUNTER — Encounter: Payer: Self-pay | Admitting: Oncology

## 2019-12-05 VITALS — BP 133/56 | HR 58 | Resp 16 | Wt 185.0 lb

## 2019-12-05 DIAGNOSIS — Z853 Personal history of malignant neoplasm of breast: Secondary | ICD-10-CM | POA: Diagnosis not present

## 2019-12-05 DIAGNOSIS — Z807 Family history of other malignant neoplasms of lymphoid, hematopoietic and related tissues: Secondary | ICD-10-CM | POA: Diagnosis not present

## 2019-12-05 DIAGNOSIS — Z800 Family history of malignant neoplasm of digestive organs: Secondary | ICD-10-CM | POA: Diagnosis not present

## 2019-12-05 DIAGNOSIS — Z808 Family history of malignant neoplasm of other organs or systems: Secondary | ICD-10-CM | POA: Diagnosis not present

## 2019-12-05 DIAGNOSIS — M25561 Pain in right knee: Secondary | ICD-10-CM | POA: Insufficient documentation

## 2019-12-05 DIAGNOSIS — C50412 Malignant neoplasm of upper-outer quadrant of left female breast: Secondary | ICD-10-CM | POA: Insufficient documentation

## 2019-12-05 DIAGNOSIS — Z79811 Long term (current) use of aromatase inhibitors: Secondary | ICD-10-CM

## 2019-12-05 DIAGNOSIS — M25562 Pain in left knee: Secondary | ICD-10-CM | POA: Insufficient documentation

## 2019-12-05 DIAGNOSIS — Z9012 Acquired absence of left breast and nipple: Secondary | ICD-10-CM | POA: Diagnosis not present

## 2019-12-05 DIAGNOSIS — Z08 Encounter for follow-up examination after completed treatment for malignant neoplasm: Secondary | ICD-10-CM

## 2019-12-05 DIAGNOSIS — Z90710 Acquired absence of both cervix and uterus: Secondary | ICD-10-CM | POA: Insufficient documentation

## 2019-12-05 DIAGNOSIS — N951 Menopausal and female climacteric states: Secondary | ICD-10-CM | POA: Diagnosis not present

## 2019-12-05 DIAGNOSIS — Z170 Estrogen receptor positive status [ER+]: Secondary | ICD-10-CM | POA: Diagnosis not present

## 2019-12-05 NOTE — Progress Notes (Signed)
Hematology/Oncology Consult note [facility]  Telephone:([phone] [phone]  Patient Care Team: Marton Redwood, MD as PCP - General (Internal Medicine)   Name of the patient: Jamie Curry  [id number]  [birth year]   Date of visit: 12/05/19  Diagnosis- Stage IIb pT2N1a(sn)cM0 invasive lobular carcinoma of the left breast ER 10% positive, PR 10% positive, HER-2/neu negative status post lumpectomy/SLND  Chief complaint/ Reason for visit-routine follow-up of breast cancer  Heme/Onc history:  Oncology History  Breast cancer of upper-outer quadrant of left female breast ([place])  02/15/2008 Imaging   Small  probable mildly complicated cysts within the upper outer quadrant right breast at the 11 o'clock position in the region of questionable nodularity noted on mammography.  Recommend follow-up right breast diagnostic mammogram and ultrasound in 6 months.      08/13/2008 Imaging   Ultrasound is performed, showing stable small minimal complicated cysts at the right breast 11 o'clock position 4 cm from the nipple, not changed compared prior exam   09/23/2010 Imaging   1.3 cm simple cyst located within the left breast at the 12 o'clock position corresponding to the mammographic finding.  No findings worrisome for malignancy.  Recommend annual screening mammography   09/23/2010 Mammogram   1.3 cm simple cyst located within the left breast at the 12 o'clock position corresponding to the mammographic finding.  No findings worrisome for malignancy.    02/05/2015 Imaging   A 2.5 x 3.5 cm oval circumscribed mass within the upper retroareolar left breast is identified. No suspicious calcifications or distortion identified.  On physical exam, a firm palpable mobile mass is identified at the 11:30 position of the left breast 4 cm from the nipple. Targeted ultrasound is performed, showing a 2.8 x 1.4 x 3.6 cm simple cyst at the 11:30 position of the left breast,  corresponding to the screening study finding.   02/07/2016 Imaging   Targeted ultrasound of the left breast was performed demonstrating an irregular shadowing hypoechoic mass at 1 o'clock 5 cm from nipple measuring approximately 2.3 x 1 x 1.6 cm. This corresponds well with mammography findings. No lymphadenopathy seen in the left axilla.   02/07/2016 Mammogram   Note that the patient has a stable oval circumscribed mass in the retroareolar left breast previously characterized as a cyst. Although this cyst appears stable when compared to 2016 it is overall increasing in size and therefore given the patient's highly suspicious mass in the upper-outer left breast aspiration of the retroareolar left breast cyst is warranted.   02/07/2016 Procedure   She had US guided biopsy of breast mass   02/07/2016 Pathology Results   Accession: [id number]: Biopsy confirmed invasive breast cancer, ER 10% positive, PR 10% positive and Her2/neu negative, Ki 67 15%  Additional molecular study with MammaPrint result came back low risk (10% risk of recurrence in 10 years)   02/27/2016 Imaging   MR breast showed area of known malignancy in the upper-outer quadrant of the left breast, measuring maximum of 4.0 cm (anterior-posterior) an associated clip artifact. No findings to indicate multicentric, multifocal, or contralateral malignancy. No MRI evidence for adenopathy   03/03/2016 Surgery   She underwent left partial mastectomy and sentinel lymph node biopsy   03/03/2016 Pathology Results   CASE: [id number] Invasive lobular carcinoma 2.3 cm, Histologic Grade (Nottingham Histologic Score) Glandular (Acinar)/Tubular Differentiation: Score 3 Nuclear Pleomorphism: Score 2 Mitotic Rate: Score 1 Total score: 6 Overall Grade: Grade 2 Tumor Size: 23 mm. Size of lymph  node invasion is 2.5 mm    Patient was switched briefly to tamoxifen but preferd to go back to aromasin   Interval history-patient is tolerating  Aromasin well except for occasional hot flashes which are self-limited.  She is also taking her calcium and vitamin D.  Reports having bilateral knee pain for which she sees orthopedics.  ECOG PS- 1 Pain scale- 0   Review of systems- Review of Systems  Constitutional: Negative for chills, fever, malaise/fatigue and weight loss.  HENT: Negative for congestion, ear discharge and nosebleeds.   Eyes: Negative for blurred vision.  Respiratory: Negative for cough, hemoptysis, sputum production, shortness of breath and wheezing.   Cardiovascular: Negative for chest pain, palpitations, orthopnea and claudication.  Gastrointestinal: Negative for abdominal pain, blood in stool, constipation, diarrhea, heartburn, melena, nausea and vomiting.  Genitourinary: Negative for dysuria, flank pain, frequency, hematuria and urgency.  Musculoskeletal: Negative for back pain, joint pain and myalgias.  Skin: Negative for rash.  Neurological: Negative for dizziness, tingling, focal weakness, seizures, weakness and headaches.  Endo/Heme/Allergies: Does not bruise/bleed easily.  Psychiatric/Behavioral: Negative for depression and suicidal ideas. The patient does not have insomnia.      Allergies  Allergen Reactions  . Penicillins Anaphylaxis    REACTION: severe anaphylaxis Has patient had a PCN reaction causing immediate rash, facial/tongue/throat swelling, SOB or lightheadedness with hypotension: Yes Has patient had a PCN reaction causing severe rash involving mucus membranes or skin necrosis: No Has patient had a PCN reaction that required hospitalization No Has patient had a PCN reaction occurring within the last 10 years: No If all of the above answers are "NO", then may proceed with Cephalosporin use.   Marland Kitchen Zithromax [Azithromycin] Rash    z-pack = rash  . Anastrozole Other (See Comments)    Extreme sweating  . Cefuroxime Axetil Other (See Comments)    unknown  . Cefuroxime Axetil Other (See Comments)    . Codeine Other (See Comments)    unknown  . Erythromycin Other (See Comments)    unknown  . Nitrofurantoin Other (See Comments)    unknown  . Ofloxacin Other (See Comments)    unknown  . Propoxyphene Other (See Comments)  . Propoxyphene N-Acetaminophen Other (See Comments)    unknown  . Sulfonamide Derivatives Other (See Comments)    unknown  . Tetracycline Other (See Comments)    unknown  . Sulfa Antibiotics Other (See Comments) and Rash     Past Medical History:  Diagnosis Date  . Anxiety   . Asthma   . Breast cancer ([place]) 02/07/2016   left breast invasive lobular carcinoma  . CAD (coronary artery disease)    mild CAD by cath in 2001  . Depression   . Diverticulosis   . Heart murmur   . HTN (hypertension)   . Hyperlipidemia   . Palpitations   . Personal history of radiation therapy 2018   left breast ca  . Syncope    Pre-syncope     Past Surgical History:  Procedure Laterality Date  . ABDOMINAL HYSTERECTOMY    . BREAST BIOPSY Left 02/07/2016   grade II invasive and in situ mammary carcinoma  . BREAST CYST ASPIRATION Left 02/13/2016   cyst  . BREAST LUMPECTOMY Left 03/03/2016   invasive lobular carcinoma, clear margins, METASTATIC LOBULAR CARCINOMA, 2.5 MM, IN ONE LYMPH NODE (1/1).   . CHOLECYSTECTOMY    . KNEE ARTHROSCOPY Right   . PARTIAL MASTECTOMY WITH NEEDLE LOCALIZATION Left 03/03/2016   Procedure: PARTIAL  MASTECTOMY WITH NEEDLE LOCALIZATION;  Surgeon: Leonie Green, MD;  Location: [facility] ORS;  Service: General;  Laterality: Left;  . SENTINEL NODE BIOPSY Left 03/03/2016   Procedure: SENTINEL NODE BIOPSY;  Surgeon: Leonie Green, MD;  Location: [facility] ORS;  Service: General;  Laterality: Left;  . TUBAL LIGATION      Social History   Socioeconomic History  . Marital status: Married    Spouse name: Not on file  . Number of children: Not on file  . Years of education: Not on file  . Highest education level: Not on file  Occupational  History  . Not on file  Tobacco Use  . Smoking status: Never Smoker  . Smokeless tobacco: Never Used  Substance and Sexual Activity  . Alcohol use: No    Alcohol/week: 0.0 standard drinks  . Drug use: No  . Sexual activity: Not on file  Other Topics Concern  . Not on file  Social History Narrative  . Not on file   Social Determinants of Health   Financial Resource Strain:   . Difficulty of Paying Living Expenses: Not on file  Food Insecurity:   . Worried About Charity fundraiser in the Last Year: Not on file  . Ran Out of Food in the Last Year: Not on file  Transportation Needs:   . Lack of Transportation (Medical): Not on file  . Lack of Transportation (Non-Medical): Not on file  Physical Activity:   . Days of Exercise per Week: Not on file  . Minutes of Exercise per Session: Not on file  Stress:   . Feeling of Stress : Not on file  Social Connections:   . Frequency of Communication with Friends and Family: Not on file  . Frequency of Social Gatherings with Friends and Family: Not on file  . Attends Religious Services: Not on file  . Active Member of Clubs or Organizations: Not on file  . Attends Archivist Meetings: Not on file  . Marital Status: Not on file  Intimate Partner Violence:   . Fear of Current or Ex-Partner: Not on file  . Emotionally Abused: Not on file  . Physically Abused: Not on file  . Sexually Abused: Not on file    Family History  Problem Relation Age of Onset  . Cancer Father        oral cancer  . Cancer Sister        colon cancer and lymphoma     Current Outpatient Medications:  .  acetaminophen (TYLENOL) 500 MG tablet, Take 1,000 mg by mouth every 6 (six) hours as needed for mild pain., Disp: , Rfl:  .  ALPRAZolam (XANAX) 0.5 MG tablet, Take 0.5 mg by mouth at bedtime as needed for anxiety., Disp: , Rfl:  .  amLODipine (NORVASC) 5 MG tablet, Take 5 mg by mouth daily.  , Disp: , Rfl:  .  Calcium Carb-Cholecalciferol (CALCIUM  500/D) 500-400 MG-UNIT CHEW, Chew 1,200 Units by mouth 2 (two) times daily., Disp: , Rfl:  .  citalopram (CELEXA) 20 MG tablet, Take 20 mg by mouth daily., Disp: , Rfl:  .  diclofenac (VOLTAREN) 50 MG EC tablet, TAKE 1 TABLET BY MOUTH ONCE A DAY, Disp: 90 tablet, Rfl: 1 .  diclofenac sodium (VOLTAREN) 1 % GEL, Apply 2 g topically 4 (four) times daily., Disp: 1 Tube, Rfl: 1 .  exemestane (AROMASIN) 25 MG tablet, TAKE 1 TABLET BY MOUTH ONCE A DAY AFTER BREAKFAST, Disp: 90 tablet,  Rfl: 1 .  fluticasone (FLONASE) 50 MCG/ACT nasal spray, Place 2 sprays into both nostrils daily., Disp: , Rfl:  .  hydrochlorothiazide (HYDRODIURIL) 25 MG tablet, Take 25 mg by mouth daily., Disp: , Rfl:  .  montelukast (SINGULAIR) 10 MG tablet, Take 10 mg by mouth at bedtime., Disp: , Rfl:  .  nystatin (MYCOSTATIN/NYSTOP) powder, Apply 1 application topically 3 (three) times daily., Disp: 60 g, Rfl: 0 .  rosuvastatin (CRESTOR) 10 MG tablet, Take 1 tablet by mouth daily., Disp: , Rfl:   Physical exam:  Vitals:   12/05/19 1137  BP: (!) 133/56  Pulse: (!) 58  Resp: 16  SpO2: 97%  Weight: 185 lb (83.9 kg)   Physical Exam Constitutional:      General: She is not in acute distress. Cardiovascular:     Rate and Rhythm: Normal rate and regular rhythm.     Heart sounds: Normal heart sounds.  Pulmonary:     Effort: Pulmonary effort is normal.     Breath sounds: Normal breath sounds.  Abdominal:     General: Bowel sounds are normal.     Palpations: Abdomen is soft.  Musculoskeletal:     Cervical back: Normal range of motion.  Skin:    General: Skin is warm and dry.  Neurological:     Mental Status: She is alert and oriented to person, place, and time.     Breast exam was performed in seated and lying down position. Patient is status post left lumpectomy with a well-healed surgical scar. No evidence of any palpable masses. No evidence of axillary adenopathy. No evidence of any palpable masses or lumps in the  right breast. No evidence of right axillary adenopathy    CMP Latest Ref Rng & Units 05/16/2019  Glucose 70 - 99 mg/dL 101(H)  BUN 8 - 23 mg/dL 23  Creatinine 0.44 - 1.00 mg/dL 0.75  Sodium 135 - 145 mmol/L 136  Potassium 3.5 - 5.1 mmol/L 3.7  Chloride 98 - 111 mmol/L 99  CO2 22 - 32 mmol/L 25  Calcium 8.9 - 10.3 mg/dL 9.6  Total Protein 6.5 - 8.1 g/dL 7.9  Total Bilirubin 0.3 - 1.2 mg/dL 0.7  Alkaline Phos 38 - 126 U/L 99  AST 15 - 41 U/L 26  ALT 0 - 44 U/L 28   CBC Latest Ref Rng & Units 05/16/2019  WBC 4.0 - 10.5 K/uL 4.7  Hemoglobin 12.0 - 15.0 g/dL 12.8  Hematocrit 36 - 46 % 38.8  Platelets 150 - 400 K/uL 219     Assessment and plan- Patient is a 74 y.o. female with h/o Stage IIb pT2N1a(sn)cM0 invasive lobular carcinoma of the left breast ER 10% positive, PR 10% positive, HER-2/neu negative status post lumpectomy/SLND.   Patient is currently on exemestane and this is a routine follow-up visit  Clinically patient is doing well with no concerning signs and symptoms of recurrence based on today's exam.  She is due for a mammogram in October 2021 which will be coordinated by Dr. Lysle Pearl.  Patient will need to continue taking exemestane up until January 2023.  I will see her back in 6 months  Hot flashes: Currently self-limited continue to monitor   Visit Diagnosis 1. Encounter for follow-up surveillance of breast cancer   2. Use of exemestane (Aromasin)      Dr. Randa Evens, MD, MPH [facility] [phone] 12/05/2019 1:18 PM

## 2019-12-22 ENCOUNTER — Other Ambulatory Visit: Payer: Self-pay | Admitting: Surgery

## 2019-12-22 DIAGNOSIS — Z853 Personal history of malignant neoplasm of breast: Secondary | ICD-10-CM

## 2019-12-29 ENCOUNTER — Other Ambulatory Visit: Payer: Self-pay | Admitting: Oncology

## 2020-01-31 ENCOUNTER — Encounter: Payer: Self-pay | Admitting: Orthopaedic Surgery

## 2020-01-31 ENCOUNTER — Ambulatory Visit (INDEPENDENT_AMBULATORY_CARE_PROVIDER_SITE_OTHER): Payer: PPO | Admitting: Orthopaedic Surgery

## 2020-01-31 ENCOUNTER — Ambulatory Visit: Payer: Self-pay

## 2020-01-31 VITALS — Ht 64.0 in | Wt 185.0 lb

## 2020-01-31 DIAGNOSIS — M1712 Unilateral primary osteoarthritis, left knee: Secondary | ICD-10-CM

## 2020-01-31 DIAGNOSIS — M25562 Pain in left knee: Secondary | ICD-10-CM | POA: Diagnosis not present

## 2020-01-31 NOTE — Progress Notes (Signed)
Office Visit Note   Patient: Jamie Curry           Date of Birth: October 19, 1945           MRN: [id number] Visit Date: 01/31/2020              Requested by: Marton Redwood, MD [address] [place],  [place] [address] PCP: Marton Redwood, MD   Assessment & Plan: Visit Diagnoses:  1. Unilateral primary osteoarthritis, left knee   2. Acute pain of left knee     Plan: Really the only thing that I would recommend for her knees would be quad strengthening exercises and outpatient physical therapy to strengthen the muscles around the knees.  She said that she will call us if she decides that she would like to have outpatient physical therapy set up in [place].  Otherwise, follow-up can be as needed.  All questions and concerns were answered and addressed.  Follow-Up Instructions: Return if symptoms worsen or fail to improve.   Orders:  Orders Placed This Encounter  Procedures   XR Knee 1-2 Views Left   No orders of the defined types were placed in this encounter.     Procedures: No procedures performed   Clinical Data: No additional findings.   Subjective: Chief Complaint  Patient presents with   Left Knee - Pain  The patient is someone I have not seen in a while.  She is a very active 74 year old female.  She says she walks her dog about 3 times a day.  She says a week ago she was bending over doing take her dog's leash and she fell over onto her left knee.  She says that her left knee feels weak but it does not hurt at all.  She does not feel like they are swollen either.  She denies any locking or catching.  She has been taking some diclofenac on occasion.  She comes in today just to make sure her knees are doing okay for her.  She said no other acute change in her medical status.  HPI  Review of Systems She currently denies any headache, chest pain, shortness of breath, fever, chills, nausea, vomiting  Objective: Vital Signs: Ht 5\' 4"  (1.626 m)    Wt 185 lb (83.9  kg)    BMI 31.76 kg/m   Physical Exam She is alert and orient x3 and in no acute distress Ortho Exam Examination of both knees showed just some slight swelling medial but this is more the soft tissue.  There is no significant joint effusion with either knee.  Both knees have good range of motion and medial joint line tenderness.  Both knees are ligamentously stable. Specialty Comments:  No specialty comments available.  Imaging: XR Knee 1-2 Views Left  Result Date: 01/31/2020 Views of the left knee show no acute findings.  There is moderate arthritis involving the medial joint line and the patellofemoral joint.  The bone is osteopenic.    PMFS History: Patient Active Problem List   Diagnosis Date Noted   Unilateral primary osteoarthritis, left knee 08/19/2016   Unilateral primary osteoarthritis, right knee 08/19/2016   Hot flashes 03/25/2016   Breast cancer of upper-outer quadrant of left female breast ([place]) 02/14/2016   Hyperlipidemia 11/12/2011   HTN (hypertension) 12/29/2010   Palpitations 12/29/2010   Past Medical History:  Diagnosis Date   Anxiety    Asthma    Breast cancer ([place]) 02/07/2016   left breast invasive lobular carcinoma  CAD (coronary artery disease)    mild CAD by cath in 2001   Depression    Diverticulosis    Heart murmur    HTN (hypertension)    Hyperlipidemia    Palpitations    Personal history of radiation therapy 2018   left breast ca   Syncope    Pre-syncope    Family History  Problem Relation Age of Onset   Cancer Father        oral cancer   Cancer Sister        colon cancer and lymphoma    Past Surgical History:  Procedure Laterality Date   ABDOMINAL HYSTERECTOMY     BREAST BIOPSY Left 02/07/2016   grade II invasive and in situ mammary carcinoma   BREAST CYST ASPIRATION Left 02/13/2016   cyst   BREAST LUMPECTOMY Left 03/03/2016   invasive lobular carcinoma, clear margins, METASTATIC LOBULAR CARCINOMA,  2.5 MM, IN ONE LYMPH NODE (1/1).    CHOLECYSTECTOMY     KNEE ARTHROSCOPY Right    PARTIAL MASTECTOMY WITH NEEDLE LOCALIZATION Left 03/03/2016   Procedure: PARTIAL MASTECTOMY WITH NEEDLE LOCALIZATION;  Surgeon: Leonie Green, MD;  Location: [facility] ORS;  Service: General;  Laterality: Left;   SENTINEL NODE BIOPSY Left 03/03/2016   Procedure: SENTINEL NODE BIOPSY;  Surgeon: Leonie Green, MD;  Location: [facility] ORS;  Service: General;  Laterality: Left;   TUBAL LIGATION     Social History   Occupational History   Not on file  Tobacco Use   Smoking status: Never Smoker   Smokeless tobacco: Never Used  Substance and Sexual Activity   Alcohol use: No    Alcohol/week: 0.0 standard drinks   Drug use: No   Sexual activity: Not on file

## 2020-02-07 DIAGNOSIS — H16223 Keratoconjunctivitis sicca, not specified as Sjogren's, bilateral: Secondary | ICD-10-CM | POA: Diagnosis not present

## 2020-02-12 ENCOUNTER — Ambulatory Visit
Admission: RE | Admit: 2020-02-12 | Discharge: 2020-02-12 | Disposition: A | Discharge status: Home / Self Care | Payer: PPO | Source: Ambulatory Visit | Attending: Surgery | Admitting: Surgery

## 2020-02-12 ENCOUNTER — Other Ambulatory Visit: Payer: Self-pay

## 2020-02-12 DIAGNOSIS — Z853 Personal history of malignant neoplasm of breast: Secondary | ICD-10-CM

## 2020-02-23 DIAGNOSIS — J209 Acute bronchitis, unspecified: Secondary | ICD-10-CM | POA: Diagnosis not present

## 2020-02-23 DIAGNOSIS — B9689 Other specified bacterial agents as the cause of diseases classified elsewhere: Secondary | ICD-10-CM | POA: Diagnosis not present

## 2020-02-23 DIAGNOSIS — Z20822 Contact with and (suspected) exposure to covid-19: Secondary | ICD-10-CM | POA: Diagnosis not present

## 2020-02-23 DIAGNOSIS — J019 Acute sinusitis, unspecified: Secondary | ICD-10-CM | POA: Diagnosis not present

## 2020-02-23 DIAGNOSIS — Z889 Allergy status to unspecified drugs, medicaments and biological substances status: Secondary | ICD-10-CM | POA: Diagnosis not present

## 2020-05-07 ENCOUNTER — Ambulatory Visit
Admission: RE | Admit: 2020-05-07 | Discharge: 2020-05-07 | Disposition: A | Discharge status: Home / Self Care | Payer: PPO | Source: Ambulatory Visit | Attending: Oncology | Admitting: Oncology

## 2020-05-07 ENCOUNTER — Other Ambulatory Visit: Payer: Self-pay

## 2020-05-07 DIAGNOSIS — Z13820 Encounter for screening for osteoporosis: Secondary | ICD-10-CM | POA: Diagnosis not present

## 2020-05-07 DIAGNOSIS — Z923 Personal history of irradiation: Secondary | ICD-10-CM | POA: Diagnosis not present

## 2020-05-07 DIAGNOSIS — Z780 Asymptomatic menopausal state: Secondary | ICD-10-CM | POA: Diagnosis not present

## 2020-05-07 DIAGNOSIS — R29890 Loss of height: Secondary | ICD-10-CM | POA: Diagnosis not present

## 2020-05-07 DIAGNOSIS — M85851 Other specified disorders of bone density and structure, right thigh: Secondary | ICD-10-CM | POA: Diagnosis not present

## 2020-05-07 DIAGNOSIS — Z08 Encounter for follow-up examination after completed treatment for malignant neoplasm: Secondary | ICD-10-CM | POA: Insufficient documentation

## 2020-05-07 DIAGNOSIS — Z853 Personal history of malignant neoplasm of breast: Secondary | ICD-10-CM | POA: Insufficient documentation

## 2020-05-11 DIAGNOSIS — M791 Myalgia, unspecified site: Secondary | ICD-10-CM | POA: Diagnosis not present

## 2020-05-11 DIAGNOSIS — I10 Essential (primary) hypertension: Secondary | ICD-10-CM | POA: Diagnosis not present

## 2020-05-11 DIAGNOSIS — J45909 Unspecified asthma, uncomplicated: Secondary | ICD-10-CM | POA: Diagnosis not present

## 2020-05-11 DIAGNOSIS — F3341 Major depressive disorder, recurrent, in partial remission: Secondary | ICD-10-CM | POA: Diagnosis not present

## 2020-05-11 DIAGNOSIS — E782 Mixed hyperlipidemia: Secondary | ICD-10-CM | POA: Diagnosis not present

## 2020-05-11 DIAGNOSIS — E669 Obesity, unspecified: Secondary | ICD-10-CM | POA: Diagnosis not present

## 2020-05-11 DIAGNOSIS — F418 Other specified anxiety disorders: Secondary | ICD-10-CM | POA: Diagnosis not present

## 2020-05-11 DIAGNOSIS — C50919 Malignant neoplasm of unspecified site of unspecified female breast: Secondary | ICD-10-CM | POA: Diagnosis not present

## 2020-05-15 DIAGNOSIS — H900 Conductive hearing loss, bilateral: Secondary | ICD-10-CM | POA: Diagnosis not present

## 2020-05-15 DIAGNOSIS — H6123 Impacted cerumen, bilateral: Secondary | ICD-10-CM | POA: Diagnosis not present

## 2020-05-23 ENCOUNTER — Other Ambulatory Visit: Payer: Self-pay | Admitting: Orthopaedic Surgery

## 2020-06-06 ENCOUNTER — Other Ambulatory Visit: Payer: Self-pay | Admitting: *Deleted

## 2020-06-06 DIAGNOSIS — C50412 Malignant neoplasm of upper-outer quadrant of left female breast: Secondary | ICD-10-CM

## 2020-06-07 ENCOUNTER — Encounter: Payer: Self-pay | Admitting: Oncology

## 2020-06-07 ENCOUNTER — Inpatient Hospital Stay (HOSPITAL_BASED_OUTPATIENT_CLINIC_OR_DEPARTMENT_OTHER): Payer: PPO | Admitting: Oncology

## 2020-06-07 ENCOUNTER — Inpatient Hospital Stay: Payer: PPO | Attending: Oncology

## 2020-06-07 VITALS — BP 140/68 | HR 60 | Temp 96.8°F | Resp 20 | Wt 183.6 lb

## 2020-06-07 DIAGNOSIS — I10 Essential (primary) hypertension: Secondary | ICD-10-CM | POA: Insufficient documentation

## 2020-06-07 DIAGNOSIS — Z79811 Long term (current) use of aromatase inhibitors: Secondary | ICD-10-CM

## 2020-06-07 DIAGNOSIS — Z170 Estrogen receptor positive status [ER+]: Secondary | ICD-10-CM | POA: Insufficient documentation

## 2020-06-07 DIAGNOSIS — F419 Anxiety disorder, unspecified: Secondary | ICD-10-CM | POA: Diagnosis not present

## 2020-06-07 DIAGNOSIS — J45909 Unspecified asthma, uncomplicated: Secondary | ICD-10-CM | POA: Insufficient documentation

## 2020-06-07 DIAGNOSIS — E785 Hyperlipidemia, unspecified: Secondary | ICD-10-CM | POA: Diagnosis not present

## 2020-06-07 DIAGNOSIS — C50412 Malignant neoplasm of upper-outer quadrant of left female breast: Secondary | ICD-10-CM

## 2020-06-07 DIAGNOSIS — C50212 Malignant neoplasm of upper-inner quadrant of left female breast: Secondary | ICD-10-CM | POA: Insufficient documentation

## 2020-06-07 DIAGNOSIS — Z791 Long term (current) use of non-steroidal anti-inflammatories (NSAID): Secondary | ICD-10-CM | POA: Insufficient documentation

## 2020-06-07 DIAGNOSIS — Z08 Encounter for follow-up examination after completed treatment for malignant neoplasm: Secondary | ICD-10-CM

## 2020-06-07 DIAGNOSIS — I2510 Atherosclerotic heart disease of native coronary artery without angina pectoris: Secondary | ICD-10-CM | POA: Insufficient documentation

## 2020-06-07 DIAGNOSIS — Z79899 Other long term (current) drug therapy: Secondary | ICD-10-CM | POA: Diagnosis not present

## 2020-06-07 DIAGNOSIS — F32A Depression, unspecified: Secondary | ICD-10-CM | POA: Insufficient documentation

## 2020-06-07 DIAGNOSIS — Z853 Personal history of malignant neoplasm of breast: Secondary | ICD-10-CM | POA: Diagnosis not present

## 2020-06-07 LAB — CBC WITH DIFFERENTIAL/PLATELET
Abs Immature Granulocytes: 0.02 K/uL (ref 0.00–0.07)
Basophils Absolute: 0 K/uL (ref 0.0–0.1)
Basophils Relative: 1 %
Eosinophils Absolute: 0.1 K/uL (ref 0.0–0.5)
Eosinophils Relative: 3 %
HCT: 39.3 % (ref 36.0–46.0)
Hemoglobin: 12.9 g/dL (ref 12.0–15.0)
Immature Granulocytes: 1 %
Lymphocytes Relative: 25 %
Lymphs Abs: 1.1 K/uL (ref 0.7–4.0)
MCH: 30.7 pg (ref 26.0–34.0)
MCHC: 32.8 g/dL (ref 30.0–36.0)
MCV: 93.6 fL (ref 80.0–100.0)
Monocytes Absolute: 0.3 K/uL (ref 0.1–1.0)
Monocytes Relative: 8 %
Neutro Abs: 2.8 K/uL (ref 1.7–7.7)
Neutrophils Relative %: 62 %
Platelets: 240 K/uL (ref 150–400)
RBC: 4.2 MIL/uL (ref 3.87–5.11)
RDW: 13.1 % (ref 11.5–15.5)
WBC: 4.4 K/uL (ref 4.0–10.5)
nRBC: 0 % (ref 0.0–0.2)

## 2020-06-07 LAB — COMPREHENSIVE METABOLIC PANEL
ALT: 32 U/L (ref 0–44)
AST: 32 U/L (ref 15–41)
Albumin: 4.5 g/dL (ref 3.5–5.0)
Alkaline Phosphatase: 93 U/L (ref 38–126)
Anion gap: 12 (ref 5–15)
BUN: 18 mg/dL (ref 8–23)
CO2: 27 mmol/L (ref 22–32)
Calcium: 9.4 mg/dL (ref 8.9–10.3)
Chloride: 99 mmol/L (ref 98–111)
Creatinine, Ser: 0.97 mg/dL (ref 0.44–1.00)
GFR, Estimated: >60 mL/min (ref >60)
Glucose, Bld: 151 mg/dL — ABNORMAL HIGH (ref 70–99)
Potassium: 4.4 mmol/L (ref 3.5–5.1)
Sodium: 138 mmol/L (ref 135–145)
Total Bilirubin: 0.9 mg/dL (ref 0.3–1.2)
Total Protein: 7.9 g/dL (ref 6.5–8.1)

## 2020-06-07 MED ORDER — EXEMESTANE 25 MG PO TABS: ORAL_TABLET | ORAL | 1 refills | Status: DC

## 2020-06-07 NOTE — Progress Notes (Signed)
Hematology/Oncology Consult note [facility]  [phone] [phone]  Patient Care Team: Marton Redwood, MD as PCP - General (Internal Medicine)   Name of the patient: Jamie Curry  [id number]  05/19/1945   Date of visit: 06/07/20  Diagnosis- Stage IIb pT2N1a(sn)cM0 invasive lobular carcinoma of the left breast ER 10% positive, PR 10% positive, HER-2/neu negative status post lumpectomy/SLND  Chief complaint/ Reason for visit-routine follow-up of breast cancer on Aromasin  Heme/Onc history:  Oncology History  Breast cancer of upper-outer quadrant of left female breast ([place])  02/15/2008 Imaging   Small  probable mildly complicated cysts within the upper outer quadrant right breast at the 11 o'clock position in the region of questionable nodularity noted on mammography.  Recommend follow-up right breast diagnostic mammogram and ultrasound in 6 months.      08/13/2008 Imaging   Ultrasound is performed, showing stable small minimal complicated cysts at the right breast 11 o'clock position 4 cm from the nipple, not changed compared prior exam   09/23/2010 Imaging   1.3 cm simple cyst located within the left breast at the 12 o'clock position corresponding to the mammographic finding.  No findings worrisome for malignancy.  Recommend annual screening mammography   09/23/2010 Mammogram   1.3 cm simple cyst located within the left breast at the 12 o'clock position corresponding to the mammographic finding.  No findings worrisome for malignancy.    02/05/2015 Imaging   A 2.5 x 3.5 cm oval circumscribed mass within the upper retroareolar left breast is identified. No suspicious calcifications or distortion identified.  On physical exam, a firm palpable mobile mass is identified at the 11:30 position of the left breast 4 cm from the nipple. Targeted ultrasound is performed, showing a 2.8 x 1.4 x 3.6 cm simple cyst at the 11:30 position of the left  breast, corresponding to the screening study finding.   02/07/2016 Imaging   Targeted ultrasound of the left breast was performed demonstrating an irregular shadowing hypoechoic mass at 1 o'clock 5 cm from nipple measuring approximately 2.3 x 1 x 1.6 cm. This corresponds well with mammography findings. No lymphadenopathy seen in the left axilla.   02/07/2016 Mammogram   Note that the patient has a stable oval circumscribed mass in the retroareolar left breast previously characterized as a cyst. Although this cyst appears stable when compared to 2016 it is overall increasing in size and therefore given the patient's highly suspicious mass in the upper-outer left breast aspiration of the retroareolar left breast cyst is warranted.   02/07/2016 Procedure   She had US guided biopsy of breast mass   02/07/2016 Pathology Results   Accession: [id number]: Biopsy confirmed invasive breast cancer, ER 10% positive, PR 10% positive and Her2/neu negative, Ki 67 15%  Additional molecular study with MammaPrint result came back low risk (10% risk of recurrence in 10 years)   02/27/2016 Imaging   MR breast showed area of known malignancy in the upper-outer quadrant of the left breast, measuring maximum of 4.0 cm (anterior-posterior) an associated clip artifact. No findings to indicate multicentric, multifocal, or contralateral malignancy. No MRI evidence for adenopathy   03/03/2016 Surgery   She underwent left partial mastectomy and sentinel lymph node biopsy   03/03/2016 Pathology Results   CASE: [id number] Invasive lobular carcinoma 2.3 cm, Histologic Grade (Nottingham Histologic Score) Glandular (Acinar)/Tubular Differentiation: Score 3 Nuclear Pleomorphism: Score 2 Mitotic Rate: Score 1 Total score: 6 Overall Grade: Grade 2 Tumor Size: 23 mm. Size  of lymph node invasion is 2.5 mm    Patient was switched briefly to tamoxifen but preferd to go back to aromasin  Interval history-patient is  tolerating Aromasin along with calcium and vitamin D well.  Denies any breast concerns at this time.  Appetite and weight have remained stable.  Denies any new aches and pains anywhere.  ECOG PS- 1 Pain scale- 0   Review of systems- Review of Systems  Constitutional: Negative for chills, fever, malaise/fatigue and weight loss.  HENT: Negative for congestion, ear discharge and nosebleeds.   Eyes: Negative for blurred vision.  Respiratory: Negative for cough, hemoptysis, sputum production, shortness of breath and wheezing.   Cardiovascular: Negative for chest pain, palpitations, orthopnea and claudication.  Gastrointestinal: Negative for abdominal pain, blood in stool, constipation, diarrhea, heartburn, melena, nausea and vomiting.  Genitourinary: Negative for dysuria, flank pain, frequency, hematuria and urgency.  Musculoskeletal: Negative for back pain, joint pain (Chronic left knee pain) and myalgias.  Skin: Negative for rash.  Neurological: Negative for dizziness, tingling, focal weakness, seizures, weakness and headaches.  Endo/Heme/Allergies: Does not bruise/bleed easily.  Psychiatric/Behavioral: Negative for depression and suicidal ideas. The patient does not have insomnia.        Allergies  Allergen Reactions  . Penicillins Anaphylaxis    REACTION: severe anaphylaxis Has patient had a PCN reaction causing immediate rash, facial/tongue/throat swelling, SOB or lightheadedness with hypotension: Yes Has patient had a PCN reaction causing severe rash involving mucus membranes or skin necrosis: No Has patient had a PCN reaction that required hospitalization No Has patient had a PCN reaction occurring within the last 10 years: No If all of the above answers are "NO", then may proceed with Cephalosporin use.   Marland Kitchen Zithromax [Azithromycin] Rash    z-pack = rash  . Anastrozole Other (See Comments)    Extreme sweating  . Cefuroxime Axetil Other (See Comments)    unknown  . Cefuroxime  Axetil Other (See Comments)  . Codeine Other (See Comments)    unknown  . Erythromycin Other (See Comments)    unknown  . Nitrofurantoin Other (See Comments)    unknown  . Ofloxacin Other (See Comments)    unknown  . Propoxyphene Other (See Comments)  . Propoxyphene N-Acetaminophen Other (See Comments)    unknown  . Sulfonamide Derivatives Other (See Comments)    unknown  . Tetracycline Other (See Comments)    unknown  . Sulfa Antibiotics Other (See Comments) and Rash     Past Medical History:  Diagnosis Date  . Anxiety   . Asthma   . Breast cancer ([place]) 02/07/2016   left breast invasive lobular carcinoma  . CAD (coronary artery disease)    mild CAD by cath in 2001  . Depression   . Diverticulosis   . Heart murmur   . HTN (hypertension)   . Hyperlipidemia   . Palpitations   . Personal history of radiation therapy 2018   left breast ca  . Syncope    Pre-syncope     Past Surgical History:  Procedure Laterality Date  . ABDOMINAL HYSTERECTOMY    . BREAST BIOPSY Left 02/07/2016   grade II invasive and in situ mammary carcinoma  . BREAST CYST ASPIRATION Left 02/13/2016   cyst  . BREAST LUMPECTOMY Left 03/03/2016   invasive lobular carcinoma, clear margins, METASTATIC LOBULAR CARCINOMA, 2.5 MM, IN ONE LYMPH NODE (1/1).   . CHOLECYSTECTOMY    . KNEE ARTHROSCOPY Right   . PARTIAL MASTECTOMY WITH NEEDLE LOCALIZATION  Left 03/03/2016   Procedure: PARTIAL MASTECTOMY WITH NEEDLE LOCALIZATION;  Surgeon: Leonie Green, MD;  Location: [facility] ORS;  Service: General;  Laterality: Left;  . SENTINEL NODE BIOPSY Left 03/03/2016   Procedure: SENTINEL NODE BIOPSY;  Surgeon: Leonie Green, MD;  Location: [facility] ORS;  Service: General;  Laterality: Left;  . TUBAL LIGATION      Social History   Socioeconomic History  . Marital status: Married    Spouse name: Not on file  . Number of children: Not on file  . Years of education: Not on file  . Highest education level: Not  on file  Occupational History  . Not on file  Tobacco Use  . Smoking status: Never Smoker  . Smokeless tobacco: Never Used  Substance and Sexual Activity  . Alcohol use: No    Alcohol/week: 0.0 standard drinks  . Drug use: No  . Sexual activity: Not on file  Other Topics Concern  . Not on file  Social History Narrative  . Not on file   Social Determinants of Health   Financial Resource Strain: Not on file  Food Insecurity: Not on file  Transportation Needs: Not on file  Physical Activity: Not on file  Stress: Not on file  Social Connections: Not on file  Intimate Partner Violence: Not on file    Family History  Problem Relation Age of Onset  . Cancer Father        oral cancer  . Cancer Sister        colon cancer and lymphoma     Current Outpatient Medications:  .  acetaminophen (TYLENOL) 500 MG tablet, Take 1,000 mg by mouth every 6 (six) hours as needed for mild pain., Disp: , Rfl:  .  ALPRAZolam (XANAX) 0.5 MG tablet, Take 0.5 mg by mouth at bedtime as needed for anxiety., Disp: , Rfl:  .  amLODipine (NORVASC) 5 MG tablet, Take 5 mg by mouth daily., Disp: , Rfl:  .  Calcium Carb-Cholecalciferol 500-400 MG-UNIT CHEW, Chew 1,200 Units by mouth 2 (two) times daily., Disp: , Rfl:  .  citalopram (CELEXA) 20 MG tablet, Take 20 mg by mouth daily., Disp: , Rfl:  .  diclofenac (VOLTAREN) 50 MG EC tablet, TAKE 1 TABLET BY MOUTH ONCE A DAY, Disp: 90 tablet, Rfl: 1 .  exemestane (AROMASIN) 25 MG tablet, TAKE 1 TABLET BY MOUTH ONCE A DAY AFTER BREAKFAST, Disp: 90 tablet, Rfl: 1 .  fluticasone (FLONASE) 50 MCG/ACT nasal spray, Place 2 sprays into both nostrils daily., Disp: , Rfl:  .  hydrochlorothiazide (HYDRODIURIL) 25 MG tablet, Take 25 mg by mouth daily., Disp: , Rfl:  .  montelukast (SINGULAIR) 10 MG tablet, Take 10 mg by mouth at bedtime., Disp: , Rfl:  .  nystatin (MYCOSTATIN/NYSTOP) powder, Apply 1 application topically 3 (three) times daily., Disp: 60 g, Rfl: 0 .   rosuvastatin (CRESTOR) 10 MG tablet, Take 1 tablet by mouth daily., Disp: , Rfl:  .  busPIRone (BUSPAR) 15 MG tablet, Take 15 mg by mouth daily., Disp: , Rfl:   Physical exam:  Vitals:   06/07/20 1015  BP: 140/68  Pulse: 60  Resp: 20  Temp: (!) 96.8 F (36 C)  TempSrc: Tympanic  SpO2: 99%  Weight: 183 lb 9.6 oz (83.3 kg)   Physical Exam HENT:     Head: Normocephalic and atraumatic.  Eyes:     Extraocular Movements: EOM normal.     Pupils: Pupils are equal, round, and reactive to  light.  Cardiovascular:     Rate and Rhythm: Normal rate and regular rhythm.     Heart sounds: Normal heart sounds.  Pulmonary:     Effort: Pulmonary effort is normal.     Breath sounds: Normal breath sounds.  Abdominal:     General: Bowel sounds are normal.     Palpations: Abdomen is soft.  Musculoskeletal:     Cervical back: Normal range of motion.  Skin:    General: Skin is warm and dry.  Neurological:     Mental Status: She is alert and oriented to person, place, and time.    Breast exam was performed in seated and lying down position. Patient is status post left lumpectomy with a well-healed surgical scar. No evidence of any palpable masses. No evidence of axillary adenopathy. No evidence of any palpable masses or lumps in the right breast. No evidence of right axillary adenopathy   CMP Latest Ref Rng & Units 06/07/2020  Glucose 70 - 99 mg/dL 151(H)  BUN 8 - 23 mg/dL 18  Creatinine 0.44 - 1.00 mg/dL 0.97  Sodium 135 - 145 mmol/L 138  Potassium 3.5 - 5.1 mmol/L 4.4  Chloride 98 - 111 mmol/L 99  CO2 22 - 32 mmol/L 27  Calcium 8.9 - 10.3 mg/dL 9.4  Total Protein 6.5 - 8.1 g/dL 7.9  Total Bilirubin 0.3 - 1.2 mg/dL 0.9  Alkaline Phos 38 - 126 U/L 93  AST 15 - 41 U/L 32  ALT 0 - 44 U/L 32   CBC Latest Ref Rng & Units 06/07/2020  WBC 4.0 - 10.5 K/uL 4.4  Hemoglobin 12.0 - 15.0 g/dL 12.9  Hematocrit 36.0 - 46.0 % 39.3  Platelets 150 - 400 K/uL 240     Assessment and plan- Patient is  a 75 y.o. female with h/o Stage IIb pT2N1a(sn)cM0 invasive lobular carcinoma of the left breast ER 10% positive, PR 10% positive, HER-2/neu negative status post lumpectomy/SLND.  This is a routine follow-up of breast cancer on Aromasin  Clinically patient is doing well with no concerning signs and symptoms of recurrence based on today's exam.  She is tolerating Aromasin with calcium and vitamin D well and will continue taking it until next year.  Recent mammogram from October 2021 was within normal limits.  I also discussed the results of bone density scan which shows osteopenia in her right femur which is remained overall stable over the last 2 years.  I will see her back in 6 months no labs   Visit Diagnosis 1. Encounter for follow-up surveillance of breast cancer   2. Use of exemestane (Aromasin)      Dr. Randa Evens, MD, MPH [facility] [phone] 06/07/2020 10:32 AM

## 2020-06-10 DIAGNOSIS — I10 Essential (primary) hypertension: Secondary | ICD-10-CM | POA: Diagnosis not present

## 2020-06-10 DIAGNOSIS — E7849 Other hyperlipidemia: Secondary | ICD-10-CM | POA: Diagnosis not present

## 2020-06-10 DIAGNOSIS — I2510 Atherosclerotic heart disease of native coronary artery without angina pectoris: Secondary | ICD-10-CM | POA: Diagnosis not present

## 2020-07-11 DIAGNOSIS — E7849 Other hyperlipidemia: Secondary | ICD-10-CM | POA: Diagnosis not present

## 2020-07-11 DIAGNOSIS — I10 Essential (primary) hypertension: Secondary | ICD-10-CM | POA: Diagnosis not present

## 2020-07-11 DIAGNOSIS — I2510 Atherosclerotic heart disease of native coronary artery without angina pectoris: Secondary | ICD-10-CM | POA: Diagnosis not present

## 2020-08-10 DIAGNOSIS — E7849 Other hyperlipidemia: Secondary | ICD-10-CM | POA: Diagnosis not present

## 2020-08-10 DIAGNOSIS — I2510 Atherosclerotic heart disease of native coronary artery without angina pectoris: Secondary | ICD-10-CM | POA: Diagnosis not present

## 2020-08-10 DIAGNOSIS — I10 Essential (primary) hypertension: Secondary | ICD-10-CM | POA: Diagnosis not present

## 2020-08-14 DIAGNOSIS — F411 Generalized anxiety disorder: Secondary | ICD-10-CM | POA: Diagnosis not present

## 2020-08-14 DIAGNOSIS — F3341 Major depressive disorder, recurrent, in partial remission: Secondary | ICD-10-CM | POA: Diagnosis not present

## 2020-08-31 DIAGNOSIS — R300 Dysuria: Secondary | ICD-10-CM | POA: Diagnosis not present

## 2020-08-31 DIAGNOSIS — Z889 Allergy status to unspecified drugs, medicaments and biological substances status: Secondary | ICD-10-CM | POA: Diagnosis not present

## 2020-08-31 DIAGNOSIS — Z03818 Encounter for observation for suspected exposure to other biological agents ruled out: Secondary | ICD-10-CM | POA: Diagnosis not present

## 2020-08-31 DIAGNOSIS — J019 Acute sinusitis, unspecified: Secondary | ICD-10-CM | POA: Diagnosis not present

## 2020-08-31 DIAGNOSIS — Z20822 Contact with and (suspected) exposure to covid-19: Secondary | ICD-10-CM | POA: Diagnosis not present

## 2020-08-31 DIAGNOSIS — B9689 Other specified bacterial agents as the cause of diseases classified elsewhere: Secondary | ICD-10-CM | POA: Diagnosis not present

## 2020-09-06 ENCOUNTER — Encounter: Payer: Self-pay | Admitting: Radiation Oncology

## 2020-09-06 ENCOUNTER — Ambulatory Visit
Admission: RE | Admit: 2020-09-06 | Discharge: 2020-09-06 | Disposition: A | Discharge status: Home / Self Care | Payer: PPO | Source: Ambulatory Visit | Attending: Radiation Oncology | Admitting: Radiation Oncology

## 2020-09-06 VITALS — BP 130/67 | HR 64 | Temp 97.8°F | Wt 183.0 lb

## 2020-09-06 DIAGNOSIS — Z79811 Long term (current) use of aromatase inhibitors: Secondary | ICD-10-CM | POA: Diagnosis not present

## 2020-09-06 DIAGNOSIS — C50412 Malignant neoplasm of upper-outer quadrant of left female breast: Secondary | ICD-10-CM | POA: Diagnosis not present

## 2020-09-06 DIAGNOSIS — Z923 Personal history of irradiation: Secondary | ICD-10-CM | POA: Diagnosis not present

## 2020-09-06 DIAGNOSIS — Z170 Estrogen receptor positive status [ER+]: Secondary | ICD-10-CM | POA: Insufficient documentation

## 2020-09-06 DIAGNOSIS — Z08 Encounter for follow-up examination after completed treatment for malignant neoplasm: Secondary | ICD-10-CM | POA: Diagnosis not present

## 2020-09-06 NOTE — Progress Notes (Signed)
Radiation Oncology Follow up Note  Name: Jamie Curry   Date:   09/06/2020 MRN:  [id number] DOB: April 27, 1945    This 75 y.o. female presents to the clinic today for 4-year follow-up status post whole breast radiation to her left breast for stage IIb (T2 N1 M0) invasive lobular carcinoma weakly ER/PR positive.  REFERRING PROVIDER: Ginger Organ., MD  HPI: Patient is a 75 year old female now seen after 4 years having completed whole breast radiation to her left breast for stage IIb invasive lobular carcinoma weakly ER/PR positive.  Seen today in routine follow-up she is doing well.  She specifically denies breast tenderness cough or bone pain..  She mammograms back in November which I have reviewed were BI-RADS 2 benign.  She is currently on Aromasin tolerating it well without side effects.  COMPLICATIONS OF TREATMENT: none  FOLLOW UP COMPLIANCE: keeps appointments   PHYSICAL EXAM:  BP 130/67   Pulse 64   Temp 97.8 F (36.6 C) (Tympanic)   Wt 183 lb (83 kg)   BMI 31.41 kg/m  Lungs are clear to A&P cardiac examination essentially unremarkable with regular rate and rhythm. No dominant mass or nodularity is noted in either breast in 2 positions examined. Incision is well-healed. No axillary or supraclavicular adenopathy is appreciated. Cosmetic result is excellent.  Well-developed well-nourished patient in NAD. HEENT reveals PERLA, EOMI, discs not visualized.  Oral cavity is clear. No oral mucosal lesions are identified. Neck is clear without evidence of cervical or supraclavicular adenopathy. Lungs are clear to A&P. Cardiac examination is essentially unremarkable with regular rate and rhythm without murmur rub or thrill. Abdomen is benign with no organomegaly or masses noted. Motor sensory and DTR levels are equal and symmetric in the upper and lower extremities. Cranial nerves II through XII are grossly intact. Proprioception is intact. No peripheral adenopathy or edema is identified. No  motor or sensory levels are noted. Crude visual fields are within normal range.  RADIOLOGY RESULTS: Mammograms reviewed compatible with above-stated findings  PLAN: Present time patient is now out over 4 years from whole breast radiation.  I will turn follow-up care over to medical oncology.  I be happy to reevaluate the patient anytime should further radiation consultation be indicated.  Patient knows to call with any concerns.  I would like to take this opportunity to thank you for allowing me to participate in the care of your patient.Noreene Filbert, MD

## 2020-09-20 ENCOUNTER — Other Ambulatory Visit: Payer: Self-pay

## 2020-09-20 ENCOUNTER — Encounter: Payer: Self-pay | Admitting: Dermatology

## 2020-09-20 ENCOUNTER — Ambulatory Visit (INDEPENDENT_AMBULATORY_CARE_PROVIDER_SITE_OTHER): Payer: PPO | Admitting: Dermatology

## 2020-09-20 DIAGNOSIS — L719 Rosacea, unspecified: Secondary | ICD-10-CM

## 2020-09-20 DIAGNOSIS — L720 Epidermal cyst: Secondary | ICD-10-CM

## 2020-09-20 DIAGNOSIS — Z86007 Personal history of in-situ neoplasm of skin: Secondary | ICD-10-CM

## 2020-09-20 DIAGNOSIS — L578 Other skin changes due to chronic exposure to nonionizing radiation: Secondary | ICD-10-CM

## 2020-09-20 DIAGNOSIS — L570 Actinic keratosis: Secondary | ICD-10-CM | POA: Diagnosis not present

## 2020-09-20 DIAGNOSIS — L821 Other seborrheic keratosis: Secondary | ICD-10-CM

## 2020-09-20 NOTE — Progress Notes (Signed)
   Follow-Up Visit   Subjective  Jamie Curry is a 75 y.o. female who presents for the following: check sore (Nose, 29m) and check spots (Abdomen x 2, itchy).  The following portions of the chart were reviewed this encounter and updated as appropriate:   Tobacco  Allergies  Meds  Problems  Med Hx  Surg Hx  Fam Hx      Review of Systems:  No other skin or systemic complaints except as noted in HPI or Assessment and Plan.  Objective  Well appearing patient in no apparent distress; mood and affect are within normal limits.  A focused examination was performed including face, abdomen. Relevant physical exam findings are noted in the Assessment and Plan.  face Smooth white papule(s).   L dorsum and L nose x 3 (3) Pink scaly macules   R lat base of neck Well healed scar with no evidence of recurrence  Nose Erythema nose   Assessment & Plan   Actinic Damage - chronic, secondary to cumulative UV radiation exposure/sun exposure over time - diffuse scaly erythematous macules with underlying dyspigmentation - Recommend daily broad spectrum sunscreen SPF 30+ to sun-exposed areas, reapply every 2 hours as needed.  - Recommend staying in the shade or wearing long sleeves, sun glasses (UVA+UVB protection) and wide brim hats (4-inch brim around the entire circumference of the hat). - Call for new or changing lesions.  Seborrheic Keratoses - Stuck-on, waxy, tan-brown papules and/or plaques  - Benign-appearing - Discussed benign etiology and prognosis. - Observe - Call for any changes  Milia face  Benign, observe.    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 Observe for recurrence. Call clinic for new or changing lesions.  Recommend regular skin exams, daily broad-spectrum spf 30+ sunscreen use, and photoprotection.     Rosacea Nose  Rosacea is a chronic progressive skin condition usually affecting the face of adults, causing redness and/or acne bumps. It is treatable but not curable. It sometimes affects the eyes (ocular rosacea) as well. It may respond to topical and/or systemic medication and can flare with stress, sun exposure, alcohol, exercise and some foods.  Daily application of broad spectrum spf 30+ sunscreen to face is recommended to reduce flares.  No txt today, recheck on f/u  Return in about 4 months (around 01/20/2021) for AK f/u, re evaluate rosacea.  I, Othelia Pulling, RMA, am acting as scribe for Sarina Ser, MD . Documentation: I have reviewed the above documentation for accuracy and completeness, and I agree with the above.  Sarina Ser, MD

## 2020-09-20 NOTE — Patient Instructions (Signed)

## 2020-09-23 ENCOUNTER — Encounter: Payer: Self-pay | Admitting: Dermatology

## 2020-10-08 DIAGNOSIS — F3341 Major depressive disorder, recurrent, in partial remission: Secondary | ICD-10-CM | POA: Diagnosis not present

## 2020-10-08 DIAGNOSIS — C50912 Malignant neoplasm of unspecified site of left female breast: Secondary | ICD-10-CM | POA: Diagnosis not present

## 2020-10-08 DIAGNOSIS — M791 Myalgia, unspecified site: Secondary | ICD-10-CM | POA: Diagnosis not present

## 2020-10-08 DIAGNOSIS — K5909 Other constipation: Secondary | ICD-10-CM | POA: Diagnosis not present

## 2020-10-08 DIAGNOSIS — R82998 Other abnormal findings in urine: Secondary | ICD-10-CM | POA: Diagnosis not present

## 2020-10-08 DIAGNOSIS — J45909 Unspecified asthma, uncomplicated: Secondary | ICD-10-CM | POA: Diagnosis not present

## 2020-10-08 DIAGNOSIS — Z0000 Encounter for general adult medical examination without abnormal findings: Secondary | ICD-10-CM | POA: Diagnosis not present

## 2020-10-08 DIAGNOSIS — R001 Bradycardia, unspecified: Secondary | ICD-10-CM | POA: Diagnosis not present

## 2020-10-08 DIAGNOSIS — Z1331 Encounter for screening for depression: Secondary | ICD-10-CM | POA: Diagnosis not present

## 2020-10-08 DIAGNOSIS — D692 Other nonthrombocytopenic purpura: Secondary | ICD-10-CM | POA: Diagnosis not present

## 2020-10-08 DIAGNOSIS — R7301 Impaired fasting glucose: Secondary | ICD-10-CM | POA: Diagnosis not present

## 2020-10-08 DIAGNOSIS — G8929 Other chronic pain: Secondary | ICD-10-CM | POA: Diagnosis not present

## 2020-10-08 DIAGNOSIS — Z1211 Encounter for screening for malignant neoplasm of colon: Secondary | ICD-10-CM | POA: Diagnosis not present

## 2020-10-08 DIAGNOSIS — M859 Disorder of bone density and structure, unspecified: Secondary | ICD-10-CM | POA: Diagnosis not present

## 2020-10-08 DIAGNOSIS — E669 Obesity, unspecified: Secondary | ICD-10-CM | POA: Diagnosis not present

## 2020-10-08 DIAGNOSIS — Z1339 Encounter for screening examination for other mental health and behavioral disorders: Secondary | ICD-10-CM | POA: Diagnosis not present

## 2020-10-08 DIAGNOSIS — C50312 Malignant neoplasm of lower-inner quadrant of left female breast: Secondary | ICD-10-CM | POA: Diagnosis not present

## 2020-10-08 DIAGNOSIS — I2510 Atherosclerotic heart disease of native coronary artery without angina pectoris: Secondary | ICD-10-CM | POA: Diagnosis not present

## 2020-10-08 DIAGNOSIS — R1032 Left lower quadrant pain: Secondary | ICD-10-CM | POA: Diagnosis not present

## 2020-10-08 DIAGNOSIS — Z170 Estrogen receptor positive status [ER+]: Secondary | ICD-10-CM | POA: Diagnosis not present

## 2020-10-08 DIAGNOSIS — E782 Mixed hyperlipidemia: Secondary | ICD-10-CM | POA: Diagnosis not present

## 2020-10-08 DIAGNOSIS — I6521 Occlusion and stenosis of right carotid artery: Secondary | ICD-10-CM | POA: Diagnosis not present

## 2020-10-08 DIAGNOSIS — I10 Essential (primary) hypertension: Secondary | ICD-10-CM | POA: Diagnosis not present

## 2020-11-10 DIAGNOSIS — I10 Essential (primary) hypertension: Secondary | ICD-10-CM | POA: Diagnosis not present

## 2020-11-10 DIAGNOSIS — I2510 Atherosclerotic heart disease of native coronary artery without angina pectoris: Secondary | ICD-10-CM | POA: Diagnosis not present

## 2020-11-10 DIAGNOSIS — E7849 Other hyperlipidemia: Secondary | ICD-10-CM | POA: Diagnosis not present

## 2020-11-14 DIAGNOSIS — H6123 Impacted cerumen, bilateral: Secondary | ICD-10-CM | POA: Diagnosis not present

## 2020-11-14 DIAGNOSIS — H903 Sensorineural hearing loss, bilateral: Secondary | ICD-10-CM | POA: Diagnosis not present

## 2020-11-19 DIAGNOSIS — H35033 Hypertensive retinopathy, bilateral: Secondary | ICD-10-CM | POA: Diagnosis not present

## 2020-12-04 ENCOUNTER — Other Ambulatory Visit: Payer: Self-pay | Admitting: Oncology

## 2020-12-05 ENCOUNTER — Ambulatory Visit: Payer: PPO | Admitting: Oncology

## 2020-12-18 ENCOUNTER — Other Ambulatory Visit: Payer: Self-pay

## 2020-12-18 MED ORDER — DICLOFENAC SODIUM 50 MG PO TBEC: 50.0000 mg | DELAYED_RELEASE_TABLET | Freq: Every day | ORAL | 1 refills | Status: DC

## 2021-01-06 ENCOUNTER — Other Ambulatory Visit: Payer: Self-pay | Admitting: *Deleted

## 2021-01-06 ENCOUNTER — Inpatient Hospital Stay: Payer: PPO | Attending: Oncology | Admitting: Oncology

## 2021-01-06 ENCOUNTER — Encounter: Payer: Self-pay | Admitting: Oncology

## 2021-01-06 VITALS — BP 134/55 | HR 57 | Temp 96.9°F | Resp 18 | Wt 184.0 lb

## 2021-01-06 DIAGNOSIS — M8580 Other specified disorders of bone density and structure, unspecified site: Secondary | ICD-10-CM | POA: Insufficient documentation

## 2021-01-06 DIAGNOSIS — Z853 Personal history of malignant neoplasm of breast: Secondary | ICD-10-CM

## 2021-01-06 DIAGNOSIS — Z170 Estrogen receptor positive status [ER+]: Secondary | ICD-10-CM | POA: Insufficient documentation

## 2021-01-06 DIAGNOSIS — Z08 Encounter for follow-up examination after completed treatment for malignant neoplasm: Secondary | ICD-10-CM | POA: Diagnosis not present

## 2021-01-06 DIAGNOSIS — C50412 Malignant neoplasm of upper-outer quadrant of left female breast: Secondary | ICD-10-CM | POA: Diagnosis not present

## 2021-01-06 DIAGNOSIS — F419 Anxiety disorder, unspecified: Secondary | ICD-10-CM | POA: Diagnosis not present

## 2021-01-06 DIAGNOSIS — Z79811 Long term (current) use of aromatase inhibitors: Secondary | ICD-10-CM | POA: Diagnosis not present

## 2021-01-06 NOTE — Progress Notes (Signed)
Hematology/Oncology Consult note [facility]  [phone] [phone]  Patient Care Team: Ginger Organ., MD as PCP - General (Internal Medicine)   Name of the patient: Jamie Curry  [id number]  1945-04-21   Date of visit: 01/06/21  Diagnosis- Stage IIb pT2N1a(sn)cM0 invasive lobular carcinoma of the left breast ER 10% positive, PR 10% positive, HER-2/neu negative status post lumpectomy/SLND  Chief complaint/ Reason for visit-routine follow-up of breast cancer on Aromasin  Heme/Onc history:  Oncology History  Breast cancer of upper-outer quadrant of left female breast ([place])  02/15/2008 Imaging   Small  probable mildly complicated cysts within the upper outer quadrant right breast at the 11 o'clock position in the region of questionable nodularity noted on mammography.  Recommend follow-up right breast diagnostic mammogram and ultrasound in 6 months.      08/13/2008 Imaging   Ultrasound is performed, showing stable small minimal complicated cysts at the right breast 11 o'clock position 4 cm from the nipple, not changed compared prior exam   09/23/2010 Imaging   1.3 cm simple cyst located within the left breast at the 12 o'clock position corresponding to the mammographic finding.  No findings worrisome for malignancy.  Recommend annual screening mammography   09/23/2010 Mammogram   1.3 cm simple cyst located within the left breast at the 12 o'clock position corresponding to the mammographic finding.  No findings worrisome for malignancy.    02/05/2015 Imaging   A 2.5 x 3.5 cm oval circumscribed mass within the upper retroareolar left breast is identified. No suspicious calcifications or distortion identified.   On physical exam, a firm palpable mobile mass is identified at the 11:30 position of the left breast 4 cm from the nipple. Targeted ultrasound is performed, showing a 2.8 x 1.4 x 3.6 cm simple cyst at the 11:30 position of the  left breast, corresponding to the screening study finding.   02/07/2016 Imaging   Targeted ultrasound of the left breast was performed demonstrating an irregular shadowing hypoechoic mass at 1 o'clock 5 cm from nipple measuring approximately 2.3 x 1 x 1.6 cm. This corresponds well with mammography findings. No lymphadenopathy seen in the left axilla.   02/07/2016 Mammogram   Note that the patient has a stable oval circumscribed mass in the retroareolar left breast previously characterized as a cyst. Although this cyst appears stable when compared to 2016 it is overall increasing in size and therefore given the patient's highly suspicious mass in the upper-outer left breast aspiration of the retroareolar left breast cyst is warranted.   02/07/2016 Procedure   She had US guided biopsy of breast mass   02/07/2016 Pathology Results   Accession: [id number]: Biopsy confirmed invasive breast cancer, ER 10% positive, PR 10% positive and Her2/neu negative, Ki 67 15%  Additional molecular study with MammaPrint result came back low risk (10% risk of recurrence in 10 years)   02/27/2016 Imaging   MR breast showed area of known malignancy in the upper-outer quadrant of the left breast, measuring maximum of 4.0 cm (anterior-posterior) an associated clip artifact. No findings to indicate multicentric, multifocal, or contralateral malignancy. No MRI evidence for adenopathy   03/03/2016 Surgery   She underwent left partial mastectomy and sentinel lymph node biopsy   03/03/2016 Pathology Results   CASE: [id number] Invasive lobular carcinoma 2.3 cm, Histologic Grade (Nottingham Histologic Score) Glandular (Acinar)/Tubular Differentiation: Score 3 Nuclear Pleomorphism: Score 2 Mitotic Rate: Score 1 Total score: 6 Overall Grade: Grade 2 Tumor Size:  23 mm. Size of lymph node invasion is 2.5 mm    Patient was switched briefly to tamoxifen but preferd to go back to aromasin  Interval history-patient is  tolerating Aromasin well without any significant side effects.  She is also taking her calcium and vitamin D.  Has occasional hot flashes which are overall self-limited.  She does have ongoing problems with anxiety For which she is currently on BuSpar as well as Xanax and Celexa.  ECOG PS- 1 Pain scale- 0  Review of systems- Review of Systems  Constitutional:  Positive for malaise/fatigue. Negative for chills, fever and weight loss.  HENT:  Negative for congestion, ear discharge and nosebleeds.   Eyes:  Negative for blurred vision.  Respiratory:  Negative for cough, hemoptysis, sputum production, shortness of breath and wheezing.   Cardiovascular:  Negative for chest pain, palpitations, orthopnea and claudication.  Gastrointestinal:  Negative for abdominal pain, blood in stool, constipation, diarrhea, heartburn, melena, nausea and vomiting.  Genitourinary:  Negative for dysuria, flank pain, frequency, hematuria and urgency.  Musculoskeletal:  Negative for back pain, joint pain and myalgias.  Skin:  Negative for rash.  Neurological:  Negative for dizziness, tingling, focal weakness, seizures, weakness and headaches.  Endo/Heme/Allergies:  Does not bruise/bleed easily.  Psychiatric/Behavioral:  Negative for depression and suicidal ideas. The patient is nervous/anxious. The patient does not have insomnia.    Allergies  Allergen Reactions   Penicillins Anaphylaxis    REACTION: severe anaphylaxis Has patient had a PCN reaction causing immediate rash, facial/tongue/throat swelling, SOB or lightheadedness with hypotension: Yes Has patient had a PCN reaction causing severe rash involving mucus membranes or skin necrosis: No Has patient had a PCN reaction that required hospitalization No Has patient had a PCN reaction occurring within the last 10 years: No If all of the above answers are "NO", then may proceed with Cephalosporin use.    Zithromax [Azithromycin] Rash    z-pack = rash    Anastrozole Other (See Comments)    Extreme sweating   Cefuroxime Axetil Other (See Comments)    unknown   Cefuroxime Axetil Other (See Comments)   Codeine Other (See Comments)    unknown   Erythromycin Other (See Comments)    unknown   Nitrofurantoin Other (See Comments)    unknown   Ofloxacin Other (See Comments)    unknown   Propoxyphene Other (See Comments)   Propoxyphene N-Acetaminophen Other (See Comments)    unknown   Sulfonamide Derivatives Other (See Comments)    unknown   Tetracycline Other (See Comments)    unknown   Sulfa Antibiotics Other (See Comments) and Rash     Past Medical History:  Diagnosis Date   Anxiety    Asthma    Breast cancer ([place]) 02/07/2016   left breast invasive lobular carcinoma   CAD (coronary artery disease)    mild CAD by cath in 2001   Depression    Diverticulosis    Heart murmur    HTN (hypertension)    Hyperlipidemia    Palpitations    Personal history of radiation therapy 2018   left breast ca   Squamous cell carcinoma of skin 12/09/2017   R lat base of neck, SCCIS   Syncope    Pre-syncope     Past Surgical History:  Procedure Laterality Date   ABDOMINAL HYSTERECTOMY     BREAST BIOPSY Left 02/07/2016   grade II invasive and in situ mammary carcinoma   BREAST CYST ASPIRATION Left 02/13/2016  cyst   BREAST LUMPECTOMY Left 03/03/2016   invasive lobular carcinoma, clear margins, METASTATIC LOBULAR CARCINOMA, 2.5 MM, IN ONE LYMPH NODE (1/1).    CHOLECYSTECTOMY     KNEE ARTHROSCOPY Right    PARTIAL MASTECTOMY WITH NEEDLE LOCALIZATION Left 03/03/2016   Procedure: PARTIAL MASTECTOMY WITH NEEDLE LOCALIZATION;  Surgeon: Leonie Green, MD;  Location: [facility] ORS;  Service: General;  Laterality: Left;   SENTINEL NODE BIOPSY Left 03/03/2016   Procedure: SENTINEL NODE BIOPSY;  Surgeon: Leonie Green, MD;  Location: [facility] ORS;  Service: General;  Laterality: Left;   TUBAL LIGATION      Social History   Socioeconomic  History   Marital status: Married    Spouse name: Not on file   Number of children: Not on file   Years of education: Not on file   Highest education level: Not on file  Occupational History   Not on file  Tobacco Use   Smoking status: Never   Smokeless tobacco: Never  Substance and Sexual Activity   Alcohol use: No    Alcohol/week: 0.0 standard drinks   Drug use: No   Sexual activity: Not on file  Other Topics Concern   Not on file  Social History Narrative   Not on file   Social Determinants of Health   Financial Resource Strain: Not on file  Food Insecurity: Not on file  Transportation Needs: Not on file  Physical Activity: Not on file  Stress: Not on file  Social Connections: Not on file  Intimate Partner Violence: Not on file    Family History  Problem Relation Age of Onset   Cancer Father        oral cancer   Cancer Sister        colon cancer and lymphoma     Current Outpatient Medications:    acetaminophen (TYLENOL) 500 MG tablet, Take 1,000 mg by mouth every 6 (six) hours as needed for mild pain., Disp: , Rfl:    ALPRAZolam (XANAX) 0.5 MG tablet, Take 0.5 mg by mouth at bedtime as needed for anxiety., Disp: , Rfl:    amLODipine (NORVASC) 5 MG tablet, Take 5 mg by mouth daily., Disp: , Rfl:    busPIRone (BUSPAR) 15 MG tablet, Take 15 mg by mouth daily. (Patient not taking: Reported on 09/20/2020), Disp: , Rfl:    Calcium Carb-Cholecalciferol 500-400 MG-UNIT CHEW, Chew 1,200 Units by mouth 2 (two) times daily., Disp: , Rfl:    citalopram (CELEXA) 20 MG tablet, Take 20 mg by mouth daily., Disp: , Rfl:    clarithromycin (BIAXIN) 500 MG tablet, Take 500 mg by mouth 2 (two) times daily. (Patient not taking: Reported on 09/20/2020), Disp: , Rfl:    diclofenac (VOLTAREN) 50 MG EC tablet, Take 1 tablet (50 mg total) by mouth daily., Disp: 90 tablet, Rfl: 1   exemestane (AROMASIN) 25 MG tablet, TAKE 1 TABLET BY MOUTH ONCE A DAY AFTER BREAKFAST., Disp: 90 tablet, Rfl: 1    fluticasone (FLONASE) 50 MCG/ACT nasal spray, Place 2 sprays into both nostrils daily., Disp: , Rfl:    hydrochlorothiazide (HYDRODIURIL) 25 MG tablet, Take 25 mg by mouth daily., Disp: , Rfl:    montelukast (SINGULAIR) 10 MG tablet, Take 10 mg by mouth at bedtime., Disp: , Rfl:    nystatin (MYCOSTATIN/NYSTOP) powder, Apply 1 application topically 3 (three) times daily., Disp: 60 g, Rfl: 0   rosuvastatin (CRESTOR) 10 MG tablet, Take 1 tablet by mouth daily., Disp: , Rfl:  Physical exam:  Vitals:   01/06/21 1041  BP: (!) 134/55  Pulse: (!) 57  Resp: 18  Temp: (!) 96.9 F (36.1 C)  TempSrc: Tympanic  SpO2: 100%  Weight: 184 lb (83.5 kg)   Physical Exam Constitutional:      General: She is not in acute distress. Cardiovascular:     Rate and Rhythm: Normal rate and regular rhythm.     Heart sounds: Normal heart sounds.  Pulmonary:     Effort: Pulmonary effort is normal.     Breath sounds: Normal breath sounds.  Abdominal:     General: Bowel sounds are normal.     Palpations: Abdomen is soft.  Skin:    General: Skin is warm and dry.  Neurological:     Mental Status: She is alert and oriented to person, place, and time.   Breast exam was performed in seated and lying down position. Patient is status post left lumpectomy with a well-healed surgical scar. No evidence of any palpable masses. No evidence of axillary adenopathy. No evidence of any palpable masses or lumps in the right breast. No evidence of right axillary adenopathy   CMP Latest Ref Rng & Units 06/07/2020  Glucose 70 - 99 mg/dL 151(H)  BUN 8 - 23 mg/dL 18  Creatinine 0.44 - 1.00 mg/dL 0.97  Sodium 135 - 145 mmol/L 138  Potassium 3.5 - 5.1 mmol/L 4.4  Chloride 98 - 111 mmol/L 99  CO2 22 - 32 mmol/L 27  Calcium 8.9 - 10.3 mg/dL 9.4  Total Protein 6.5 - 8.1 g/dL 7.9  Total Bilirubin 0.3 - 1.2 mg/dL 0.9  Alkaline Phos 38 - 126 U/L 93  AST 15 - 41 U/L 32  ALT 0 - 44 U/L 32   CBC Latest Ref Rng & Units 06/07/2020   WBC 4.0 - 10.5 K/uL 4.4  Hemoglobin 12.0 - 15.0 g/dL 12.9  Hematocrit 36.0 - 46.0 % 39.3  Platelets 150 - 400 K/uL 240     Assessment and plan- Patient is a 75 y.o. female with h/o Stage IIb pT2N1a(sn)cM0 invasive lobular carcinoma of the left breast ER 10% positive, PR 10% positive, HER-2/neu negative status post lumpectomy/SLND.  She is here for routine follow-up of breast cancer on Aromasin  Clinically patient is doing well with no concerning signs and symptoms of recurrence based on today's exam.  Patient started taking endocrine therapy sometime in April  2018 and will continue taking it until April 2023.  She would be due for repeat mammogram in November 2022 which will be coordinated by Dr. Lysle Pearl.  I will see her back in 6 months no labs recent bone density scan in January 2022 showed osteopeniaWith a T score of -1.5 in her right femur.  She does not require bisphosphonates at this time  Anxiety: On BuSpar and Celexa being managed by primary care Dr. Brigitte Pulse   Visit Diagnosis 1. Use of exemestane (Aromasin)   2. Encounter for follow-up surveillance of breast cancer      Dr. Randa Evens, MD, MPH [facility] [phone] 01/06/2021 11:52 AM

## 2021-01-06 NOTE — Progress Notes (Signed)
Patient here for oncology follow-up appointment, concerns of hot flashes and dizziness

## 2021-01-08 ENCOUNTER — Ambulatory Visit: Admit: 2021-01-08 | Payer: PPO | Admitting: Internal Medicine

## 2021-01-08 SURGERY — COLONOSCOPY
Anesthesia: General

## 2021-01-22 ENCOUNTER — Other Ambulatory Visit: Payer: Self-pay

## 2021-01-22 ENCOUNTER — Encounter: Payer: Self-pay | Admitting: Dermatology

## 2021-01-22 ENCOUNTER — Ambulatory Visit (INDEPENDENT_AMBULATORY_CARE_PROVIDER_SITE_OTHER): Payer: PPO | Admitting: Dermatology

## 2021-01-22 DIAGNOSIS — L578 Other skin changes due to chronic exposure to nonionizing radiation: Secondary | ICD-10-CM | POA: Diagnosis not present

## 2021-01-22 DIAGNOSIS — L720 Epidermal cyst: Secondary | ICD-10-CM | POA: Diagnosis not present

## 2021-01-22 DIAGNOSIS — Z872 Personal history of diseases of the skin and subcutaneous tissue: Secondary | ICD-10-CM

## 2021-01-22 NOTE — Progress Notes (Signed)
   Follow-Up Visit   Subjective  Jamie Curry is a 75 y.o. female who presents for the following: Actinic Keratosis (4 month follow up of left dorsum nose and left nose treated with LN2).  The following portions of the chart were reviewed this encounter and updated as appropriate:   Tobacco  Allergies  Meds  Problems  Med Hx  Surg Hx  Fam Hx     Review of Systems:  No other skin or systemic complaints except as noted in HPI or Assessment and Plan.  Objective  Well appearing patient in no apparent distress; mood and affect are within normal limits.  A focused examination was performed including face. Relevant physical exam findings are noted in the Assessment and Plan.  Nose Clear today  Head - Anterior (Face) Smooth white papule(s).    Assessment & Plan   Actinic Damage - chronic, secondary to cumulative UV radiation exposure/sun exposure over time - diffuse scaly erythematous macules with underlying dyspigmentation - Recommend daily broad spectrum sunscreen SPF 30+ to sun-exposed areas, reapply every 2 hours as needed.  - Recommend staying in the shade or wearing long sleeves, sun glasses (UVA+UVB protection) and wide brim hats (4-inch brim around the entire circumference of the hat). - Call for new or changing lesions.  History of actinic keratoses Nose Clear. Observe for recurrence. Call clinic for new or changing lesions.  Recommend regular skin exams, daily broad-spectrum spf 30+ sunscreen use, and photoprotection.    Milia Head - Anterior (Face) Benign, observe.   Return in about 1 year (around 01/22/2022).  I, Ashok Cordia, CMA, am acting as scribe for Sarina Ser, MD . Documentation: I have reviewed the above documentation for accuracy and completeness, and I agree with the above.  Sarina Ser, MD

## 2021-01-22 NOTE — Patient Instructions (Signed)

## 2021-02-10 DIAGNOSIS — E7849 Other hyperlipidemia: Secondary | ICD-10-CM | POA: Diagnosis not present

## 2021-02-10 DIAGNOSIS — I2510 Atherosclerotic heart disease of native coronary artery without angina pectoris: Secondary | ICD-10-CM | POA: Diagnosis not present

## 2021-02-10 DIAGNOSIS — I10 Essential (primary) hypertension: Secondary | ICD-10-CM | POA: Diagnosis not present

## 2021-02-12 ENCOUNTER — Other Ambulatory Visit: Payer: Self-pay | Admitting: Internal Medicine

## 2021-02-12 DIAGNOSIS — Z1231 Encounter for screening mammogram for malignant neoplasm of breast: Secondary | ICD-10-CM

## 2021-02-26 DIAGNOSIS — F411 Generalized anxiety disorder: Secondary | ICD-10-CM | POA: Diagnosis not present

## 2021-02-26 DIAGNOSIS — F3341 Major depressive disorder, recurrent, in partial remission: Secondary | ICD-10-CM | POA: Diagnosis not present

## 2021-03-12 ENCOUNTER — Ambulatory Visit
Admission: RE | Admit: 2021-03-12 | Discharge: 2021-03-12 | Disposition: A | Discharge status: Home / Self Care | Payer: PPO | Source: Ambulatory Visit | Attending: Internal Medicine | Admitting: Internal Medicine

## 2021-03-12 ENCOUNTER — Other Ambulatory Visit: Payer: Self-pay

## 2021-03-12 DIAGNOSIS — Z1231 Encounter for screening mammogram for malignant neoplasm of breast: Secondary | ICD-10-CM | POA: Diagnosis not present

## 2021-03-12 DIAGNOSIS — I2510 Atherosclerotic heart disease of native coronary artery without angina pectoris: Secondary | ICD-10-CM | POA: Diagnosis not present

## 2021-03-12 DIAGNOSIS — I10 Essential (primary) hypertension: Secondary | ICD-10-CM | POA: Diagnosis not present

## 2021-03-12 DIAGNOSIS — E7849 Other hyperlipidemia: Secondary | ICD-10-CM | POA: Diagnosis not present

## 2021-03-13 ENCOUNTER — Other Ambulatory Visit: Payer: Self-pay | Admitting: Internal Medicine

## 2021-03-13 DIAGNOSIS — N6489 Other specified disorders of breast: Secondary | ICD-10-CM

## 2021-03-13 DIAGNOSIS — R928 Other abnormal and inconclusive findings on diagnostic imaging of breast: Secondary | ICD-10-CM

## 2021-03-18 ENCOUNTER — Telehealth: Payer: Self-pay | Admitting: *Deleted

## 2021-03-18 NOTE — Telephone Encounter (Addendum)
Patient called wanting Dr Janese Banks to know about her mammogram results and that she has to have more imaging done. Her PCP had ordered it and will get results. She is awaiting a call for appointment from [facility]. Her follow up with Dr Janese Banks is not until March 2023 and she is on Exemestane.  CLINICAL DATA:  Screening.   EXAM: DIGITAL SCREENING BILATERAL MAMMOGRAM WITH TOMOSYNTHESIS AND CAD   TECHNIQUE: Bilateral screening digital craniocaudal and mediolateral oblique mammograms were obtained. Bilateral screening digital breast tomosynthesis was performed. The images were evaluated with computer-aided detection.   COMPARISON:  Previous exam(s).   ACR Breast Density Category b: There are scattered areas of fibroglandular density.   FINDINGS: In the left breast, a possible asymmetry warrants further evaluation. In the right breast, no findings suspicious for malignancy.   IMPRESSION: Further evaluation is suggested for possible asymmetry in the left breast.   RECOMMENDATION: Diagnostic mammogram and possibly ultrasound of the left breast. (Code:[id number])   The patient will be contacted regarding the findings, and additional imaging will be scheduled.   BI-RADS CATEGORY  0: Incomplete. Need additional imaging evaluation and/or prior mammograms for comparison.     Electronically Signed   By: Dorise Bullion III M.D.   On: 03/12/2021 12:23

## 2021-03-19 NOTE — Telephone Encounter (Signed)
I will see her sooner if subsequent mammo/ biopsy shows abnormal findings

## 2021-03-20 NOTE — Telephone Encounter (Signed)
I called [facility] and spoke to [place]. She states that she had a mammogram screening and they saw something and Jamie Curry should be calling her and setting her up for a u/s and diagnostic mammogram. No orders are needed

## 2021-03-31 ENCOUNTER — Ambulatory Visit
Admission: RE | Admit: 2021-03-31 | Discharge: 2021-03-31 | Disposition: A | Discharge status: Home / Self Care | Payer: PPO | Source: Ambulatory Visit | Attending: Internal Medicine | Admitting: Internal Medicine

## 2021-03-31 ENCOUNTER — Other Ambulatory Visit: Payer: Self-pay

## 2021-03-31 DIAGNOSIS — R928 Other abnormal and inconclusive findings on diagnostic imaging of breast: Secondary | ICD-10-CM | POA: Diagnosis not present

## 2021-03-31 DIAGNOSIS — R922 Inconclusive mammogram: Secondary | ICD-10-CM | POA: Diagnosis not present

## 2021-03-31 DIAGNOSIS — N6489 Other specified disorders of breast: Secondary | ICD-10-CM

## 2021-04-11 DIAGNOSIS — I10 Essential (primary) hypertension: Secondary | ICD-10-CM | POA: Diagnosis not present

## 2021-04-11 DIAGNOSIS — E7849 Other hyperlipidemia: Secondary | ICD-10-CM | POA: Diagnosis not present

## 2021-04-11 DIAGNOSIS — I2510 Atherosclerotic heart disease of native coronary artery without angina pectoris: Secondary | ICD-10-CM | POA: Diagnosis not present

## 2021-05-05 ENCOUNTER — Other Ambulatory Visit: Payer: Self-pay | Admitting: Nurse Practitioner

## 2021-05-11 DIAGNOSIS — E7849 Other hyperlipidemia: Secondary | ICD-10-CM | POA: Diagnosis not present

## 2021-05-11 DIAGNOSIS — I10 Essential (primary) hypertension: Secondary | ICD-10-CM | POA: Diagnosis not present

## 2021-05-11 DIAGNOSIS — I2510 Atherosclerotic heart disease of native coronary artery without angina pectoris: Secondary | ICD-10-CM | POA: Diagnosis not present

## 2021-05-15 DIAGNOSIS — H6123 Impacted cerumen, bilateral: Secondary | ICD-10-CM | POA: Diagnosis not present

## 2021-05-15 DIAGNOSIS — H902 Conductive hearing loss, unspecified: Secondary | ICD-10-CM | POA: Diagnosis not present

## 2021-06-17 ENCOUNTER — Other Ambulatory Visit: Payer: Self-pay | Admitting: Oncology

## 2021-06-17 ENCOUNTER — Other Ambulatory Visit: Payer: Self-pay | Admitting: Orthopaedic Surgery

## 2021-06-19 ENCOUNTER — Other Ambulatory Visit: Payer: Self-pay | Admitting: Oncology

## 2021-06-19 ENCOUNTER — Telehealth: Payer: Self-pay | Admitting: *Deleted

## 2021-06-19 MED ORDER — EXEMESTANE 25 MG PO TABS: ORAL_TABLET | ORAL | 0 refills | Status: DC

## 2021-06-19 NOTE — Telephone Encounter (Signed)
1 month prescription sent ?

## 2021-06-19 NOTE — Telephone Encounter (Signed)
Call to patient and informed that 30 day prescription has been sent to [facility] ?

## 2021-06-19 NOTE — Telephone Encounter (Signed)
Patient has 4 Exemestane tabs on hand, her next appointment with Dr Janese Banks is 07/07/21, per Office note she completes her Exemestane therapy in April 2023. Does she need a refill now or does she stop it. Please advise ?

## 2021-07-07 ENCOUNTER — Other Ambulatory Visit: Payer: Self-pay

## 2021-07-07 ENCOUNTER — Telehealth: Payer: PPO | Admitting: Oncology

## 2021-07-07 ENCOUNTER — Inpatient Hospital Stay: Payer: PPO | Attending: Oncology | Admitting: Oncology

## 2021-07-07 VITALS — BP 139/69 | HR 63 | Temp 97.3°F | Resp 18 | Ht 64.0 in | Wt 186.0 lb

## 2021-07-07 DIAGNOSIS — C50212 Malignant neoplasm of upper-inner quadrant of left female breast: Secondary | ICD-10-CM | POA: Diagnosis not present

## 2021-07-07 DIAGNOSIS — Z853 Personal history of malignant neoplasm of breast: Secondary | ICD-10-CM | POA: Diagnosis not present

## 2021-07-07 DIAGNOSIS — Z79811 Long term (current) use of aromatase inhibitors: Secondary | ICD-10-CM | POA: Diagnosis not present

## 2021-07-07 DIAGNOSIS — Z08 Encounter for follow-up examination after completed treatment for malignant neoplasm: Secondary | ICD-10-CM

## 2021-07-07 DIAGNOSIS — Z9012 Acquired absence of left breast and nipple: Secondary | ICD-10-CM | POA: Insufficient documentation

## 2021-07-07 DIAGNOSIS — Z170 Estrogen receptor positive status [ER+]: Secondary | ICD-10-CM | POA: Insufficient documentation

## 2021-07-07 NOTE — Progress Notes (Signed)
pt states that she has noticed a rash under her stomach and has been using nystatin and will ike to know if she is okay doing that?  ?

## 2021-07-07 NOTE — Progress Notes (Signed)
? ? ? ?Hematology/Oncology Consult note ?[place]  ?[phone] [phone] ? ?Patient Care Team: ?Ginger Organ., MD as PCP - General (Internal Medicine)  ? ?Name of the patient: Jamie Curry  ?[id number]  ?May 15, 1945  ? ?Date of visit: 07/07/21 ? ?Diagnosis- Stage IIb pT2N1a(sn)cM0 invasive lobular carcinoma of the left breast ER 10% positive, PR 10% positive, HER-2/neu negative status post lumpectomy/SLND ? ?Chief complaint/ Reason for visit- routine f/u of breast cancer ? ?Heme/Onc history:  ?Oncology History  ?Breast cancer of upper-outer quadrant of left female breast ([place])  ?02/15/2008 Imaging  ? Small  probable mildly complicated cysts within the upper outer quadrant right breast at the 11 o'clock position in the region of questionable nodularity noted on mammography.  Recommend follow-up right breast diagnostic mammogram and ultrasound in 6 months. ?  ? ?  ?08/13/2008 Imaging  ? Ultrasound is performed, showing stable small minimal complicated cysts at the right breast 11 o'clock position 4 cm from the nipple, not changed compared prior exam ?  ?09/23/2010 Imaging  ? 1.3 cm simple cyst located within the left breast at the 12 o'clock position corresponding to the mammographic finding.  No findings worrisome for malignancy.  Recommend annual screening mammography ?  ?09/23/2010 Mammogram  ? 1.3 cm simple cyst located within the left breast at the 12 o'clock position corresponding to the mammographic finding.  No findings worrisome for malignancy.  ?  ?02/05/2015 Imaging  ? A 2.5 x 3.5 cm oval circumscribed mass within the upper retroareolar left breast is identified. No suspicious calcifications or distortion identified.   ?On physical exam, a firm palpable mobile mass is identified at the 11:30 position of the left breast 4 cm from the nipple. Targeted ultrasound is performed, showing a 2.8 x 1.4 x 3.6 cm simple cyst at the 11:30 position of the left breast,  corresponding to the screening study finding. ?  ?02/07/2016 Imaging  ? Targeted ultrasound of the left breast was performed demonstrating an irregular shadowing hypoechoic mass at 1 o'clock 5 cm from nipple ?measuring approximately 2.3 x 1 x 1.6 cm. This corresponds well with mammography findings. No lymphadenopathy seen in the left axilla. ?  ?02/07/2016 Mammogram  ? Note that the patient has a stable oval circumscribed mass in the retroareolar left breast previously characterized as a cyst. Although this cyst appears stable when compared to 2016 it is overall increasing in size and therefore given the patient's highly suspicious mass in the upper-outer left breast aspiration of the retroareolar left breast cyst is warranted. ?  ?02/07/2016 Procedure  ? She had US guided biopsy of breast mass ?  ?02/07/2016 Pathology Results  ? Accession: [id number]: Biopsy confirmed invasive breast cancer, ER 10% positive, PR 10% positive and Her2/neu negative, Ki 67 15%  ?Additional molecular study with MammaPrint result came back low risk (10% risk of recurrence in 10 years) ?  ?02/27/2016 Imaging  ? MR breast showed area of known malignancy in the upper-outer quadrant of the left breast, measuring maximum of 4.0 cm (anterior-posterior) an ?associated clip artifact. No findings to indicate multicentric, multifocal, or contralateral malignancy. No MRI evidence for adenopathy ?  ?03/03/2016 Surgery  ? She underwent left partial mastectomy and sentinel lymph node biopsy ?  ?03/03/2016 Pathology Results  ? CASE: [id number] Invasive lobular carcinoma 2.3 cm, Histologic Grade (Nottingham Histologic Score) Glandular (Acinar)/Tubular Differentiation: Score 3 Nuclear Pleomorphism: Score 2 Mitotic Rate: Score 1 Total score: 6 Overall Grade: Grade 2 Tumor Size: 23  mm. Size of lymph node invasion is 2.5 mm ?  ? ? ? ?Interval history-reports doing well overall and tolerating Aromasin without any significant side effects. ? ?ECOG PS-  1 ?Pain scale- 0 ? ? ?Review of systems- Review of Systems  ?Constitutional:  Negative for chills, fever, malaise/fatigue and weight loss.  ?HENT:  Negative for congestion, ear discharge and nosebleeds.   ?Eyes:  Negative for blurred vision.  ?Respiratory:  Negative for cough, hemoptysis, sputum production, shortness of breath and wheezing.   ?Cardiovascular:  Negative for chest pain, palpitations, orthopnea and claudication.  ?Gastrointestinal:  Negative for abdominal pain, blood in stool, constipation, diarrhea, heartburn, melena, nausea and vomiting.  ?Genitourinary:  Negative for dysuria, flank pain, frequency, hematuria and urgency.  ?Musculoskeletal:  Negative for back pain, joint pain and myalgias.  ?Skin:  Negative for rash.  ?Neurological:  Negative for dizziness, tingling, focal weakness, seizures, weakness and headaches.  ?Endo/Heme/Allergies:  Does not bruise/bleed easily.  ?Psychiatric/Behavioral:  Negative for depression and suicidal ideas. The patient does not have insomnia.    ? ? ? ?Allergies  ?Allergen Reactions  ? Penicillins Anaphylaxis  ?  REACTION: severe anaphylaxis ?Has patient had a PCN reaction causing immediate rash, facial/tongue/throat swelling, SOB or lightheadedness with hypotension: Yes ?Has patient had a PCN reaction causing severe rash involving mucus membranes or skin necrosis: No ?Has patient had a PCN reaction that required hospitalization No ?Has patient had a PCN reaction occurring within the last 10 years: No ?If all of the above answers are "NO", then may proceed with Cephalosporin use. ?  ? Zithromax [Azithromycin] Rash  ?  z-pack = rash  ? Anastrozole Other (See Comments)  ?  Extreme sweating  ? Cefuroxime Axetil Other (See Comments)  ?  unknown  ? Cefuroxime Axetil Other (See Comments)  ? Codeine Other (See Comments)  ?  unknown  ? Erythromycin Other (See Comments)  ?  unknown  ? Nitrofurantoin Other (See Comments)  ?  unknown  ? Ofloxacin Other (See Comments)  ?  unknown   ? Propoxyphene Other (See Comments)  ? Propoxyphene N-Acetaminophen Other (See Comments)  ?  unknown  ? Sulfonamide Derivatives Other (See Comments)  ?  unknown  ? Tetracycline Other (See Comments)  ?  unknown  ? Sulfa Antibiotics Other (See Comments) and Rash  ? ? ? ?Past Medical History:  ?Diagnosis Date  ? Anxiety   ? Asthma   ? Breast cancer ([place]) 02/07/2016  ? left breast invasive lobular carcinoma  ? CAD (coronary artery disease)   ? mild CAD by cath in 2001  ? Depression   ? Diverticulosis   ? Heart murmur   ? HTN (hypertension)   ? Hyperlipidemia   ? Palpitations   ? Personal history of radiation therapy 2018  ? left breast ca  ? Squamous cell carcinoma of skin 12/09/2017  ? R lat base of neck, SCCIS  ? Syncope   ? Pre-syncope  ? ? ? ?Past Surgical History:  ?Procedure Laterality Date  ? ABDOMINAL HYSTERECTOMY    ? BREAST BIOPSY Left 02/07/2016  ? grade II invasive and in situ mammary carcinoma  ? BREAST CYST ASPIRATION Left 02/13/2016  ? cyst  ? BREAST LUMPECTOMY Left 03/03/2016  ? invasive lobular carcinoma, clear margins, METASTATIC LOBULAR CARCINOMA, 2.5 MM, IN ONE LYMPH NODE (1/1).   ? CHOLECYSTECTOMY    ? KNEE ARTHROSCOPY Right   ? PARTIAL MASTECTOMY WITH NEEDLE LOCALIZATION Left 03/03/2016  ? Procedure: PARTIAL MASTECTOMY WITH NEEDLE  LOCALIZATION;  Surgeon: Leonie Green, MD;  Location: [facility] ORS;  Service: General;  Laterality: Left;  ? SENTINEL NODE BIOPSY Left 03/03/2016  ? Procedure: SENTINEL NODE BIOPSY;  Surgeon: Leonie Green, MD;  Location: [facility] ORS;  Service: General;  Laterality: Left;  ? TUBAL LIGATION    ? ? ?Social History  ? ?Socioeconomic History  ? Marital status: Married  ?  Spouse name: Not on file  ? Number of children: Not on file  ? Years of education: Not on file  ? Highest education level: Not on file  ?Occupational History  ? Not on file  ?Tobacco Use  ? Smoking status: Never  ? Smokeless tobacco: Never  ?Substance and Sexual Activity  ? Alcohol use: No  ?   Alcohol/week: 0.0 standard drinks  ? Drug use: No  ? Sexual activity: Not on file  ?Other Topics Concern  ? Not on file  ?Social History Narrative  ? Not on file  ? ?Social Determinants of Health  ? ?Financial Resourc

## 2021-07-11 DIAGNOSIS — I10 Essential (primary) hypertension: Secondary | ICD-10-CM | POA: Diagnosis not present

## 2021-07-11 DIAGNOSIS — I2510 Atherosclerotic heart disease of native coronary artery without angina pectoris: Secondary | ICD-10-CM | POA: Diagnosis not present

## 2021-07-11 DIAGNOSIS — E7849 Other hyperlipidemia: Secondary | ICD-10-CM | POA: Diagnosis not present

## 2021-07-28 ENCOUNTER — Telehealth: Payer: Self-pay

## 2021-07-28 NOTE — Telephone Encounter (Signed)
Patient called stating that it is getting hard for her to keep going to her PCP in [place] and her sister Jamie Curry, use to see Dr Damita Dunnings and her brother in law Altamease Oiler sees Dr Damita Dunnings and speaks high of him. Patient was advised that Dr Damita Dunnings is not taking new patients but patient wanted to me to ask please. Patient said she would really appreciate it if Dr Damita Dunnings could squeeze her in. Please let her know either way thank you. ?

## 2021-07-30 NOTE — Telephone Encounter (Signed)
Yes, glad to see patient.  Please set up when possible in the next month or so.  Please make sure there is not any urgent issue that needs to be addressed in the meantime.  Thanks. ?

## 2021-07-30 NOTE — Telephone Encounter (Signed)
Called patient and schedule appt for TOC for 09/01/21 at 11:00 am. Patient does not have any urgent issues to address.  ?

## 2021-09-01 ENCOUNTER — Ambulatory Visit (INDEPENDENT_AMBULATORY_CARE_PROVIDER_SITE_OTHER): Payer: PPO | Admitting: Family Medicine

## 2021-09-01 ENCOUNTER — Encounter: Payer: Self-pay | Admitting: Family Medicine

## 2021-09-01 ENCOUNTER — Ambulatory Visit (INDEPENDENT_AMBULATORY_CARE_PROVIDER_SITE_OTHER)
Admission: RE | Admit: 2021-09-01 | Discharge: 2021-09-01 | Disposition: A | Discharge status: Home / Self Care | Payer: PPO | Source: Ambulatory Visit | Attending: Family Medicine | Admitting: Family Medicine

## 2021-09-01 VITALS — BP 122/60 | HR 76 | Temp 97.3°F | Ht 63.5 in | Wt 182.0 lb

## 2021-09-01 DIAGNOSIS — I10 Essential (primary) hypertension: Secondary | ICD-10-CM | POA: Diagnosis not present

## 2021-09-01 DIAGNOSIS — J45909 Unspecified asthma, uncomplicated: Secondary | ICD-10-CM | POA: Diagnosis not present

## 2021-09-01 DIAGNOSIS — Z7189 Other specified counseling: Secondary | ICD-10-CM | POA: Diagnosis not present

## 2021-09-01 DIAGNOSIS — M25512 Pain in left shoulder: Secondary | ICD-10-CM

## 2021-09-01 DIAGNOSIS — C50412 Malignant neoplasm of upper-outer quadrant of left female breast: Secondary | ICD-10-CM | POA: Diagnosis not present

## 2021-09-01 DIAGNOSIS — M19012 Primary osteoarthritis, left shoulder: Secondary | ICD-10-CM | POA: Diagnosis not present

## 2021-09-01 DIAGNOSIS — F419 Anxiety disorder, unspecified: Secondary | ICD-10-CM | POA: Diagnosis not present

## 2021-09-01 DIAGNOSIS — E785 Hyperlipidemia, unspecified: Secondary | ICD-10-CM | POA: Diagnosis not present

## 2021-09-01 LAB — CBC WITH DIFFERENTIAL/PLATELET
Basophils Absolute: 0.1 10*3/uL (ref 0.0–0.1)
Basophils Relative: 1.1 % (ref 0.0–3.0)
Eosinophils Absolute: 0.2 10*3/uL (ref 0.0–0.7)
Eosinophils Relative: 3.9 % (ref 0.0–5.0)
HCT: 37.7 % (ref 36.0–46.0)
Hemoglobin: 12.6 g/dL (ref 12.0–15.0)
Lymphocytes Relative: 22.1 % (ref 12.0–46.0)
Lymphs Abs: 1 10*3/uL (ref 0.7–4.0)
MCHC: 33.4 g/dL (ref 30.0–36.0)
MCV: 91.5 fl (ref 78.0–100.0)
Monocytes Absolute: 0.5 10*3/uL (ref 0.1–1.0)
Monocytes Relative: 11.3 % (ref 3.0–12.0)
Neutro Abs: 2.9 10*3/uL (ref 1.4–7.7)
Neutrophils Relative %: 61.6 % (ref 43.0–77.0)
Platelets: 206 10*3/uL (ref 150.0–400.0)
RBC: 4.12 Mil/uL (ref 3.87–5.11)
RDW: 14.3 % (ref 11.5–15.5)
WBC: 4.7 10*3/uL (ref 4.0–10.5)

## 2021-09-01 LAB — LIPID PANEL
Cholesterol: 237 mg/dL — ABNORMAL HIGH (ref 0–200)
HDL: 72.8 mg/dL (ref >39.00)
LDL Cholesterol: 136 mg/dL — ABNORMAL HIGH (ref 0–99)
NonHDL: 163.89
Total CHOL/HDL Ratio: 3
Triglycerides: 141 mg/dL (ref 0.0–149.0)
VLDL: 28.2 mg/dL (ref 0.0–40.0)

## 2021-09-01 LAB — COMPREHENSIVE METABOLIC PANEL
ALT: 58 U/L — ABNORMAL HIGH (ref 0–35)
AST: 34 U/L (ref 0–37)
Albumin: 4.5 g/dL (ref 3.5–5.2)
Alkaline Phosphatase: 153 U/L — ABNORMAL HIGH (ref 39–117)
BUN: 22 mg/dL (ref 6–23)
CO2: 31 mEq/L (ref 19–32)
Calcium: 9.9 mg/dL (ref 8.4–10.5)
Chloride: 97 mEq/L (ref 96–112)
Creatinine, Ser: 0.92 mg/dL (ref 0.40–1.20)
GFR: 60.49 mL/min (ref >60.00)
Glucose, Bld: 92 mg/dL (ref 70–99)
Potassium: 4.4 mEq/L (ref 3.5–5.1)
Sodium: 136 mEq/L (ref 135–145)
Total Bilirubin: 0.8 mg/dL (ref 0.2–1.2)
Total Protein: 7.3 g/dL (ref 6.0–8.3)

## 2021-09-01 LAB — TSH: TSH: 2.83 u[IU]/mL (ref 0.35–5.50)

## 2021-09-01 MED ORDER — ESCITALOPRAM OXALATE 20 MG PO TABS: 20.0000 mg | ORAL_TABLET | Freq: Every day | ORAL | 1 refills | Status: DC

## 2021-09-01 NOTE — Patient Instructions (Signed)
Go to the lab on the way out.   If you have mychart we'll likely use that to update you.    We may need to set up PT in [place] after I see your xray.   I sent your lexapro refill.  Take care.  Glad to see you.

## 2021-09-01 NOTE — Progress Notes (Unsigned)
To est care.   History of breast cancer.  Status post lumpectomy, s/p 5 years aromasin.  Had onc f/u this March.  Mammogram up-to-date.  Hypertension:    Using medication without problems or lightheadedness: yes Chest pain with exertion:no Edema:no Short of breath:no  H/o asthma, doing well on singulair.  No recent SABA use.    Elevated Cholesterol: Using medications without problems:yes Muscle aches: no Diet compliance: d/w pt.   Exercise: d/w pt.    She is seeing Dr. Ninfa Linden about her knees.  S/p injection.  I will defer.  She agrees.  L shoulder pain with ROM.  L shoulder with pain int > ext rotation. Pain overhead movement.  Going on about 1 month.  No specific injury.  Pain sleeping on L side.    Mood d/w pt.  Had been on lexapro with prn use of xanax/qd prn.  Sig PMH (h/o cancer) with anxiety/depression related to that.  Separated and divorced now.  Safe at home.  Brother and sister both died right after she had cancer dx.  D/w pt about taking 1/2 tab of xanax BID in the meantime.  She has more jitteriness at midday and in the evening.  No SI/HI.  She had anxiety episode 08/13/21 with EKG done [place] fire station.  EKG reviewed and scanned.  Brother in law Hassie Bruce designated if patient were incapacitated.    Meds, vitals, and allergies reviewed.   ROS: Per HPI unless specifically indicated in ROS section   GEN: nad, alert and oriented HEENT: ncat NECK: supple w/o LA CV: rrr.  Faint systolic murmur noted. PULM: ctab, no inc wob ABD: soft, +bs EXT: no edema SKIN: no acute rash L shoulder with pain with overhead movement.  No arm drop.  L shoulder with pain int > ext rotation.  Distally neurovascular intact.  40 minutes were devoted to patient care in this encounter (this includes time spent reviewing the patient's file/history, interviewing and examining the patient, counseling/reviewing plan with patient).

## 2021-09-03 ENCOUNTER — Ambulatory Visit (INDEPENDENT_AMBULATORY_CARE_PROVIDER_SITE_OTHER): Payer: PPO | Admitting: Dermatology

## 2021-09-03 ENCOUNTER — Telehealth: Payer: Self-pay | Admitting: Family Medicine

## 2021-09-03 DIAGNOSIS — M25512 Pain in left shoulder: Secondary | ICD-10-CM | POA: Insufficient documentation

## 2021-09-03 DIAGNOSIS — J45909 Unspecified asthma, uncomplicated: Secondary | ICD-10-CM | POA: Insufficient documentation

## 2021-09-03 DIAGNOSIS — L570 Actinic keratosis: Secondary | ICD-10-CM

## 2021-09-03 DIAGNOSIS — R011 Cardiac murmur, unspecified: Secondary | ICD-10-CM

## 2021-09-03 DIAGNOSIS — L578 Other skin changes due to chronic exposure to nonionizing radiation: Secondary | ICD-10-CM

## 2021-09-03 DIAGNOSIS — Z7189 Other specified counseling: Secondary | ICD-10-CM | POA: Insufficient documentation

## 2021-09-03 DIAGNOSIS — F419 Anxiety disorder, unspecified: Secondary | ICD-10-CM | POA: Insufficient documentation

## 2021-09-03 DIAGNOSIS — L814 Other melanin hyperpigmentation: Secondary | ICD-10-CM | POA: Diagnosis not present

## 2021-09-03 DIAGNOSIS — L820 Inflamed seborrheic keratosis: Secondary | ICD-10-CM

## 2021-09-03 DIAGNOSIS — L720 Epidermal cyst: Secondary | ICD-10-CM

## 2021-09-03 DIAGNOSIS — L821 Other seborrheic keratosis: Secondary | ICD-10-CM | POA: Diagnosis not present

## 2021-09-03 DIAGNOSIS — R7989 Other specified abnormal findings of blood chemistry: Secondary | ICD-10-CM

## 2021-09-03 MED ORDER — MONTELUKAST SODIUM 10 MG PO TABS: 10.0000 mg | ORAL_TABLET | Freq: Every day | ORAL | 1 refills | Status: DC

## 2021-09-03 NOTE — Patient Instructions (Addendum)

## 2021-09-03 NOTE — Assessment & Plan Note (Signed)
Mammogram up-to-date, per oncology.

## 2021-09-03 NOTE — Addendum Note (Signed)
Addended by: Tonia Ghent on: 09/03/2021 03:47 PM   Modules accepted: Orders

## 2021-09-03 NOTE — Assessment & Plan Note (Addendum)
Continue hydrochlorothiazide and amlodipine.  See notes on labs.

## 2021-09-03 NOTE — Telephone Encounter (Signed)
Pt called and requested call back about labs and xray once its resulted at [phone] because she doesn't use mychart. She also mentioned she needed a refill on Singulair, she needs sent to [facility] phone number [phone]

## 2021-09-03 NOTE — Assessment & Plan Note (Signed)
Likely rotator cuff irritation.  Discussed checking plain films and potentially setting up physical therapy in [place].  See notes on imaging.

## 2021-09-03 NOTE — Assessment & Plan Note (Signed)
Reports doing well on Singulair without adverse effect on medication.  No recent albuterol use or need.  Continue Singulair.  She will update me as needed.

## 2021-09-03 NOTE — Telephone Encounter (Signed)
Call patient.  Several issues  She has mild shoulder arthritis.  I think it makes sense to try physical therapy in [place] to help with rotator cuff symptoms.  I put in the order for that.  She should get a call about scheduling.  If she does not get better then we can refer her to orthopedics if needed. She has mild liver test elevations and I think it makes sense to get a liver ultrasound done.  She could have fatty deposits in her liver causing these lab abnormalities, especially since her lipids are elevated.  I pended the order below.  Please let me know if she consents and I will sign off. I looked back in her records about her cardiac murmur.  I think it makes sense to get an echo done to evaluate the valves in her heart, just to make sure that is not an issue.  I do not expect this to be a problem but I think it makes sense to get it done.  I pended this order too.  Let me know if she consents.  Thanks.

## 2021-09-03 NOTE — Progress Notes (Signed)
   Follow-Up Visit   Subjective  Jamie Curry is a 76 y.o. female who presents for the following: check nose (Nose, hx of Aks, pt wanted checked). The patient has spots, moles and lesions to be evaluated, some may be new or changing and the patient has concerns that these could be cancer.  The following portions of the chart were reviewed this encounter and updated as appropriate:   Tobacco  Allergies  Meds  Problems  Med Hx  Surg Hx  Fam Hx     Review of Systems:  No other skin or systemic complaints except as noted in HPI or Assessment and Plan.  Objective  Well appearing patient in no apparent distress; mood and affect are within normal limits.  A focused examination was performed including face. Relevant physical exam findings are noted in the Assessment and Plan.  glabella x 1, nose x 1 (2) Pink scaly macules  L arm x 3 (3) Stuck on waxy paps with erythema   Assessment & Plan   Lentigines - Scattered tan macules - Due to sun exposure - Benign-appering, observe - Recommend daily broad spectrum sunscreen SPF 30+ to sun-exposed areas, reapply every 2 hours as needed. - Call for any changes  Seborrheic Keratoses - Stuck-on, waxy, tan-brown papules and/or plaques  - Benign-appearing - Discussed benign etiology and prognosis. - Observe - Call for any changes  Actinic Damage - chronic, secondary to cumulative UV radiation exposure/sun exposure over time - diffuse scaly erythematous macules with underlying dyspigmentation - Recommend daily broad spectrum sunscreen SPF 30+ to sun-exposed areas, reapply every 2 hours as needed.  - Recommend staying in the shade or wearing long sleeves, sun glasses (UVA+UVB protection) and wide brim hats (4-inch brim around the entire circumference of the hat). - Call for new or changing lesions.   Milia - tiny firm white papules - type of cyst - benign - may be extracted if symptomatic - observe   AK (actinic keratosis)  (2) glabella x 1, nose x 1  Destruction of lesion - glabella x 1, nose x 1 Complexity: simple   Destruction method: cryotherapy   Informed consent: discussed and consent obtained   Timeout:  patient name, date of birth, surgical site, and procedure verified Lesion destroyed using liquid nitrogen: Yes   Region frozen until ice ball extended beyond lesion: Yes   Outcome: patient tolerated procedure well with no complications   Post-procedure details: wound care instructions given    Inflamed seborrheic keratosis (3) L arm x 3  Destruction of lesion - L arm x 3 Complexity: simple   Destruction method: cryotherapy   Informed consent: discussed and consent obtained   Timeout:  patient name, date of birth, surgical site, and procedure verified Lesion destroyed using liquid nitrogen: Yes   Region frozen until ice ball extended beyond lesion: Yes   Outcome: patient tolerated procedure well with no complications   Post-procedure details: wound care instructions given     Return for as scheduled.  I, Othelia Pulling, RMA, am acting as scribe for Sarina Ser, MD . Documentation: I have reviewed the above documentation for accuracy and completeness, and I agree with the above.  Sarina Ser, MD

## 2021-09-03 NOTE — Assessment & Plan Note (Signed)
Continue Crestor.  See notes on labs. 

## 2021-09-03 NOTE — Assessment & Plan Note (Signed)
Discussed rationale for Lexapro use as a maintenance medication with symptomatic treatment with alprazolam.  Routine cautions given to patient.  Safe at home.  Still okay for outpatient follow-up.  Would continue Lexapro as is.  D/w pt about taking 1/2 tab of xanax BID in the meantime.  She can let me know if that is helping better.  That is not an increase in dose.  She agrees with plan.

## 2021-09-03 NOTE — Assessment & Plan Note (Signed)
  Brother in law Hassie Bruce designated if patient were incapacitated.

## 2021-09-03 NOTE — Telephone Encounter (Signed)
Erx sent. Please let me know once results are ready for patient.

## 2021-09-04 NOTE — Telephone Encounter (Signed)
Patient notified of lab results and verbalized understanding. Patient is fine with doing everything and will await calls to schedule once orders have been placed.

## 2021-09-08 ENCOUNTER — Encounter: Payer: Self-pay | Admitting: Dermatology

## 2021-09-10 NOTE — Addendum Note (Signed)
Addended by: Tonia Ghent on: 09/10/2021 10:32 AM   Modules accepted: Orders

## 2021-09-10 NOTE — Telephone Encounter (Signed)
Orders signed. Thanks.

## 2021-09-17 ENCOUNTER — Ambulatory Visit (INDEPENDENT_AMBULATORY_CARE_PROVIDER_SITE_OTHER): Payer: PPO

## 2021-09-17 DIAGNOSIS — R011 Cardiac murmur, unspecified: Secondary | ICD-10-CM

## 2021-09-17 LAB — ECHOCARDIOGRAM COMPLETE
Area-P 1/2: 2.79 cm2
S' Lateral: 3.3 cm

## 2021-09-22 ENCOUNTER — Telehealth: Payer: Self-pay

## 2021-09-22 DIAGNOSIS — Z170 Estrogen receptor positive status [ER+]: Secondary | ICD-10-CM

## 2021-09-22 MED ORDER — ALPRAZOLAM 0.5 MG PO TABS: ORAL_TABLET | ORAL | Status: DC

## 2021-09-22 NOTE — Addendum Note (Signed)
Addended by: Tonia Ghent on: 09/22/2021 02:07 PM   Modules accepted: Orders

## 2021-09-22 NOTE — Telephone Encounter (Signed)
If you do not get on home phone please call her cell.

## 2021-09-22 NOTE — Telephone Encounter (Signed)
Called and spoke with patient. She was confused on what she had done. Patient has not had the US done yet; only the Echo. I went over her echo results with her with what Dr. Damita Dunnings put in her mychart. Patient does not use mychart. Went over her medication instructions and patient verbalized understanding. She will call radiology to get scheduled for her [place].

## 2021-09-22 NOTE — Telephone Encounter (Addendum)
I don't see her U/s report.  Please see about getting a copy.   Could try taking xanax 1/2 tab in the AM and 1 tab in the PM if needed.   Please let me know how that goes.  Thanks.

## 2021-09-22 NOTE — Telephone Encounter (Signed)
Patient called wanted to see if you had received results from ultrasound.  Also wanted to let you know that with the lexapro and xanax. She is still having issues with sleeping did you want to have her come in for follow up.

## 2021-09-23 NOTE — Telephone Encounter (Signed)
Noted. Thanks.

## 2021-09-29 ENCOUNTER — Other Ambulatory Visit: Payer: Self-pay | Admitting: Family Medicine

## 2021-10-01 ENCOUNTER — Encounter: Payer: Self-pay | Admitting: *Deleted

## 2021-10-09 ENCOUNTER — Ambulatory Visit
Admission: RE | Admit: 2021-10-09 | Discharge: 2021-10-09 | Disposition: A | Discharge status: Home / Self Care | Payer: PPO | Source: Ambulatory Visit | Attending: Family Medicine | Admitting: Family Medicine

## 2021-10-09 DIAGNOSIS — R7989 Other specified abnormal findings of blood chemistry: Secondary | ICD-10-CM | POA: Insufficient documentation

## 2021-10-09 DIAGNOSIS — Z9049 Acquired absence of other specified parts of digestive tract: Secondary | ICD-10-CM | POA: Diagnosis not present

## 2021-10-09 DIAGNOSIS — R945 Abnormal results of liver function studies: Secondary | ICD-10-CM | POA: Diagnosis not present

## 2021-10-30 ENCOUNTER — Ambulatory Visit (INDEPENDENT_AMBULATORY_CARE_PROVIDER_SITE_OTHER): Payer: PPO | Admitting: Family Medicine

## 2021-10-30 ENCOUNTER — Encounter: Payer: Self-pay | Admitting: Family Medicine

## 2021-10-30 DIAGNOSIS — F419 Anxiety disorder, unspecified: Secondary | ICD-10-CM

## 2021-10-30 MED ORDER — BUSPIRONE HCL 5 MG PO TABS: 5.0000 mg | ORAL_TABLET | Freq: Two times a day (BID) | ORAL | Status: DC

## 2021-10-30 NOTE — Patient Instructions (Addendum)
Try 1/2 tab xanax in the AM and 1 pill later in the day.   Take care.  Glad to see you. If that isn't helping then let me know.  Adding on buspar may help.  Let me know and I can send the rx if needed.

## 2021-10-30 NOTE — Progress Notes (Signed)
Mood follow up.  On lexapro and xanax.  Living at home with her dog (and her dog is ill with cancer).  "I'm a worrier anyway" and she is worried about her dog.  Safe at home but she doesn't have family around now and she is worried about her finances.  No SI/HI.  Xanax helps when used for feeling antsy/anxious.  She has been taking 1/2 tab xanax BID.    Separated and divorced now.  Brother and sister both died right after she had cancer dx.  Meds, vitals, and allergies reviewed.   ROS: Per HPI unless specifically indicated in ROS section   GEN: nad, alert and oriented HEENT: ncat NECK: supple w/o LA CV: rrr. PULM: ctab, no inc wob ABD: soft, +bs EXT: no edema SKIN: well perfused.

## 2021-11-02 NOTE — Assessment & Plan Note (Signed)
Discussed options.  Reasonable to try 1/2 tab xanax in the AM and 1 pill later in the day.    If that isn't helping then she can let me know.  Adding on buspar may help.  She can let me know and I can send the rx if needed.

## 2021-11-13 DIAGNOSIS — H902 Conductive hearing loss, unspecified: Secondary | ICD-10-CM | POA: Diagnosis not present

## 2021-11-13 DIAGNOSIS — R42 Dizziness and giddiness: Secondary | ICD-10-CM | POA: Diagnosis not present

## 2021-11-13 DIAGNOSIS — H6123 Impacted cerumen, bilateral: Secondary | ICD-10-CM | POA: Diagnosis not present

## 2021-11-18 ENCOUNTER — Other Ambulatory Visit: Payer: Self-pay | Admitting: Family Medicine

## 2021-12-01 ENCOUNTER — Telehealth: Payer: Self-pay | Admitting: Family Medicine

## 2021-12-01 DIAGNOSIS — Z659 Problem related to unspecified psychosocial circumstances: Secondary | ICD-10-CM

## 2021-12-01 NOTE — Telephone Encounter (Signed)
Patient called in stating that she is doing a little better with the new dosage of ALPRAZolam (XANAX) 0.5 MG tablet. She also had questions regarding speaking with a therapist with other things she have going on. She can be reached at [phone]. Thank you!

## 2021-12-02 NOTE — Telephone Encounter (Signed)
LMTCB

## 2021-12-02 NOTE — Telephone Encounter (Signed)
Spoke with patient and she would like a referral for therapy. She feels like she will benefit from talking with someone. She is getting ready to have to put her dog down and doesn't feel like she can go thru this alone without having someone to talk to. Would [facility] be able to help or does she need to be referred somewhere else?

## 2021-12-03 NOTE — Addendum Note (Signed)
Addended by: Tonia Ghent on: 12/03/2021 03:02 PM   Modules accepted: Orders

## 2021-12-03 NOTE — Telephone Encounter (Signed)
I put in the referral.  Thanks.  

## 2021-12-03 NOTE — Telephone Encounter (Signed)
Patient has been notified referral was placed.

## 2021-12-04 ENCOUNTER — Telehealth: Payer: Self-pay

## 2021-12-04 NOTE — Telephone Encounter (Signed)
Patient notified

## 2021-12-04 NOTE — Telephone Encounter (Signed)
It shouldn't interfere.  Thanks.

## 2021-12-04 NOTE — Telephone Encounter (Signed)
Patient called back, gave message below. Wants to know if it will interfere with the Lexapro at night.

## 2021-12-04 NOTE — Telephone Encounter (Signed)
Patient is calling in stating she is having a stressful day due to having to put her dog down. Wanting to know if she can take a XANAX now and this evening. If she can how many hours in between does it need to be.

## 2021-12-04 NOTE — Telephone Encounter (Signed)
Please give my condolences. Can take xanax doses 8 hours apart. . Thanks.

## 2021-12-04 NOTE — Telephone Encounter (Signed)
lmtcb

## 2021-12-12 ENCOUNTER — Ambulatory Visit (INDEPENDENT_AMBULATORY_CARE_PROVIDER_SITE_OTHER): Payer: PPO

## 2021-12-12 ENCOUNTER — Other Ambulatory Visit: Payer: Self-pay | Admitting: Family Medicine

## 2021-12-12 VITALS — Ht 63.5 in | Wt 183.0 lb

## 2021-12-12 DIAGNOSIS — Z0000 Encounter for general adult medical examination without abnormal findings: Secondary | ICD-10-CM

## 2021-12-12 DIAGNOSIS — Z170 Estrogen receptor positive status [ER+]: Secondary | ICD-10-CM

## 2021-12-12 NOTE — Patient Instructions (Signed)
Jamie Curry , Thank you for taking time to come for your Medicare Wellness Visit. I appreciate your ongoing commitment to your health goals. Please review the following plan we discussed and let me know if I can assist you in the future.   Screening recommendations/referrals: Colonoscopy: aged out Mammogram: has appt 12/4 Bone Density: 05/07/20 Recommended yearly ophthalmology/optometry visit for glaucoma screening and checkup Recommended yearly dental visit for hygiene and checkup  Vaccinations: Influenza vaccine: n/d Pneumococcal vaccine: 07/25/14 Tdap vaccine: 12/19/09, due if have injury Shingles vaccine: Zostavax 04/27/17   Shingrix 01/26/17, 04/27/17   Covid-19:09/16/19, 10/07/19  Advanced directives: no  Conditions/risks identified: none  Next appointment: Follow up in one year for your annual wellness visit 12/16/22 @ 9 am by phone   Preventive Care 65 Years and Older, Female Preventive care refers to lifestyle choices and visits with your health care provider that can promote health and wellness. What does preventive care include? A yearly physical exam. This is also called an annual well check. Dental exams once or twice a year. Routine eye exams. Ask your health care provider how often you should have your eyes checked. Personal lifestyle choices, including: Daily care of your teeth and gums. Regular physical activity. Eating a healthy diet. Avoiding tobacco and drug use. Limiting alcohol use. Practicing safe sex. Taking low-dose aspirin every day. Taking vitamin and mineral supplements as recommended by your health care provider. What happens during an annual well check? The services and screenings done by your health care provider during your annual well check will depend on your age, overall health, lifestyle risk factors, and family history of disease. Counseling  Your health care provider may ask you questions about your: Alcohol use. Tobacco use. Drug use. Emotional  well-being. Home and relationship well-being. Sexual activity. Eating habits. History of falls. Memory and ability to understand (cognition). Work and work Statistician. Reproductive health. Screening  You may have the following tests or measurements: Height, weight, and BMI. Blood pressure. Lipid and cholesterol levels. These may be checked every 5 years, or more frequently if you are over 54 years old. Skin check. Lung cancer screening. You may have this screening every year starting at age 28 if you have a 30-pack-year history of smoking and currently smoke or have quit within the past 15 years. Fecal occult blood test (FOBT) of the stool. You may have this test every year starting at age 16. Flexible sigmoidoscopy or colonoscopy. You may have a sigmoidoscopy every 5 years or a colonoscopy every 10 years starting at age 31. Hepatitis C blood test. Hepatitis B blood test. Sexually transmitted disease (STD) testing. Diabetes screening. This is done by checking your blood sugar (glucose) after you have not eaten for a while (fasting). You may have this done every 1-3 years. Bone density scan. This is done to screen for osteoporosis. You may have this done starting at age 49. Mammogram. This may be done every 1-2 years. Talk to your health care provider about how often you should have regular mammograms. Talk with your health care provider about your test results, treatment options, and if necessary, the need for more tests. Vaccines  Your health care provider may recommend certain vaccines, such as: Influenza vaccine. This is recommended every year. Tetanus, diphtheria, and acellular pertussis (Tdap, Td) vaccine. You may need a Td booster every 10 years. Zoster vaccine. You may need this after age 29. Pneumococcal 13-valent conjugate (PCV13) vaccine. One dose is recommended after age 93. Pneumococcal polysaccharide (PPSV23) vaccine.  One dose is recommended after age 25. Talk to your  health care provider about which screenings and vaccines you need and how often you need them. This information is not intended to replace advice given to you by your health care provider. Make sure you discuss any questions you have with your health care provider. Document Released: 04/26/2015 Document Revised: 12/18/2015 Document Reviewed: 01/29/2015 Elsevier Interactive Patient Education  2017 [place] Prevention in the Home Falls can cause injuries. They can happen to people of all ages. There are many things you can do to make your home safe and to help prevent falls. What can I do on the outside of my home? Regularly fix the edges of walkways and driveways and fix any cracks. Remove anything that might make you trip as you walk through a door, such as a raised step or threshold. Trim any bushes or trees on the path to your home. Use bright outdoor lighting. Clear any walking paths of anything that might make someone trip, such as rocks or tools. Regularly check to see if handrails are loose or broken. Make sure that both sides of any steps have handrails. Any raised decks and porches should have guardrails on the edges. Have any leaves, snow, or ice cleared regularly. Use sand or salt on walking paths during winter. Clean up any spills in your garage right away. This includes oil or grease spills. What can I do in the bathroom? Use night lights. Install grab bars by the toilet and in the tub and shower. Do not use towel bars as grab bars. Use non-skid mats or decals in the tub or shower. If you need to sit down in the shower, use a plastic, non-slip stool. Keep the floor dry. Clean up any water that spills on the floor as soon as it happens. Remove soap buildup in the tub or shower regularly. Attach bath mats securely with double-sided non-slip rug tape. Do not have throw rugs and other things on the floor that can make you trip. What can I do in the bedroom? Use night  lights. Make sure that you have a light by your bed that is easy to reach. Do not use any sheets or blankets that are too big for your bed. They should not hang down onto the floor. Have a firm chair that has side arms. You can use this for support while you get dressed. Do not have throw rugs and other things on the floor that can make you trip. What can I do in the kitchen? Clean up any spills right away. Avoid walking on wet floors. Keep items that you use a lot in easy-to-reach places. If you need to reach something above you, use a strong step stool that has a grab bar. Keep electrical cords out of the way. Do not use floor polish or wax that makes floors slippery. If you must use wax, use non-skid floor wax. Do not have throw rugs and other things on the floor that can make you trip. What can I do with my stairs? Do not leave any items on the stairs. Make sure that there are handrails on both sides of the stairs and use them. Fix handrails that are broken or loose. Make sure that handrails are as long as the stairways. Check any carpeting to make sure that it is firmly attached to the stairs. Fix any carpet that is loose or worn. Avoid having throw rugs at the top or bottom of  the stairs. If you do have throw rugs, attach them to the floor with carpet tape. Make sure that you have a light switch at the top of the stairs and the bottom of the stairs. If you do not have them, ask someone to add them for you. What else can I do to help prevent falls? Wear shoes that: Do not have high heels. Have rubber bottoms. Are comfortable and fit you well. Are closed at the toe. Do not wear sandals. If you use a stepladder: Make sure that it is fully opened. Do not climb a closed stepladder. Make sure that both sides of the stepladder are locked into place. Ask someone to hold it for you, if possible. Clearly mark and make sure that you can see: Any grab bars or handrails. First and last  steps. Where the edge of each step is. Use tools that help you move around (mobility aids) if they are needed. These include: Canes. Walkers. Scooters. Crutches. Turn on the lights when you go into a dark area. Replace any light bulbs as soon as they burn out. Set up your furniture so you have a clear path. Avoid moving your furniture around. If any of your floors are uneven, fix them. If there are any pets around you, be aware of where they are. Review your medicines with your doctor. Some medicines can make you feel dizzy. This can increase your chance of falling. Ask your doctor what other things that you can do to help prevent falls. This information is not intended to replace advice given to you by your health care provider. Make sure you discuss any questions you have with your health care provider. Document Released: 01/24/2009 Document Revised: 09/05/2015 Document Reviewed: 05/04/2014 Elsevier Interactive Patient Education  2017 [employer].

## 2021-12-12 NOTE — Progress Notes (Signed)
Virtual Visit via Telephone Note  I connected with  Jamie Curry on 12/12/21 at  8:45 AM EDT by telephone and verified that I am speaking with the correct person using two identifiers.  Location: Patient: home Provider: [place] Persons participating in the virtual visit: [place]   I discussed the limitations, risks, security and privacy concerns of performing an evaluation and management service by telephone and the availability of in person appointments. The patient expressed understanding and agreed to proceed.  Interactive audio and video telecommunications were attempted between this nurse and patient, however failed, due to patient having technical difficulties OR patient did not have access to video capability.  We continued and completed visit with audio only.  Some vital signs may be absent or patient reported.   Dionisio David, LPN  Subjective:   Jamie Curry is a 76 y.o. female who presents for Medicare Annual (Subsequent) preventive examination.  Review of Systems     Cardiac Risk Factors include: advanced age (>80mn, >>81women);hypertension;dyslipidemia     Objective:    There were no vitals filed for this visit. There is no height or weight on file to calculate BMI.     12/12/2021    8:52 AM 07/07/2021   11:15 AM 01/06/2021   10:41 AM 06/07/2020   10:01 AM 09/08/2019    9:26 AM 05/16/2019    2:25 PM 11/10/2018    2:15 PM  Advanced Directives  Does Patient Have a Medical Advance Directive? No No No No No No No  Copy of Healthcare Power of Attorney in Chart?   No - copy requested      Would patient like information on creating a medical advance directive? No - Patient declined No - Patient declined   No - Patient declined No - Patient declined     Current Medications (verified) Outpatient Encounter Medications as of 12/12/2021  Medication Sig   acetaminophen (TYLENOL) 500 MG tablet Take 1,000 mg by mouth every 6 (six) hours as  needed for mild pain.   albuterol (VENTOLIN HFA) 108 (90 Base) MCG/ACT inhaler Inhale into the lungs every 6 (six) hours as needed for wheezing or shortness of breath.   ALPRAZolam (XANAX) 0.5 MG tablet 1/2 tab in the AM and 1 tab in the PM if needed.   amLODipine (NORVASC) 5 MG tablet TAKE 1 TABLET BY MOUTH ONCE A DAY   diclofenac (VOLTAREN) 50 MG EC tablet TAKE 1 TABLET BY MOUTH ONCE A DAY   escitalopram (LEXAPRO) 20 MG tablet Take 1 tablet (20 mg total) by mouth daily.   fluticasone (FLONASE) 50 MCG/ACT nasal spray Place into both nostrils.   hydrochlorothiazide (HYDRODIURIL) 25 MG tablet TAKE 1 TABLET BY MOUTH ONCE A DAY   montelukast (SINGULAIR) 10 MG tablet Take 1 tablet (10 mg total) by mouth at bedtime.   NYSTATIN powder APPLY 1 APPLICATION TOPICALLY 3 TIMES DAILY   rosuvastatin (CRESTOR) 10 MG tablet Take 1 tablet by mouth daily.   No facility-administered encounter medications on file as of 12/12/2021.    Allergies (verified) Penicillins, Zithromax [azithromycin], Anastrozole, Cefuroxime axetil, Cefuroxime axetil, Codeine, Erythromycin, Nitrofurantoin, Ofloxacin, Prednisone, Propoxyphene, Tetracycline, and Sulfa antibiotics   History: Past Medical History:  Diagnosis Date   Anxiety    Asthma    Breast cancer ([facility] 02/07/2016   left breast invasive lobular carcinoma   CAD (coronary artery disease)    mild CAD by cath in 2001   Depression    Diverticulosis  Heart murmur    HTN (hypertension)    Hyperlipidemia    Palpitations    Personal history of radiation therapy 2018   left breast ca   Squamous cell carcinoma of skin 12/09/2017   R lat base of neck, SCCIS   Syncope    Pre-syncope   Past Surgical History:  Procedure Laterality Date   ABDOMINAL HYSTERECTOMY     BREAST BIOPSY Left 02/07/2016   grade II invasive and in situ mammary carcinoma   BREAST CYST ASPIRATION Left 02/13/2016   cyst   BREAST LUMPECTOMY Left 03/03/2016   invasive lobular carcinoma, clear  margins, METASTATIC LOBULAR CARCINOMA, 2.5 MM, IN ONE LYMPH NODE (1/1).    CHOLECYSTECTOMY     KNEE ARTHROSCOPY Right    PARTIAL MASTECTOMY WITH NEEDLE LOCALIZATION Left 03/03/2016   Procedure: PARTIAL MASTECTOMY WITH NEEDLE LOCALIZATION;  Surgeon: Leonie Green, MD;  Location: [facility] ORS;  Service: General;  Laterality: Left;   SENTINEL NODE BIOPSY Left 03/03/2016   Procedure: SENTINEL NODE BIOPSY;  Surgeon: Leonie Green, MD;  Location: [facility] OR[facility];  Service: General;  Laterality: Left;   TUBAL LIGATION     Family History  Problem Relation Age of Onset   Cancer Father        oral cancer   Cancer Sister        colon cancer and lymphoma   Colon cancer Sister    Breast cancer Neg Hx    Social History   Socioeconomic History   Marital status: Married    Spouse name: Not on file   Number of children: Not on file   Years of education: Not on file   Highest education level: Not on file  Occupational History   Not on file  Tobacco Use   Smoking status: Never   Smokeless tobacco: Never  Substance and Sexual Activity   Alcohol use: No    Alcohol/week: 0.0 standard drinks of alcohol   Drug use: No   Sexual activity: Not on file  Other Topics Concern   Not on file  Social History Narrative   Lives alone with her dog.  Divorced.     No kids    Prev worked assembly work.  Retired at age 54.    Social Determinants of Health   Financial Resource Strain: Low Risk  (12/12/2021)   Overall Financial Resource Strain (CARDIA)    Difficulty of Paying Living Expenses: Not very hard  Food Insecurity: No Food Insecurity (12/12/2021)   Hunger Vital Sign    Worried About Running Out of Food in the Last Year: Never true    Ran Out of Food in the Last Year: Never true  Transportation Needs: No Transportation Needs (12/12/2021)   PRAPARE - Hydrologist (Medical): No    Lack of Transportation (Non-Medical): No  Physical Activity: Insufficiently Active (12/12/2021)    Exercise Vital Sign    Days of Exercise per Week: 3 days    Minutes of Exercise per Session: 30 min  Stress: No Stress Concern Present (12/12/2021)   [place]    Feeling of Stress : Only a little  Social Connections: Moderately Isolated (12/12/2021)   Social Connection and Isolation Panel [NHANES]    Frequency of Communication with Friends and Family: More than three times a week    Frequency of Social Gatherings with Friends and Family: Twice a week    Attends Religious Services: More than 4  times per year    Active Member of Clubs or Organizations: No    Attends Archivist Meetings: Never    Marital Status: Divorced    Tobacco Counseling Counseling given: Not Answered   Clinical Intake:  Pre-visit preparation completed: Yes  Pain : No/denies pain     Nutritional Risks: None Diabetes: No  How often do you need to have someone help you when you read instructions, pamphlets, or other written materials from your doctor or pharmacy?: 1 - Never  Diabetic?no  Interpreter Needed?: No  Information entered by :: Kirke Shaggy, LPN   Activities of Daily Living    12/12/2021    8:52 AM  In your present state of health, do you have any difficulty performing the following activities:  Hearing? 0  Vision? 0  Difficulty concentrating or making decisions? 0  Walking or climbing stairs? 0  Dressing or bathing? 0  Doing errands, shopping? 0  Preparing Food and eating ? N  Using the Toilet? N  In the past six months, have you accidently leaked urine? N  Do you have problems with loss of bowel control? N  Managing your Medications? N  Managing your Finances? N  Housekeeping or managing your Housekeeping? N    Patient Care Team: Tonia Ghent, MD as PCP - General (Family Medicine)  Indicate any recent Medical Services you may have received from other than [facility] providers in the past year (date may  be approximate).     Assessment:   This is a routine wellness examination for Jamie Curry.  Hearing/Vision screen Hearing Screening - Comments:: No aids Vision Screening - Comments:: Wears glasses- Dr. Gloriann Loan  Dietary issues and exercise activities discussed: Current Exercise Habits: Home exercise routine, Type of exercise: walking, Time (Minutes): 30, Frequency (Times/Week): 3, Weekly Exercise (Minutes/Week): 90, Intensity: Mild   Goals Addressed             This Visit's Progress    DIET - EAT MORE FRUITS AND VEGETABLES         Depression Screen    12/12/2021    8:49 AM 10/30/2021    9:39 AM  PHQ 2/9 Scores  PHQ - 2 Score 1 1  PHQ- 9 Score 3 4    Fall Risk    12/12/2021    8:52 AM  Fall Risk   Falls in the past year? 0  Number falls in past yr: 0  Injury with Fall? 0  Risk for fall due to : No Fall Risks  Follow up Falls evaluation completed    FALL RISK PREVENTION PERTAINING TO THE HOME:  Any stairs in or around the home? No  If so, are there any without handrails? No  Home free of loose throw rugs in walkways, pet beds, electrical cords, etc? Yes  Adequate lighting in your home to reduce risk of falls? Yes   ASSISTIVE DEVICES UTILIZED TO PREVENT FALLS:  Life alert? No  Use of a cane, walker or w/c? No  Grab bars in the bathroom? Yes  Shower chair or bench in shower? No  Elevated toilet seat or a handicapped toilet? Yes   Cognitive Function:        12/12/2021    8:54 AM  6CIT Screen  What Year? 0 points  What month? 0 points  What time? 0 points  Count back from 20 0 points  Months in reverse 0 points  Repeat phrase 2 points  Total Score 2 points  Immunizations Immunization History  Administered Date(s) Administered   Fluad Quad(high Dose 65+) 12/28/2018   Influenza Split 12/19/2009, 12/29/2010, 01/06/2012, 01/16/2013, 01/25/2014   Influenza, High Dose Seasonal PF 01/07/2016, 01/06/2018, 12/28/2018   Influenza,inj,Quad PF,6+ Mos 01/16/2013,  01/25/2014, 01/03/2015, 01/12/2017   Influenza-Unspecified 01/01/2016   PFIZER(Purple Top)SARS-COV-2 Vaccination 09/16/2019, 10/07/2019   Pneumococcal Conjugate-13 07/25/2014   Pneumococcal-Unspecified 12/19/2009, 12/29/2010, 01/16/2013   Tdap 12/13/2008   Zoster Recombinat (Shingrix) 01/26/2017, 04/27/2017   Zoster, Live 12/19/2009, 04/27/2017    TDAP status: Due, Education has been provided regarding the importance of this vaccine. Advised may receive this vaccine at local pharmacy or Health Dept. Aware to provide a copy of the vaccination record if obtained from local pharmacy or Health Dept. Verbalized acceptance and understanding.  Flu Vaccine status: Declined, Education has been provided regarding the importance of this vaccine but patient still declined. Advised may receive this vaccine at local pharmacy or Health Dept. Aware to provide a copy of the vaccination record if obtained from local pharmacy or Health Dept. Verbalized acceptance and understanding.  Pneumococcal vaccine status: Up to date  Covid-19 vaccine status: Completed vaccines  Qualifies for Shingles Vaccine? Yes   Zostavax completed Yes   Shingrix Completed?: Yes  Screening Tests Health Maintenance  Topic Date Due   Hepatitis C Screening  Never done   Pneumonia Vaccine 14+ Years old (2 - PPSV23 or PCV20) 07/25/2015   TETANUS/TDAP  12/14/2018   COVID-19 Vaccine (3 - Pfizer risk series) 11/04/2019   INFLUENZA VACCINE  11/11/2021   DEXA SCAN  Completed   Zoster Vaccines- Shingrix  Completed   HPV VACCINES  Aged Out    Health Maintenance  Health Maintenance Due  Topic Date Due   Hepatitis C Screening  Never done   Pneumonia Vaccine 17+ Years old (2 - PPSV23 or PCV20) 07/25/2015   TETANUS/TDAP  12/14/2018   COVID-19 Vaccine (3 - Pfizer risk series) 11/04/2019   INFLUENZA VACCINE  11/11/2021    Colorectal cancer screening: No longer required.   Mammogram status: Completed 03/31/21. Repeat every year-  has appt 12/4  Bone Density status: Completed 05/07/20. Results reflect: Bone density results: OSTEOPENIA. Repeat every 5 years.  Lung Cancer Screening: (Low Dose CT Chest recommended if Age 65-80 years, 30 pack-year currently smoking OR have quit w/in 15years.) does not qualify.   Additional Screening:  Hepatitis C Screening: does qualify; Completed no  Vision Screening: Recommended annual ophthalmology exams for early detection of glaucoma and other disorders of the eye. Is the patient up to date with their annual eye exam?  Yes  Who is the provider or what is the name of the office in which the patient attends annual eye exams? [place] If pt is not established with a provider, would they like to be referred to a provider to establish care? No .   Dental Screening: Recommended annual dental exams for proper oral hygiene  Community Resource Referral / Chronic Care Management: CRR required this visit?  No   CCM required this visit?  No      Plan:     I have personally reviewed and noted the following in the patient's chart:   Medical and social history Use of alcohol, tobacco or illicit drugs  Current medications and supplements including opioid prescriptions. Patient is not currently taking opioid prescriptions. Functional ability and status Nutritional status Physical activity Advanced directives List of other physicians Hospitalizations, surgeries, and ER visits in previous 12 months Vitals Screenings to include cognitive, depression,  and falls Referrals and appointments  In addition, I have reviewed and discussed with patient certain preventive protocols, quality metrics, and best practice recommendations. A written personalized care plan for preventive services as well as general preventive health recommendations were provided to patient.     Dionisio David, LPN   07/18/4257   Nurse Notes: none

## 2021-12-12 NOTE — Telephone Encounter (Signed)
Last filled 11-20-21 #30 Last OV 10-30-21 Next OV 12-25-21 [place]

## 2021-12-17 ENCOUNTER — Telehealth: Payer: Self-pay | Admitting: Orthopaedic Surgery

## 2021-12-17 ENCOUNTER — Other Ambulatory Visit: Payer: Self-pay | Admitting: Family Medicine

## 2021-12-17 NOTE — Telephone Encounter (Signed)
Patient aware she needs to get this from her PCP

## 2021-12-17 NOTE — Telephone Encounter (Signed)
Pt would like refill on diclofenac (VOLTAREN) 50 MG

## 2021-12-17 NOTE — Telephone Encounter (Signed)
Refill request Diclofenac Last refill 3/7/233 #90/1 Last office 10/30/21

## 2021-12-18 DIAGNOSIS — H35033 Hypertensive retinopathy, bilateral: Secondary | ICD-10-CM | POA: Diagnosis not present

## 2021-12-22 ENCOUNTER — Telehealth: Payer: Self-pay

## 2021-12-22 NOTE — Progress Notes (Signed)
Chronic Care Management Pharmacy Assistant   Name: Jamie Curry  MRN: [id number] DOB: 06-27-1945  Reason for Encounter: CCM (Initial Questions)  Recent office visits:  12/12/21 AWV 10/30/21 Elsie Stain, MD Anxiety Change: Xanax 0.5 tab in AM - 1 later in day 09/03/21 Telephone Results "She has mild shoulder arthritis.  I think it makes sense to try physical therapy in [place] to help with rotator cuff symptoms.  I put in the order for that.  She should get a call about scheduling.  If she does not get better then we can refer her to orthopedics if needed. She has mild liver test elevations and I think it makes sense to get a liver ultrasound done.  She could have fatty deposits in her liver causing these lab abnormalities, especially since her lipids are elevated.  I pended the order below.  Please let me know if she consents and I will sign off. I looked back in her records about her cardiac murmur.  I think it makes sense to get an echo done to evaluate the valves in her heart, just to make sure that is not an issue.  I do not expect this to be a problem but I think it makes sense to get it done.  I pended this order too." Patient is fine with doing everything.  09/01/21 Elsie Stain, MD HTN Ordered: DG Shoulder left Stop the following:  busPIRone (BUSPAR) 15 MG tablet (change) Calcium Carb-Cholecalciferol 500-400 MG-UNIT CHEW (no longer needed) citalopram (CELEXA) 20 MG tablet (completed) clarithromycin (BIAXIN) 500 MG tablet (completed) exemestane (AROMASIN) 25 MG tablet (no longer needed) fluticasone (FLONASE) 50 MCG/ACT nasal spray (no longer needed)  Recent consult visits:  10/09/21 US Abdomen 09/17/21 Kate Sable (Cardiology) Cardiac Murmur No other information 09/03/21 Sarina Ser, MD (Derm) [place] Procedure: Cryotherapy destruction of lesion 07/11/21 Marton Redwood (Internal Medicine) HTN No other information 07/07/21 Randa Evens, MD (Oncology) Surveillance of  breast cancer No med changes  Hospital visits:  None in previous 6 months  Medications: Outpatient Encounter Medications as of 12/22/2021  Medication Sig   acetaminophen (TYLENOL) 500 MG tablet Take 1,000 mg by mouth every 6 (six) hours as needed for mild pain.   albuterol (VENTOLIN HFA) 108 (90 Base) MCG/ACT inhaler Inhale into the lungs every 6 (six) hours as needed for wheezing or shortness of breath.   ALPRAZolam (XANAX) 0.5 MG tablet 1/2 tab in the AM and 1 tab in the PM if needed.  Sedation caution.   amLODipine (NORVASC) 5 MG tablet TAKE 1 TABLET BY MOUTH ONCE A DAY   diclofenac (VOLTAREN) 50 MG EC tablet Take 1 tablet (50 mg total) by mouth daily. As needed with food.   escitalopram (LEXAPRO) 20 MG tablet Take 1 tablet (20 mg total) by mouth daily.   fluticasone (FLONASE) 50 MCG/ACT nasal spray Place into both nostrils.   hydrochlorothiazide (HYDRODIURIL) 25 MG tablet TAKE 1 TABLET BY MOUTH ONCE A DAY   montelukast (SINGULAIR) 10 MG tablet Take 1 tablet (10 mg total) by mouth at bedtime.   NYSTATIN powder APPLY 1 APPLICATION TOPICALLY 3 TIMES DAILY   rosuvastatin (CRESTOR) 10 MG tablet Take 1 tablet by mouth daily.   No facility-administered encounter medications on file as of 12/22/2021.    Lab Results  Component Value Date/Time   HGBA1C (H) 06/18/2010 06:32 PM    6.0 (NOTE)  According to the ADA Clinical Practice Recommendations for 2011, when HbA1c is used as a screening test:   >=6.5%   Diagnostic of Diabetes Mellitus           (if abnormal result  is confirmed)  5.7-6.4%   Increased risk of developing Diabetes Mellitus  References:Diagnosis and Classification of Diabetes Mellitus,Diabetes NZVJ,2820,60(RVIFB 1):S62-S69 and Standards of Medical Care in         Diabetes - 2011,Diabetes PPHK,3276,14  (Suppl 1):S11-S61.     BP Readings from Last 3 Encounters:  10/30/21 122/62  09/01/21 122/60  07/07/21  139/69    Unsuccessful attempt to reach patient. Left patient message reminding patient of appointment.  Patient contacted to confirm telephone appointment with Charlene Brooke, Pharm D, on 12/25/2021 at 11:45.   CCM referral has been placed prior to visit?  No   Star Rating Drugs:  Medication:  Last Fill: Day Supply Rosuvastatin 10 mg 09/01/21 90 - refill waiting to be picked up Fill date verified with [place] Gaps: Annual wellness visit in last year? Yes 12/12/2021 Most Recent BP reading: 122/62 on 10/30/2021  Charlene Brooke, CPP notified  Marijean Niemann, [place] Clinical Pharmacy Assistant [phone]

## 2021-12-25 ENCOUNTER — Ambulatory Visit: Payer: PPO | Admitting: Pharmacist

## 2021-12-25 DIAGNOSIS — F419 Anxiety disorder, unspecified: Secondary | ICD-10-CM

## 2021-12-25 DIAGNOSIS — E785 Hyperlipidemia, unspecified: Secondary | ICD-10-CM

## 2021-12-25 DIAGNOSIS — I10 Essential (primary) hypertension: Secondary | ICD-10-CM

## 2021-12-25 DIAGNOSIS — J45909 Unspecified asthma, uncomplicated: Secondary | ICD-10-CM

## 2021-12-25 NOTE — Patient Instructions (Signed)
Visit Information  Phone number for Pharmacist: [phone]   Goals Addressed   None     Care Plan : [place]  Updates made by Charlton Haws, [place] since 12/25/2021 12:00 AM     Problem: Hypertension, Hyperlipidemia, Asthma, Anxiety, and Osteoarthritis      Long-Range Goal: Disease mgmt   Start Date: 12/25/2021  Expected End Date: 12/26/2022  This Visit's Progress: On track  Priority: High  Note:   Current Barriers:  None identified  Pharmacist Clinical Goal(s):  Patient will contact provider office for questions/concerns as evidenced notation of same in electronic health record through collaboration with PharmD and provider.   Interventions: 1:1 collaboration with Tonia Ghent, MD regarding development and update of comprehensive plan of care as evidenced by provider attestation and co-signature Inter-disciplinary care team collaboration (see longitudinal plan of care) Comprehensive medication review performed; medication list updated in electronic medical record  Hypertension (BP goal <130/80) -Controlled - per recent clinic visits -Current treatment: Amlodipine 5 mg daily - Appropriate, Effective, Safe, Accessible HCTZ 25 mg daily -Appropriate, Effective, Safe, Accessible -Medications previously tried: n/a  -Denies hypotensive/hypertensive symptoms -Educated on BP goals and benefits of medications for prevention of heart attack, stroke and kidney damage; -Counseled to monitor BP at home periodically -Recommended to continue current medication  Hyperlipidemia: (LDL goal < 70) -Uncontrolled - LDL 136 (08/2021) above goal, but patient was not adherent to statin at the time; she has since restarted statin therapy and endorses daily compliance -Current treatment: Rosuvastatin 10 mg daily -Appropriate, Query Effective -Medications previously tried: simvastatin -Educated on Cholesterol goals; Benefits of statin for ASCVD risk reduction; -Recommended  to continue current medication  Asthma (Goal: control symptoms and prevent exacerbations) -Controlled -Pulmonary function testing: not on file -Exacerbations requiring treatment in last 6 months: 0 -Current treatment  Montelukast 10 mg daily HS - Appropriate, Effective, Safe, Accessible -Medications previously tried: n/a  -Recommended to continue current medication  Depression/Anxiety (Goal: manage symptoms) -Controlled - pt is interested in seeing behavioral health in office -Current treatment: Escitalopram 20 mg daily - Appropriate, Effective, Safe, Accessible Alprazolam 0.5 mg - 1/2 tab AM, 1 tab PM - Appropriate, Effective, Safe, Accessible -Medications previously tried/failed: n/a -PHQ9: 3 (12/2021) - mild depression -GAD7: 4 (10/2021) - mild anxiety -Connected with PCP for mental health support -Educated on Benefits of medication for symptom control;Benefits of cognitive-behavioral therapy with or without medication -Recommended to continue current medication; provided phone number to schedule behavioral health appt in office  Osteoarthritis (Goal: manage symptoms) -Controlled -Current treatment  Diclofenac 50 mg BID - Appropriate, Effective, Safe, Accessible -Medications previously tried: n/a -Reviewed to take diclofenac with food  -Recommended to continue current medication  Health Maintenance -Vaccine gaps: Flu, Prevnar 20, TDAP  Patient Goals/Self-Care Activities Patient will:  - take medications as prescribed as evidenced by patient report and record review focus on medication adherence by routine check blood pressure periodically, document, and provide at future appointments       Patient verbalizes understanding of instructions and care plan provided today and agrees to view in [place]. Active MyChart status and patient understanding of how to access instructions and care plan via MyChart confirmed with patient.    Telephone follow up appointment with pharmacy  team member scheduled for: 6 months  Charlene Brooke, PharmD, [facility] Clinical Pharmacist [facility] at [facility] [phone]

## 2021-12-25 NOTE — Progress Notes (Signed)
Chronic Care Management Pharmacy Note  12/25/2021 Name:  Jamie Curry MRN:  [id number] DOB:  02/15/1946  Summary: CCM Initial visit -Reviewed medications; pt affirms compliance as precribed -HLD: LDL 136 (08/2021) while pt was not adherent to statin; she affirms adherence to rosuvastatin 10 mg daily today -Anxiety: pt voiced interest in seeing behavioral health  Recommendations/Changes made from today's visit: -Repeat lipid panel at next OV -Provided phone number to schedule behavioral health appt  Plan: -[place] will call patient 3 months for general update -Pharmacist follow up televisit scheduled for 6 months -PCP annual visit due May 2024     Subjective: Jamie Curry is an 76 y.o. year old female who is a primary patient of Damita Dunnings, Elveria Rising, MD.  The CCM team was consulted for assistance with disease management and care coordination needs.    Engaged with patient by telephone for follow up visit in response to provider referral for pharmacy case management and/or care coordination services.   Consent to Services:  The patient was given information about Chronic Care Management services, agreed to services, and gave verbal consent prior to initiation of services.  Please see initial visit note for detailed documentation.   Patient Care Team: Tonia Ghent, MD as PCP - General (Family Medicine) Charlton Haws, [facility] as Pharmacist (Pharmacist)  Recent office visits: 12/12/21 AWV 12/01/21 phone note - referral to psychology 10/30/21 Dr Damita Dunnings OV: mood f/u - try 1/2 xanax in AM and 1 PM. Consider buspar.  09/03/21 Dr Damita Dunnings OV: establish care - mild shoulder arthritis. Refer to PT. Mild LFT elevation, order liver US. Cardiac murmur, order ECHO.  Recent consult visits: 09/03/21 Dr Nehemiah Massed (Dermatology): [facility] - destruction of lesions.  Hospital visits: None in previous 6 months   Objective:  Lab Results  Component Value Date   CREATININE 0.92  09/01/2021   BUN 22 09/01/2021   GFR 60.49 09/01/2021   GFRNONAA >60 06/07/2020   GFRAA >60 05/16/2019   NA 136 09/01/2021   K 4.4 09/01/2021   CALCIUM 9.9 09/01/2021   CO2 31 09/01/2021   GLUCOSE 92 09/01/2021    Lab Results  Component Value Date/Time   HGBA1C (H) 06/18/2010 06:32 PM    6.0 (NOTE)                                                                       According to the ADA Clinical Practice Recommendations for 2011, when HbA1c is used as a screening test:   >=6.5%   Diagnostic of Diabetes Mellitus           (if abnormal result  is confirmed)  5.7-6.4%   Increased risk of developing Diabetes Mellitus  References:Diagnosis and Classification of Diabetes Mellitus,Diabetes NKNL,9767,34(LPFXT 1):S62-S69 and Standards of Medical Care in         Diabetes - 2011,Diabetes Care,2011,34  (Suppl 1):S11-S61.   GFR 60.49 09/01/2021 11:59 AM   GFR 87.35 11/17/2011 08:54 AM    Last diabetic Eye exam: No results found for: "HMDIABEYEEXA"  Last diabetic Foot exam: No results found for: "HMDIABFOOTEX"   Lab Results  Component Value Date   CHOL 237 (H) 09/01/2021   HDL 72.80 09/01/2021   LDLCALC 136 (H) 09/01/2021  LDLDIRECT 161.2 11/17/2011   TRIG 141.0 09/01/2021   CHOLHDL 3 09/01/2021       Latest Ref Rng & Units 09/01/2021   11:59 AM 06/07/2020    9:42 AM 05/16/2019    2:07 PM  Hepatic Function  Total Protein 6.0 - 8.3 g/dL 7.3  7.9  7.9   Albumin 3.5 - 5.2 g/dL 4.5  4.5  4.5   AST 0 - 37 U/L 34  32  26   ALT 0 - 35 U/L 58  32  28   Alk Phosphatase 39 - 117 U/L 153  93  99   Total Bilirubin 0.2 - 1.2 mg/dL 0.8  0.9  0.7     Lab Results  Component Value Date/Time   TSH 2.83 09/01/2021 11:59 AM   TSH 3.125 06/18/2010 06:32 PM       Latest Ref Rng & Units 09/01/2021   11:59 AM 06/07/2020    9:42 AM 05/16/2019    2:07 PM  CBC  WBC 4.0 - 10.5 K/uL 4.7  4.4  4.7   Hemoglobin 12.0 - 15.0 g/dL 12.6  12.9  12.8   Hematocrit 36.0 - 46.0 % 37.7  39.3  38.8   Platelets  150.0 - 400.0 K/uL 206.0  240  219     No results found for: "VD25OH"  Clinical ASCVD: No  The ASCVD Risk score (Arnett DK, et al., 2019) failed to calculate for the following reasons:   The systolic blood pressure is missing       12/12/2021    8:49 AM 10/30/2021    9:39 AM  Depression screen PHQ 2/9  Decreased Interest 0 0  Down, Depressed, Hopeless 1 1  PHQ - 2 Score 1 1  Altered sleeping 1 1  Tired, decreased energy 1 0  Change in appetite 0 1  Feeling bad or failure about yourself  0 1  Trouble concentrating 0 0  Moving slowly or fidgety/restless 0 0  Suicidal thoughts 0 0  PHQ-9 Score 3 4  Difficult doing work/chores Not difficult at all       Social History   Tobacco Use  Smoking Status Never  Smokeless Tobacco Never   BP Readings from Last 3 Encounters:  10/30/21 122/62  09/01/21 122/60  07/07/21 139/69   Pulse Readings from Last 3 Encounters:  10/30/21 72  09/01/21 76  07/07/21 63   Wt Readings from Last 3 Encounters:  12/12/21 183 lb (83 kg)  10/30/21 183 lb (83 kg)  09/01/21 182 lb (82.6 kg)   BMI Readings from Last 3 Encounters:  12/12/21 31.91 kg/m  10/30/21 31.91 kg/m  09/01/21 31.73 kg/m    Assessment/Interventions: Review of patient past medical history, allergies, medications, health status, including review of consultants reports, laboratory and other test data, was performed as part of comprehensive evaluation and provision of chronic care management services.   SDOH:  (Social Determinants of Health) assessments and interventions performed: No SDOH Interventions    Flowsheet Row Clinical Support from 12/12/2021 in [place] at [place] Interventions   Food Insecurity Interventions Intervention Not Indicated  Housing Interventions Intervention Not Indicated  Transportation Interventions Intervention Not Indicated  Depression Interventions/Treatment  Medication  Financial Strain Interventions Intervention Not  Indicated  Physical Activity Interventions Intervention Not Indicated  Stress Interventions Intervention Not Indicated  Social Connections Interventions Intervention Not Indicated      SDOH Screenings   Food Insecurity: No Food Insecurity (12/12/2021)  Housing: Low Risk  (  12/12/2021)  Transportation Needs: No Transportation Needs (12/12/2021)  Alcohol Screen: Low Risk  (12/12/2021)  Depression (PHQ2-9): Low Risk  (12/12/2021)  Financial Resource Strain: Low Risk  (12/12/2021)  Physical Activity: Insufficiently Active (12/12/2021)  Social Connections: Moderately Isolated (12/12/2021)  Stress: No Stress Concern Present (12/12/2021)  Tobacco Use: Low Risk  (12/12/2021)    CCM Care Plan  Allergies  Allergen Reactions   Penicillins Anaphylaxis    REACTION: severe anaphylaxis Has patient had a PCN reaction causing immediate rash, facial/tongue/throat swelling, SOB or lightheadedness with hypotension: Yes Has patient had a PCN reaction causing severe rash involving mucus membranes or skin necrosis: No Has patient had a PCN reaction that required hospitalization No Has patient had a PCN reaction occurring within the last 10 years: No If all of the above answers are "NO", then may proceed with Cephalosporin use.    Zithromax [Azithromycin] Rash    z-pack = rash   Anastrozole Other (See Comments)    Extreme sweating   Cefuroxime Axetil Other (See Comments)    unknown   Codeine Other (See Comments)    unknown   Erythromycin Other (See Comments)    unknown   Nitrofurantoin Other (See Comments)    unknown   Ofloxacin Other (See Comments)    unknown   Prednisone Other (See Comments)   Propoxyphene Other (See Comments)   Tetracycline Other (See Comments)    unknown   Sulfa Antibiotics Other (See Comments) and Rash    Medications Reviewed Today     Reviewed by Charlton Haws, [facility] (Pharmacist) on 12/25/21 at 1201  Med List Status: <None>   Medication Order Taking? Sig Documenting Provider  Last Dose Status Informant  acetaminophen (TYLENOL) 500 MG tablet [id number] Yes Take 1,000 mg by mouth every 6 (six) hours as needed for mild pain. [provider] Taking Active Self  albuterol (VENTOLIN HFA) 108 (90 Base) MCG/ACT inhaler [id number] Yes Inhale into the lungs every 6 (six) hours as needed for wheezing or shortness of breath. [provider] Taking Active   ALPRAZolam Duanne Moron) 0.5 MG tablet [id number] Yes 1/2 tab in the AM and 1 tab in the PM if needed.  Sedation caution. Tonia Ghent, MD Taking Active   amLODipine (NORVASC) 5 MG tablet [id number] Yes TAKE 1 TABLET BY MOUTH ONCE A DAY Tonia Ghent, MD Taking Active   diclofenac (VOLTAREN) 50 MG EC tablet [id number] Yes Take 1 tablet (50 mg total) by mouth daily. As needed with food. Tonia Ghent, MD Taking Active   escitalopram (LEXAPRO) 20 MG tablet [id number] Yes Take 1 tablet (20 mg total) by mouth daily. Tonia Ghent, MD Taking Active   fluticasone [facility]) 50 MCG/ACT nasal spray [id number] Yes Place into both nostrils. [provider] Taking Active   hydrochlorothiazide (HYDRODIURIL) 25 MG tablet [id number] Yes TAKE 1 TABLET BY MOUTH ONCE A DAY Tonia Ghent, MD Taking Active   montelukast (SINGULAIR) 10 MG tablet [id number] Yes Take 1 tablet (10 mg total) by mouth at bedtime. Tonia Ghent, MD Taking Active   NYSTATIN powder [id number] Yes APPLY 1 APPLICATION TOPICALLY 3 TIMES DAILY Sindy Guadeloupe, MD Taking Active   rosuvastatin (CRESTOR) 10 MG tablet [id number] Yes Take 1 tablet by mouth daily. [provider] Taking Active             Patient Active Problem List   Diagnosis Date Noted   Advance care planning 09/03/2021   Left shoulder pain 09/03/2021   Asthma  09/03/2021   Anxiety 09/03/2021   Unilateral primary osteoarthritis, left knee 08/19/2016   Unilateral primary osteoarthritis, right knee 08/19/2016   Hot flashes 03/25/2016   Breast cancer of upper-outer  quadrant of left female breast ([place]) 02/14/2016   Hyperlipidemia 11/12/2011   HTN (hypertension) 12/29/2010   Palpitations 12/29/2010    Immunization History  Administered Date(s) Administered   Fluad Quad(high Dose 65+) 12/28/2018   Influenza Split 12/19/2009, 12/29/2010, 01/06/2012, 01/16/2013, 01/25/2014   Influenza, High Dose Seasonal PF 01/07/2016, 01/06/2018, 12/28/2018   Influenza,inj,Quad PF,6+ Mos 01/16/2013, 01/25/2014, 01/03/2015, 01/12/2017   Influenza-Unspecified 01/01/2016   PFIZER(Purple Top)SARS-COV-2 Vaccination 09/16/2019, 10/07/2019   Pneumococcal Conjugate-13 07/25/2014   Pneumococcal-Unspecified 12/19/2009, 12/29/2010, 01/16/2013   Tdap 12/13/2008   Zoster Recombinat (Shingrix) 01/26/2017, 04/27/2017   Zoster, Live 12/19/2009, 04/27/2017    Conditions to be addressed/monitored:  Hypertension, Hyperlipidemia, Asthma, Anxiety, and Osteoarthritis  Care Plan : [place]  Updates made by Charlton Haws, [place] since 12/25/2021 12:00 AM     Problem: Hypertension, Hyperlipidemia, Asthma, Anxiety, and Osteoarthritis      Long-Range Goal: Disease mgmt   Start Date: 12/25/2021  Expected End Date: 12/26/2022  This Visit's Progress: On track  Priority: High  Note:   Current Barriers:  None identified  Pharmacist Clinical Goal(s):  Patient will contact provider office for questions/concerns as evidenced notation of same in electronic health record through collaboration with PharmD and provider.   Interventions: 1:1 collaboration with Tonia Ghent, MD regarding development and update of comprehensive plan of care as evidenced by provider attestation and co-signature Inter-disciplinary care team collaboration (see longitudinal plan of care) Comprehensive medication review performed; medication list updated in electronic medical record  Hypertension (BP goal <130/80) -Controlled - per recent clinic visits -Current treatment: Amlodipine 5 mg  daily - Appropriate, Effective, Safe, Accessible HCTZ 25 mg daily -Appropriate, Effective, Safe, Accessible -Medications previously tried: n/a  -Denies hypotensive/hypertensive symptoms -Educated on BP goals and benefits of medications for prevention of heart attack, stroke and kidney damage; -Counseled to monitor BP at home periodically -Recommended to continue current medication  Hyperlipidemia: (LDL goal < 70) -Uncontrolled - LDL 136 (08/2021) above goal, but patient was not adherent to statin at the time; she has since restarted statin therapy and endorses daily compliance -Current treatment: Rosuvastatin 10 mg daily -Appropriate, Query Effective -Medications previously tried: simvastatin -Educated on Cholesterol goals; Benefits of statin for ASCVD risk reduction; -Recommended to continue current medication  Asthma (Goal: control symptoms and prevent exacerbations) -Controlled -Pulmonary function testing: not on file -Exacerbations requiring treatment in last 6 months: 0 -Current treatment  Montelukast 10 mg daily HS - Appropriate, Effective, Safe, Accessible -Medications previously tried: n/a  -Recommended to continue current medication  Depression/Anxiety (Goal: manage symptoms) -Controlled - pt is interested in seeing behavioral health in office -Current treatment: Escitalopram 20 mg daily - Appropriate, Effective, Safe, Accessible Alprazolam 0.5 mg - 1/2 tab AM, 1 tab PM - Appropriate, Effective, Safe, Accessible -Medications previously tried/failed: n/a -PHQ9: 3 (12/2021) - mild depression -GAD7: 4 (10/2021) - mild anxiety -Connected with PCP for mental health support -Educated on Benefits of medication for symptom control;Benefits of cognitive-behavioral therapy with or without medication -Recommended to continue current medication; provided phone number to schedule behavioral health appt in office  Osteoarthritis (Goal: manage symptoms) -Controlled -Current treatment   Diclofenac 50 mg BID - Appropriate, Effective, Safe, Accessible -Medications previously tried: n/a -Reviewed to take diclofenac with food  -Recommended to continue current medication  Health Maintenance -  Vaccine gaps: Flu, Prevnar 20, TDAP  Patient Goals/Self-Care Activities Patient will:  - take medications as prescribed as evidenced by patient report and record review focus on medication adherence by routine check blood pressure periodically, document, and provide at future appointments       Medication Assistance: None required.  Patient affirms current coverage meets needs.  Compliance/Adherence/Medication fill history: Care Gaps: None  Star-Rating Drugs: Rosuvastatin - PDC 100%  Medication Access: Within the past 30 days, how often has patient missed a dose of medication? 0 Is a pillbox or other method used to improve adherence? No  Factors that may affect medication adherence? no barriers identified Are meds synced by current pharmacy? No  Are meds delivered by current pharmacy? No  Does patient experience delays in picking up medications due to transportation concerns? No   [facility] Services Reviewed: Is patient disadvantaged to use [facility]?: No  Current Rx insurance plan: HTA Name and location of Current pharmacy:  [place], [place] [address] [place] [address] Phone: [phone] Fax: [phone]  [facility] services reviewed with patient today?: No  Patient requests to transfer care to [facility]?: No  Reason patient declined to change pharmacies: Loyalty to other pharmacy/Patient preference   Care Plan and Follow Up Patient Decision:  Patient agrees to Care Plan and Follow-up.  Plan: Telephone follow up appointment with care management team member scheduled for:  6 months  Charlene Brooke, PharmD, BCACP Clinical Pharmacist [facility] [facility] at [facility] [phone]

## 2022-01-28 ENCOUNTER — Ambulatory Visit (INDEPENDENT_AMBULATORY_CARE_PROVIDER_SITE_OTHER): Payer: PPO | Admitting: Dermatology

## 2022-01-28 DIAGNOSIS — L821 Other seborrheic keratosis: Secondary | ICD-10-CM

## 2022-01-28 DIAGNOSIS — L578 Other skin changes due to chronic exposure to nonionizing radiation: Secondary | ICD-10-CM | POA: Diagnosis not present

## 2022-01-28 DIAGNOSIS — L814 Other melanin hyperpigmentation: Secondary | ICD-10-CM | POA: Diagnosis not present

## 2022-01-28 DIAGNOSIS — Z1283 Encounter for screening for malignant neoplasm of skin: Secondary | ICD-10-CM

## 2022-01-28 DIAGNOSIS — L918 Other hypertrophic disorders of the skin: Secondary | ICD-10-CM | POA: Diagnosis not present

## 2022-01-28 DIAGNOSIS — Z85828 Personal history of other malignant neoplasm of skin: Secondary | ICD-10-CM

## 2022-01-28 DIAGNOSIS — D229 Melanocytic nevi, unspecified: Secondary | ICD-10-CM

## 2022-01-28 DIAGNOSIS — L570 Actinic keratosis: Secondary | ICD-10-CM

## 2022-01-28 DIAGNOSIS — D692 Other nonthrombocytopenic purpura: Secondary | ICD-10-CM | POA: Diagnosis not present

## 2022-01-28 NOTE — Patient Instructions (Signed)
Due to recent changes in healthcare laws, you may see results of your pathology and/or laboratory studies on MyChart before the doctors have had a chance to review them. We understand that in some cases there may be results that are confusing or concerning to you. Please understand that not all results are received at the same time and often the doctors may need to interpret multiple results in order to provide you with the best plan of care or course of treatment. Therefore, we ask that you please give us 2 business days to thoroughly review all your results before contacting the office for clarification. Should we see a critical lab result, you will be contacted sooner.   If You Need Anything After Your Visit  If you have any questions or concerns for your doctor, please call our main line at [phone] and press option 4 to reach your doctor's medical assistant. If no one answers, please leave a voicemail as directed and we will return your call as soon as possible. Messages left after 4 pm will be answered the following business day.   You may also send us a message via MyChart. We typically respond to MyChart messages within 1-2 business days.  For prescription refills, please ask your pharmacy to contact our office. Our fax number is [phone].  If you have an urgent issue when the clinic is closed that cannot wait until the next business day, you can page your doctor at the number below.    Please note that while we do our best to be available for urgent issues outside of office hours, we are not available 24/7.   If you have an urgent issue and are unable to reach us, you may choose to seek medical care at your doctor's office, retail clinic, urgent care center, or emergency room.  If you have a medical emergency, please immediately call 911 or go to the emergency department.  Pager Numbers  - Dr. Kowalski: [phone]  - Dr. Moye: [phone]  - Dr. Stewart:  [phone]  In the event of inclement weather, please call our main line at [phone] for an update on the status of any delays or closures.  Dermatology Medication Tips: Please keep the boxes that topical medications come in in order to help keep track of the instructions about where and how to use these. Pharmacies typically print the medication instructions only on the boxes and not directly on the medication tubes.   If your medication is too expensive, please contact our office at [phone] option 4 or send us a message through MyChart.   We are unable to tell what your co-pay for medications will be in advance as this is different depending on your insurance coverage. However, we may be able to find a substitute medication at lower cost or fill out paperwork to get insurance to cover a needed medication.   If a prior authorization is required to get your medication covered by your insurance company, please allow us 1-2 business days to complete this process.  Drug prices often vary depending on where the prescription is filled and some pharmacies may offer cheaper prices.  The website www.goodrx.com contains coupons for medications through different pharmacies. The prices here do not account for what the cost may be with help from insurance (it may be cheaper with your insurance), but the website can give you the price if you did not use any insurance.  - You can print the associated coupon and take it with   your prescription to the pharmacy.  - You may also stop by our office during regular business hours and pick up a GoodRx coupon card.  - If you need your prescription sent electronically to a different pharmacy, notify our office through [facility] MyChart or by phone at [phone] option 4.     Si Usted Necesita Algo Despus de Su Visita  Tambin puede enviarnos un mensaje a travs de MyChart. Por lo general respondemos a los mensajes de MyChart en el transcurso de 1 a 2  das hbiles.  Para renovar recetas, por favor pida a su farmacia que se ponga en contacto con nuestra oficina. Nuestro nmero de fax es el [phone].  Si tiene un asunto urgente cuando la clnica est cerrada y que no puede esperar hasta el siguiente da hbil, puede llamar/localizar a su doctor(a) al nmero que aparece a continuacin.   Por favor, tenga en cuenta que aunque hacemos todo lo posible para estar disponibles para asuntos urgentes fuera del horario de oficina, no estamos disponibles las 24 horas del da, los 7 das de la semana.   Si tiene un problema urgente y no puede comunicarse con nosotros, puede optar por buscar atencin mdica  en el consultorio de su doctor(a), en una clnica privada, en un centro de atencin urgente o en una sala de emergencias.  Si tiene una emergencia mdica, por favor llame inmediatamente al 911 o vaya a la sala de emergencias.  Nmeros de bper  - Dr. Kowalski: [phone]  - Dra. Moye: [phone]  - Dra. Stewart: [phone]  En caso de inclemencias del tiempo, por favor llame a nuestra lnea principal al [phone] para una actualizacin sobre el estado de cualquier retraso o cierre.  Consejos para la medicacin en dermatologa: Por favor, guarde las cajas en las que vienen los medicamentos de uso tpico para ayudarle a seguir las instrucciones sobre dnde y cmo usarlos. Las farmacias generalmente imprimen las instrucciones del medicamento slo en las cajas y no directamente en los tubos del medicamento.   Si su medicamento es muy caro, por favor, pngase en contacto con nuestra oficina llamando al [phone] y presione la opcin 4 o envenos un mensaje a travs de MyChart.   No podemos decirle cul ser su copago por los medicamentos por adelantado ya que esto es diferente dependiendo de la cobertura de su seguro. Sin embargo, es posible que podamos encontrar un medicamento sustituto a menor costo o llenar un formulario para que el  seguro cubra el medicamento que se considera necesario.   Si se requiere una autorizacin previa para que su compaa de seguros cubra su medicamento, por favor permtanos de 1 a 2 das hbiles para completar este proceso.  Los precios de los medicamentos varan con frecuencia dependiendo del lugar de dnde se surte la receta y alguna farmacias pueden ofrecer precios ms baratos.  El sitio web www.goodrx.com tiene cupones para medicamentos de diferentes farmacias. Los precios aqu no tienen en cuenta lo que podra costar con la ayuda del seguro (puede ser ms barato con su seguro), pero el sitio web puede darle el precio si no utiliz ningn seguro.  - Puede imprimir el cupn correspondiente y llevarlo con su receta a la farmacia.  - Tambin puede pasar por nuestra oficina durante el horario de atencin regular y recoger una tarjeta de cupones de GoodRx.  - Si necesita que su receta se enve electrnicamente a una farmacia diferente, informe a nuestra oficina a travs de MyChart de [place]   o por telfono llamando al [phone] y presione la opcin 4.  

## 2022-01-28 NOTE — Progress Notes (Signed)
Follow-Up Visit   Subjective  Jamie Curry is a 76 y.o. female who presents for the following: Annual Exam (Hx SCC, AK's ). The patient presents for Total-Body Skin Exam (TBSE) for skin cancer screening and mole check.  The patient has spots, moles and lesions to be evaluated, some may be new or changing.  The following portions of the chart were reviewed this encounter and updated as appropriate:   Tobacco  Allergies  Meds  Problems  Med Hx  Surg Hx  Fam Hx     Review of Systems:  No other skin or systemic complaints except as noted in HPI or Assessment and Plan.  Objective  Well appearing patient in no apparent distress; mood and affect are within normal limits.  A full examination was performed including scalp, head, eyes, ears, nose, lips, neck, chest, axillae, abdomen, back, buttocks, bilateral upper extremities, bilateral lower extremities, hands, feet, fingers, toes, fingernails, and toenails. All findings within normal limits unless otherwise noted below.  R upper lip x 1 Erythematous thin papules/macules with gritty scale.    Assessment & Plan  AK (actinic keratosis) R upper lip x 1  Destruction of lesion - R upper lip x 1 Complexity: simple   Destruction method: cryotherapy   Informed consent: discussed and consent obtained   Timeout:  patient name, date of birth, surgical site, and procedure verified Lesion destroyed using liquid nitrogen: Yes   Region frozen until ice ball extended beyond lesion: Yes   Outcome: patient tolerated procedure well with no complications   Post-procedure details: wound care instructions given    History of Squamous Cell Carcinoma of the Skin - No evidence of recurrence today - No lymphadenopathy - Recommend regular full body skin exams - Recommend daily broad spectrum sunscreen SPF 30+ to sun-exposed areas, reapply every 2 hours as needed.  - Call if any new or changing lesions are noted between office visits  Lentigines -  Scattered tan macules - Due to sun exposure - Benign-appearing, observe - Recommend daily broad spectrum sunscreen SPF 30+ to sun-exposed areas, reapply every 2 hours as needed. - Call for any changes  Seborrheic Keratoses - Stuck-on, waxy, tan-brown papules and/or plaques  - Benign-appearing - Discussed benign etiology and prognosis. - Observe - Call for any changes  Melanocytic Nevi - Tan-brown and/or pink-flesh-colored symmetric macules and papules - Benign appearing on exam today - Observation - Call clinic for new or changing moles - Recommend daily use of broad spectrum spf 30+ sunscreen to sun-exposed areas.   Hemangiomas - Red papules - Discussed benign nature - Observe - Call for any changes  Actinic Damage - Chronic condition, secondary to cumulative UV/sun exposure - diffuse scaly erythematous macules with underlying dyspigmentation - Recommend daily broad spectrum sunscreen SPF 30+ to sun-exposed areas, reapply every 2 hours as needed.  - Staying in the shade or wearing long sleeves, sun glasses (UVA+UVB protection) and wide brim hats (4-inch brim around the entire circumference of the hat) are also recommended for sun protection.  - Call for new or changing lesions.  Purpura - Chronic; persistent and recurrent.  Treatable, but not curable. - Violaceous macules and patches - Benign - Related to trauma, age, sun damage and/or use of blood thinners, chronic use of topical and/or oral steroids - Observe - Can use OTC arnica containing moisturizer such as Dermend Bruise Formula if desired - Call for worsening or other concerns  Acrochordons (Skin Tags) - Fleshy, skin-colored pedunculated papules - Benign  appearing.  - Observe. - If desired, they can be removed with an in office procedure that is not covered by insurance. - Please call the clinic if you notice any new or changing lesions.  Milia - tiny firm white papules - type of cyst - benign - may be  extracted if symptomatic - observe  Skin cancer screening performed today.  Return in about 1 year (around 01/29/2023) for TBSE.  Luther Redo, CMA, am acting as scribe for Sarina Ser, MD . Documentation: I have reviewed the above documentation for accuracy and completeness, and I agree with the above.  Sarina Ser, MD

## 2022-02-07 ENCOUNTER — Encounter: Payer: Self-pay | Admitting: Dermatology

## 2022-02-12 ENCOUNTER — Other Ambulatory Visit: Payer: Self-pay | Admitting: Family Medicine

## 2022-02-12 DIAGNOSIS — Z170 Estrogen receptor positive status [ER+]: Secondary | ICD-10-CM

## 2022-02-12 NOTE — Telephone Encounter (Signed)
Refill request for ALPRAZOLAM 0.5 MG TAB   LOV - 10/30/21 Next OV - not scheduled Last refill - 12/12/21 #45/1

## 2022-02-27 ENCOUNTER — Encounter: Payer: Self-pay | Admitting: Family Medicine

## 2022-02-27 ENCOUNTER — Ambulatory Visit (INDEPENDENT_AMBULATORY_CARE_PROVIDER_SITE_OTHER): Payer: PPO | Admitting: Family Medicine

## 2022-02-27 VITALS — BP 122/58 | HR 59 | Temp 97.7°F | Ht 63.5 in | Wt 187.0 lb

## 2022-02-27 DIAGNOSIS — R609 Edema, unspecified: Secondary | ICD-10-CM

## 2022-02-27 DIAGNOSIS — I10 Essential (primary) hypertension: Secondary | ICD-10-CM

## 2022-02-27 LAB — BASIC METABOLIC PANEL
BUN: 25 mg/dL — ABNORMAL HIGH (ref 6–23)
CO2: 29 mEq/L (ref 19–32)
Calcium: 9.4 mg/dL (ref 8.4–10.5)
Chloride: 100 mEq/L (ref 96–112)
Creatinine, Ser: 0.91 mg/dL (ref 0.40–1.20)
GFR: 61.08 mL/min (ref >60.00)
Glucose, Bld: 100 mg/dL — ABNORMAL HIGH (ref 70–99)
Potassium: 3.7 mEq/L (ref 3.5–5.1)
Sodium: 138 mEq/L (ref 135–145)

## 2022-02-27 MED ORDER — AMLODIPINE BESYLATE 5 MG PO TABS: 2.5000 mg | ORAL_TABLET | Freq: Every day | ORAL | Status: DC

## 2022-02-27 NOTE — Progress Notes (Unsigned)
She had to put her dog down and that was a significant stressor for the patient.  D/w pt.  She is some better in the meantime.  She is trying to adjust to the change.  Ankle swelling.  She is on amlodipine.  Has been wearing support stockings for varicose veins at baseline.  No CP. Not SOB. Not bothersome BLE but noted in the last few weeks.  Not weeping.    Meds, vitals, and allergies reviewed.   ROS: Per HPI unless specifically indicated in ROS section   Nad Ncat Neck supple, no LA Rrr Ctab Abd soft, not ttp Trace BLE edema  Varicose veins B legs.    Discussed checking labs, given history of hypertension.

## 2022-02-27 NOTE — Patient Instructions (Signed)
Go to the lab on the way out.   If you have mychart we'll likely use that to update you.    Take care.  Glad to see you. Use compression stockings.  Try to limit salt.   If needed, try taking 1/2 tab of amlodipine and see if that helps the swelling.

## 2022-03-01 DIAGNOSIS — R609 Edema, unspecified: Secondary | ICD-10-CM | POA: Insufficient documentation

## 2022-03-01 NOTE — Assessment & Plan Note (Signed)
She eats a lot of prepared foods/food from restaurant.  Likely high sodium content.  Discussed limiting sodium content.  Use compression stockings.  If needed, try taking 1/2 tab of amlodipine and see if that helps the swelling.  Lungs clear and I do not suspect an ominous condition.  Venous stasis could contribute.  Discussed with patient.

## 2022-03-10 ENCOUNTER — Encounter: Payer: Self-pay | Admitting: Family

## 2022-03-10 ENCOUNTER — Telehealth: Payer: PPO | Admitting: Family

## 2022-03-11 ENCOUNTER — Encounter: Payer: Self-pay | Admitting: Family Medicine

## 2022-03-11 ENCOUNTER — Ambulatory Visit (INDEPENDENT_AMBULATORY_CARE_PROVIDER_SITE_OTHER): Payer: PPO | Admitting: Family Medicine

## 2022-03-11 VITALS — BP 130/78 | HR 56 | Temp 98.4°F | Ht 63.5 in | Wt 190.4 lb

## 2022-03-11 DIAGNOSIS — J0100 Acute maxillary sinusitis, unspecified: Secondary | ICD-10-CM

## 2022-03-11 MED ORDER — CLARITHROMYCIN 500 MG PO TABS: 500.0000 mg | ORAL_TABLET | Freq: Two times a day (BID) | ORAL | 0 refills | Status: DC

## 2022-03-11 NOTE — Progress Notes (Signed)
Shadavia Dampier T. Kariya Lavergne, MD, [facility] [place], [address]  Phone: [phone]  FAX: [phone]  Jamie Curry - 76 y.o. female  MRN [id number]  Date of Birth: Feb 17, 1946  Date: 03/11/2022  PCP: Tonia Ghent, MD  Referral: Tonia Ghent, MD  Chief Complaint  Patient presents with   Cough    Dry-X 2 weeks   Facial Pain        Nasal Congestion   Subjective:   This 76 y.o. female patient presents with runny nose, sneezing, cough, sore throat, malaise and minimal / low-grade fever for 2 weeks. Now the primary complaint has become sinus pressure and pain behind the eyes and in the upper, anterior face.   The patent denies sore throat as the primary complaint. Denies sthortness of breath/wheezing, high fever, chest pain, significant myalgia, otalgia, abdominal pain, changes in bowel or bladder.   Review of Systems is noted in the HPI, as appropriate  Objective:   Blood pressure 130/78, pulse (!) 56, temperature 98.4 F (36.9 C), temperature source Oral, height 5' 3.5" (1.613 m), weight 190 lb 6 oz (86.4 kg), SpO2 98 %.   Gen: WDWN, NAD; alert,appropriate and cooperative throughout exam  HEENT: Normocephalic and atraumatic. Throat clear, w/o exudate, no LAD, R TM clear, L TM - good landmarks, No fluid present. rhinnorhea.  Left frontal and maxillary sinuses: non-Tender Right frontal and maxillary sinuses: Tender  Neck: No ant or post LAD CV: RRR, No M/G/R Pulm: Breathing comfortably in no resp distress. no w/c/r Psych: full affect, pleasant  Assessment and Plan:     ICD-10-CM   1. Acute non-recurrent maxillary sinusitis  J01.00       ABX as below.   Reviewed symptomatic care as well as ABX in this case.    Follow-up: No follow-ups on file.  Meds ordered this encounter  Medications   clarithromycin (BIAXIN) 500 MG tablet    Sig: Take 1 tablet (500 mg total) by mouth 2 (two)  times daily.    Dispense:  20 tablet    Refill:  0   No orders of the defined types were placed in this encounter.   Signed,  Maud Deed. Talyn Eddie, MD  Patient's Medications  New Prescriptions   CLARITHROMYCIN (BIAXIN) 500 MG TABLET    Take 1 tablet (500 mg total) by mouth 2 (two) times daily.  Previous Medications   ACETAMINOPHEN (TYLENOL) 500 MG TABLET    Take 1,000 mg by mouth every 6 (six) hours as needed for mild pain.   ALBUTEROL (VENTOLIN HFA) 108 (90 BASE) MCG/ACT INHALER    Inhale into the lungs every 6 (six) hours as needed for wheezing or shortness of breath.   ALPRAZOLAM (XANAX) 0.5 MG TABLET    TAKE 1/2 TABLET BY MOUTH IN THE MORNING AND 1 TABLET IN THE EVENING IF NEEDED. SEDATION CAUTION   AMLODIPINE (NORVASC) 5 MG TABLET    Take 0.5-1 tablets (2.5-5 mg total) by mouth daily.   DICLOFENAC (VOLTAREN) 50 MG EC TABLET    Take 1 tablet (50 mg total) by mouth daily. As needed with food.   ESCITALOPRAM (LEXAPRO) 20 MG TABLET    Take 1 tablet (20 mg total) by mouth daily.   FLUTICASONE (FLONASE) 50 MCG/ACT NASAL SPRAY    Place into both nostrils.   HYDROCHLOROTHIAZIDE (HYDRODIURIL) 25 MG TABLET    TAKE 1 TABLET BY MOUTH ONCE A DAY   MONTELUKAST (  SINGULAIR) 10 MG TABLET    Take 1 tablet (10 mg total) by mouth at bedtime.   NYSTATIN POWDER    APPLY 1 APPLICATION TOPICALLY 3 TIMES DAILY   ROSUVASTATIN (CRESTOR) 10 MG TABLET    Take 1 tablet by mouth daily.  Modified Medications   No medications on file  Discontinued Medications   No medications on file

## 2022-03-16 ENCOUNTER — Ambulatory Visit
Admission: RE | Admit: 2022-03-16 | Discharge: 2022-03-16 | Disposition: A | Discharge status: Home / Self Care | Payer: PPO | Source: Ambulatory Visit | Attending: Oncology | Admitting: Oncology

## 2022-03-16 DIAGNOSIS — Z1231 Encounter for screening mammogram for malignant neoplasm of breast: Secondary | ICD-10-CM | POA: Diagnosis not present

## 2022-03-16 DIAGNOSIS — Z08 Encounter for follow-up examination after completed treatment for malignant neoplasm: Secondary | ICD-10-CM | POA: Insufficient documentation

## 2022-03-16 DIAGNOSIS — Z853 Personal history of malignant neoplasm of breast: Secondary | ICD-10-CM | POA: Diagnosis not present

## 2022-03-17 ENCOUNTER — Telehealth: Payer: Self-pay

## 2022-03-17 NOTE — Progress Notes (Signed)
Chronic Care Management Pharmacy Assistant   Name: Jamie Curry  MRN: [id number] DOB: 10-14-1945  Reason for Encounter: CCM (General Adherence)  Recent office visits:  02/27/22 Elsie Stain, MD HTN Change: Amlodipine 2.5-5 mg  Recent consult visits:  03/16/22 Mammogram 03/11/22 Owens Loffler, MD Acute non-recurrent maxillary sinusitis Start: Clarithromycin 500 mg 01/28/22 Sarina Ser, MD (Dermatology) Destruction of AK lesion  Hospital visits:  None in previous 6 months  Medications: Outpatient Encounter Medications as of 03/17/2022  Medication Sig   acetaminophen (TYLENOL) 500 MG tablet Take 1,000 mg by mouth every 6 (six) hours as needed for mild pain.   albuterol (VENTOLIN HFA) 108 (90 Base) MCG/ACT inhaler Inhale into the lungs every 6 (six) hours as needed for wheezing or shortness of breath.   ALPRAZolam (XANAX) 0.5 MG tablet TAKE 1/2 TABLET BY MOUTH IN THE MORNING AND 1 TABLET IN THE EVENING IF NEEDED. SEDATION CAUTION   amLODipine (NORVASC) 5 MG tablet Take 0.5-1 tablets (2.5-5 mg total) by mouth daily.   clarithromycin (BIAXIN) 500 MG tablet Take 1 tablet (500 mg total) by mouth 2 (two) times daily.   diclofenac (VOLTAREN) 50 MG EC tablet Take 1 tablet (50 mg total) by mouth daily. As needed with food.   escitalopram (LEXAPRO) 20 MG tablet Take 1 tablet (20 mg total) by mouth daily.   fluticasone (FLONASE) 50 MCG/ACT nasal spray Place into both nostrils.   hydrochlorothiazide (HYDRODIURIL) 25 MG tablet TAKE 1 TABLET BY MOUTH ONCE A DAY   montelukast (SINGULAIR) 10 MG tablet Take 1 tablet (10 mg total) by mouth at bedtime.   NYSTATIN powder APPLY 1 APPLICATION TOPICALLY 3 TIMES DAILY   rosuvastatin (CRESTOR) 10 MG tablet Take 1 tablet by mouth daily.   No facility-administered encounter medications on file as of 03/17/2022.    Recent vitals BP Readings from Last 3 Encounters:  03/11/22 130/78  03/10/22 135/68  02/27/22 (!) 122/58   Pulse Readings from  Last 3 Encounters:  03/11/22 (!) 56  03/10/22 60  02/27/22 (!) 59   Wt Readings from Last 3 Encounters:  03/11/22 190 lb 6 oz (86.4 kg)  03/10/22 187 lb (84.8 kg)  02/27/22 187 lb (84.8 kg)   BMI Readings from Last 3 Encounters:  03/11/22 33.19 kg/m  03/10/22 32.61 kg/m  02/27/22 32.61 kg/m    Recent lab results    Component Value Date/Time   NA 138 02/27/2022 1127   K 3.7 02/27/2022 1127   CL 100 02/27/2022 1127   CO2 29 02/27/2022 1127   GLUCOSE 100 (H) 02/27/2022 1127   BUN 25 (H) 02/27/2022 1127   CREATININE 0.91 02/27/2022 1127   CALCIUM 9.4 02/27/2022 1127    Lab Results  Component Value Date   CREATININE 0.91 02/27/2022   GFR 61.08 02/27/2022   GFRNONAA >60 06/07/2020   GFRAA >60 05/16/2019   Lab Results  Component Value Date/Time   HGBA1C (H) 06/18/2010 06:32 PM    6.0 (NOTE)                                                                       According to the ADA Clinical Practice Recommendations for 2011, when HbA1c is used as a screening test:   >=6.5%  Diagnostic of Diabetes Mellitus           (if abnormal result  is confirmed)  5.7-6.4%   Increased risk of developing Diabetes Mellitus  References:Diagnosis and Classification of Diabetes Mellitus,Diabetes LMBE,6754,49(EEFEO 1):S62-S69 and Standards of Medical Care in         Diabetes - 2011,Diabetes Care,2011,34  (Suppl 1):S11-S61.    Lab Results  Component Value Date   CHOL 237 (H) 09/01/2021   HDL 72.80 09/01/2021   LDLCALC 136 (H) 09/01/2021   LDLDIRECT 161.2 11/17/2011   TRIG 141.0 09/01/2021   CHOLHDL 3 09/01/2021    Attempted contact with patient 3 times. Unsuccessful outreach. Will atttempt contact next month.  Since last visit with CPP, the following interventions have been made.  Change: Amlodipine 2.5-5 mg  Care Gaps: Annual wellness visit in last year? Yes 12/12/2021 Most Recent BP reading: 130/78 on 03/11/2022   Star Rating Drugs:  Medication:  Last Fill: Day  Supply Rosuvastatin 10 mg 12/22/21 90  Summary of recommendations from last [place] visit (Date:12/25/2021)  Summary: CCM Initial visit -Reviewed medications; pt affirms compliance as precribed -HLD: LDL 136 (08/2021) while pt was not adherent to statin; she affirms adherence to rosuvastatin 10 mg daily today -Anxiety: pt voiced interest in seeing behavioral health   Recommendations/Changes made from today's visit: -Repeat lipid panel at next OV -Provided phone number to schedule behavioral health appt   Plan: -[place] will call patient 3 months for general update -Pharmacist follow up televisit scheduled for 6 months -PCP annual visit due May 2024   Upcoming appointments: CCM appointment on 06/25/2022  Charlene Brooke, CPP notified  Marijean Niemann, [place] Assistant [phone]

## 2022-03-19 ENCOUNTER — Telehealth: Payer: Self-pay | Admitting: Family Medicine

## 2022-03-19 NOTE — Telephone Encounter (Signed)
Ms. Miano notified as instructed by telephone.  She will continue taking the antibiotic as prescribed.

## 2022-03-19 NOTE — Telephone Encounter (Signed)
Patient called and stated she started getting a headache yesterday and she wanted to know if it was due to the medication clarithromycin (BIAXIN) 500 MG tablet. Call back number [phone]

## 2022-03-22 NOTE — Progress Notes (Signed)
cancelled

## 2022-03-23 ENCOUNTER — Other Ambulatory Visit: Payer: Self-pay | Admitting: Family Medicine

## 2022-03-23 DIAGNOSIS — Z170 Estrogen receptor positive status [ER+]: Secondary | ICD-10-CM

## 2022-03-23 NOTE — Telephone Encounter (Signed)
Refill request for ALPRAZOLAM 0.5 MG TAB   LOV - 03/11/22 with Dr. Lorelei Pont Next OV - not scheduled Last refill - 02/13/22 #45/0

## 2022-04-10 ENCOUNTER — Ambulatory Visit (INDEPENDENT_AMBULATORY_CARE_PROVIDER_SITE_OTHER): Payer: PPO | Admitting: Family Medicine

## 2022-04-10 ENCOUNTER — Telehealth: Payer: Self-pay | Admitting: Family Medicine

## 2022-04-10 ENCOUNTER — Encounter: Payer: Self-pay | Admitting: Family Medicine

## 2022-04-10 VITALS — BP 122/65 | HR 65 | Temp 98.2°F | Ht 63.5 in | Wt 188.0 lb

## 2022-04-10 DIAGNOSIS — J069 Acute upper respiratory infection, unspecified: Secondary | ICD-10-CM | POA: Insufficient documentation

## 2022-04-10 DIAGNOSIS — L989 Disorder of the skin and subcutaneous tissue, unspecified: Secondary | ICD-10-CM

## 2022-04-10 DIAGNOSIS — M1710 Unilateral primary osteoarthritis, unspecified knee: Secondary | ICD-10-CM

## 2022-04-10 DIAGNOSIS — Z853 Personal history of malignant neoplasm of breast: Secondary | ICD-10-CM

## 2022-04-10 DIAGNOSIS — H9190 Unspecified hearing loss, unspecified ear: Secondary | ICD-10-CM

## 2022-04-10 MED ORDER — CLARITHROMYCIN 250 MG PO TABS: 250.0000 mg | ORAL_TABLET | Freq: Two times a day (BID) | ORAL | 0 refills | Status: DC

## 2022-04-10 MED ORDER — CLARITHROMYCIN 250 MG PO TABS: 250.0000 mg | ORAL_TABLET | Freq: Every day | ORAL | 0 refills | Status: DC

## 2022-04-10 NOTE — Patient Instructions (Signed)
Try taking biaxin '250mg'$  twice a day and see if you can tolerate that.  Rest and fluids.  Keep using flonase.  Update [place] as needed.  Take care.  Glad to see you.

## 2022-04-10 NOTE — Assessment & Plan Note (Signed)
Not allergic to biaxin and limited options o/w. D/w pt.   Can try taking biaxin '250mg'$  twice a day and see if she can tolerate that.  Rest and fluids.  Keep using flonase.  Update [place] as needed.

## 2022-04-10 NOTE — Progress Notes (Signed)
Will need referrals done after 04/13/22.   [facility] dermatology.   Jamie Curry with hematology.   [facility]  D/w pt.  Will address after 04/13/22.   URI sx.  She had HA after taking biaxin.  Stopped med after 3 days.  No rash.  Still stuffy, some cough.  Some post nasal gtt.  No wheeze.  Can still take a deep breath.  No jaw pain.  Allergy list d/w pt.  She has been using flonase at baseline.  She is some better in the meantime.    Meds, vitals, and allergies reviewed.   ROS: Per HPI unless specifically indicated in ROS section   GEN: nad, alert and oriented HEENT: mucous membranes moist, tm w/o erythema, max sinuses ttp B,  OP with cobblestoning NECK: supple w/o LA CV: rrr.   PULM: ctab, no inc wob EXT: no edema SKIN: well perfused.

## 2022-04-10 NOTE — Telephone Encounter (Signed)
Will need referrals done after 04/13/22.   [facility] dermatology.   Janese Banks with hematology.   [facility]

## 2022-04-13 NOTE — Telephone Encounter (Signed)
Referrals placed for 2024.

## 2022-04-20 ENCOUNTER — Other Ambulatory Visit: Payer: Self-pay | Admitting: Family Medicine

## 2022-04-20 DIAGNOSIS — Z170 Estrogen receptor positive status [ER+]: Secondary | ICD-10-CM

## 2022-04-20 NOTE — Telephone Encounter (Signed)
Refill request for ALPRAZOLAM 0.5 MG TAB   LOV - 04/10/22 Next OV - not scheduled Last refill - 03/24/22 #45/0

## 2022-05-01 ENCOUNTER — Telehealth: Payer: Self-pay | Admitting: Family Medicine

## 2022-05-01 NOTE — Telephone Encounter (Signed)
Sonia Side from [facility] called stating a prior authorization is needed for the meds, ALPRAZolam (XANAX) 0.5 MG tablet (30 day supply. Call back # [phone]

## 2022-05-04 ENCOUNTER — Ambulatory Visit (INDEPENDENT_AMBULATORY_CARE_PROVIDER_SITE_OTHER): Payer: Medicare HMO | Admitting: Orthopaedic Surgery

## 2022-05-04 ENCOUNTER — Ambulatory Visit (INDEPENDENT_AMBULATORY_CARE_PROVIDER_SITE_OTHER): Payer: Medicare HMO

## 2022-05-04 ENCOUNTER — Ambulatory Visit: Payer: Self-pay

## 2022-05-04 ENCOUNTER — Other Ambulatory Visit: Payer: Self-pay

## 2022-05-04 DIAGNOSIS — G8929 Other chronic pain: Secondary | ICD-10-CM | POA: Diagnosis not present

## 2022-05-04 DIAGNOSIS — M25562 Pain in left knee: Secondary | ICD-10-CM

## 2022-05-04 DIAGNOSIS — M25561 Pain in right knee: Secondary | ICD-10-CM

## 2022-05-04 NOTE — Progress Notes (Signed)
The patient is a pleasant 77 year old female that we have been seen in about 2 years now.  At that time was for left knee pain after tripping over her dog's leash.  She tries to stay active.  She said her dog did pass away about 4 months ago.  She says her knees do not really hurt but they do not feel strong and stable to her.  She has had no known or new injuries or current active medical issues.  Examination of both knees show some patellofemoral crepitation worse on the left than the right.  Both knees have just slight varus malalignment but no effusion.  Both knees are ligamentously stable with good range of motion.  An AP and lateral of both knees shows slight medial joint space narrowing and patellofemoral narrowing of both knees.  She is someone who would definitely benefit from outpatient physical therapy to really work on her balance and coordination and strengthening both knees.  She agrees to having this set up hopefully at [facility] that she lives closer to.  From my standpoint follow-up can be as needed unless things worsen.  She agrees with this treatment plan.  All questions and concerns were answered addressed.

## 2022-05-07 ENCOUNTER — Encounter: Payer: Self-pay | Admitting: Family Medicine

## 2022-05-07 ENCOUNTER — Other Ambulatory Visit (HOSPITAL_COMMUNITY): Payer: Self-pay

## 2022-05-07 ENCOUNTER — Ambulatory Visit (INDEPENDENT_AMBULATORY_CARE_PROVIDER_SITE_OTHER): Payer: Medicare HMO | Admitting: Family Medicine

## 2022-05-07 VITALS — BP 132/60 | HR 84 | Temp 100.9°F | Ht 63.5 in | Wt 190.2 lb

## 2022-05-07 DIAGNOSIS — R051 Acute cough: Secondary | ICD-10-CM | POA: Diagnosis not present

## 2022-05-07 DIAGNOSIS — U071 COVID-19: Secondary | ICD-10-CM | POA: Diagnosis not present

## 2022-05-07 DIAGNOSIS — J4520 Mild intermittent asthma, uncomplicated: Secondary | ICD-10-CM

## 2022-05-07 LAB — POC INFLUENZA A&B (BINAX/QUICKVUE)
Influenza A, POC: NEGATIVE
Influenza B, POC: NEGATIVE

## 2022-05-07 LAB — POC COVID19 BINAXNOW: SARS Coronavirus 2 Ag: POSITIVE — AB

## 2022-05-07 MED ORDER — NIRMATRELVIR/RITONAVIR (PAXLOVID)TABLET: 3.0000 | ORAL_TABLET | Freq: Two times a day (BID) | ORAL | 0 refills | Status: AC

## 2022-05-07 NOTE — Progress Notes (Signed)
Jamie Stracener T. Airianna Kreischer, MD, [facility] [place], [address]  Phone: [phone]  FAX: [phone]  Jamie Curry - 77 y.o. female  MRN [id number]  Date of Birth: 1945/12/27  Date: 05/07/2022  PCP: Tonia Ghent, MD  Referral: Tonia Ghent, MD  Chief Complaint  Patient presents with   Shortness of Breath        Sneezing   Cough   Sore Throat   Dizziness    Symptoms started ? Monday No Covid Test   Generalized Body Aches    Legs were achy last night and this morning   Subjective:   Jamie Curry is a 77 y.o. very pleasant female patient with Body mass index is 33.17 kg/m. who presents with the following:  She is a very pleasant 77 year old patient with asthma and she presents with acute COVID-19.  While she did have a lingering illness in December, starting around Monday or Tuesday she started to feel quite bad.  She has generalized body aching and diffuse polyarthralgia.  She also has a temperature of 101 right now. She also has a very significant sore throat, sneezing, she is somewhat short of breath, and she is coughing a lot.  She is not wheezing. She also has felt somewhat dizzy.  No nausea, vomiting, diarrhea. She is not sure about taste or smell changes.  This week did not really feel good.  Legs were hurting, and she was coughing a lot of the time.    Does not really want to eat much.   Covid-19  Review of Systems is noted in the HPI, as appropriate  Objective:   BP 132/60   Pulse 84   Temp (!) 100.9 F (38.3 C) (Oral)   Ht 5' 3.5" (1.613 m)   Wt 190 lb 4 oz (86.3 kg)   SpO2 96%   BMI 33.17 kg/m    Gen: WDWN, NAD. Globally Non-toxic HEENT: Throat clear, w/o exudate, R TM clear, L TM - good landmarks, No fluid present. rhinnorhea.  MMM Frontal sinuses: NT Max sinuses: NT NECK: Anterior cervical  LAD is absent CV: RRR, No M/G/R, cap refill <2 sec PULM: Breathing  comfortably in no respiratory distress. no wheezing, crackles, rhonchi   Laboratory and Imaging Data:  Assessment and Plan:     ICD-10-CM   1. COVID-19  U07.1 nirmatrelvir/ritonavir (PAXLOVID) 20 x 150 MG & 10 x '100MG'$  TABS    2. Acute cough  R05.1 POC COVID-19    POC Influenza A&B (Binax test)    3. Mild intermittent asthma without complication  L79.89      COVID-21 in a 77 year old with asthma.  I think that we need to be conservative and treat the patient with Paxlovid.  He will continue with other supportive care, and take Tylenol for achiness and fever.  Albuterol as needed for shortness of breath or wheezing.  Medication Management during today's office visit: Meds ordered this encounter  Medications   nirmatrelvir/ritonavir (PAXLOVID) 20 x 150 MG & 10 x '100MG'$  TABS    Sig: Take 3 tablets by mouth 2 (two) times daily for 5 days. (Take nirmatrelvir 150 mg two tablets twice daily for 5 days and ritonavir 100 mg one tablet twice daily for 5 days) Patient GFR is 60.    Dispense:  30 tablet    Refill:  0   Medications Discontinued During This Encounter  Medication Reason   clarithromycin (  BIAXIN) 250 MG tablet Completed Course    Orders placed today for conditions managed today: Orders Placed This Encounter  Procedures   POC COVID-19   POC Influenza A&B (Binax test)    Disposition: No follow-ups on file.  [employer] One speech-to-text software was used for transcription in this dictation.  Possible transcriptional errors can occur using Editor, commissioning.   Signed,  Maud Deed. Alexandrya Chim, MD   Outpatient Encounter Medications as of 05/07/2022  Medication Sig   acetaminophen (TYLENOL) 500 MG tablet Take 1,000 mg by mouth every 6 (six) hours as needed for mild pain.   albuterol (VENTOLIN HFA) 108 (90 Base) MCG/ACT inhaler Inhale into the lungs every 6 (six) hours as needed for wheezing or shortness of breath.   ALPRAZolam (XANAX) 0.5 MG tablet TAKE 1/2 TABLET BY MOUTH  IN THE MORNING AND 1 TABLET IN THE EVENING IF NEEDED. SEDATION CAUTION   amLODipine (NORVASC) 5 MG tablet Take 0.5-1 tablets (2.5-5 mg total) by mouth daily.   diclofenac (VOLTAREN) 50 MG EC tablet Take 1 tablet (50 mg total) by mouth daily. As needed with food.   escitalopram (LEXAPRO) 20 MG tablet Take 1 tablet (20 mg total) by mouth daily.   fluticasone (FLONASE) 50 MCG/ACT nasal spray Place into both nostrils.   hydrochlorothiazide (HYDRODIURIL) 25 MG tablet TAKE 1 TABLET BY MOUTH ONCE A DAY   montelukast (SINGULAIR) 10 MG tablet Take 1 tablet (10 mg total) by mouth at bedtime.   nirmatrelvir/ritonavir (PAXLOVID) 20 x 150 MG & 10 x '100MG'$  TABS Take 3 tablets by mouth 2 (two) times daily for 5 days. (Take nirmatrelvir 150 mg two tablets twice daily for 5 days and ritonavir 100 mg one tablet twice daily for 5 days) Patient GFR is 60.   NYSTATIN powder APPLY 1 APPLICATION TOPICALLY 3 TIMES DAILY   rosuvastatin (CRESTOR) 10 MG tablet TAKE 1 TABLET BY MOUTH EVERY NIGHT AT BEDTIME   [DISCONTINUED] clarithromycin (BIAXIN) 250 MG tablet Take 1 tablet (250 mg total) by mouth 2 (two) times daily.   No facility-administered encounter medications on file as of 05/07/2022.

## 2022-05-07 NOTE — Telephone Encounter (Signed)
Pharmacy Patient Advocate Encounter  Received notification from [facility]  that prior authorization for ALPRAZolam is required/requested.  Per Test Claim: ALPRAZolam is cover at $0 copay   Spoke with Pharmacy to process.  Sandre Kitty Rx Patient Advocate

## 2022-05-07 NOTE — Patient Instructions (Signed)
Please stop your CRESTOR / ROSUVASTATIN for the next 10 days

## 2022-05-08 ENCOUNTER — Telehealth: Payer: Self-pay | Admitting: Family Medicine

## 2022-05-08 MED ORDER — LOPERAMIDE HCL 2 MG PO TABS: 2.0000 mg | ORAL_TABLET | Freq: Four times a day (QID) | ORAL | Status: AC | PRN

## 2022-05-08 NOTE — Telephone Encounter (Signed)
Per chart review tab pt was seen by Dr Lorelei Pont on 05/07/22. Dr Lorelei Pont is out of office today and sending note to Dr Damita Dunnings who is PCP and is in office today and Damita Dunnings pool

## 2022-05-08 NOTE — Telephone Encounter (Signed)
Spoke with patient and advised to take imdium as needed and if needs to can skip a dose of the paxloid. Patient verbalized understanding.

## 2022-05-08 NOTE — Telephone Encounter (Signed)
Patient called and stated she got diagnosed if covid and got paxlovid and has loose biles and stomach been rolling. Patient was sent to access nurse.

## 2022-05-08 NOTE — Telephone Encounter (Signed)
Could try imodium prn, could try skipping one dose of paxlovid if needed.  Rest and fluids are reasonable in the meantime.  Thanks.

## 2022-05-08 NOTE — Telephone Encounter (Signed)
[place] [facility] - Client TELEPHONE ADVICE RECORD Access[facility]urse Patient Name: [facility] Gender: Female DOB: 03-29-1946 Age: 77 Y 2 M 1 D Return Phone Number: [phone] (Primary) Address[place]  [address] Client [facility] - Client Client Site [place] - Day Provider AA - PHYSICIAN, NOT LISTED- MD Contact Type Call Who Is Calling Patient / Member / Family / Caregiver Call Type Triage / Clinical Relationship To Patient Self Return Phone Number [phone] (Primary) Chief Complaint Medication reaction Reason for Call Symptomatic / Request for Health Information Initial Comment Patient on the line has COVID, doctor gave her Paxlovid and she started having loose bowels. The medication does seem to help. Additional Comment The first person on the line, the caller, mentioned stomach pain, but the patient herself didn't mention it when I asked about symptoms. Translation No Nurse Assessment Nurse: Cox, RN, Holly Date/Time (Eastern Time): 05/08/2022 3:03:37 PM Confirm and document reason for call. If symptomatic, describe symptoms. ---Patient on the line has COVID, doctor gave her Paxlovid and she started having loose bowels. Caller reports that she started having symptoms and then saw her doctor yesterday and was given Paxlovid and then started having diarrhea. Does the patient have any new or worsening symptoms? ---Yes Will a triage be completed? ---Yes Related visit to physician within the last 2 weeks? ---Yes Does the PT have any chronic conditions? (i.e. diabetes, asthma, this includes High risk factors for pregnancy, etc.) ---Yes List chronic conditions. ---Asthma, HTN, high cholesterol Is this a behavioral health or substance abuse call? ---No Guidelines Guideline Title Affirmed Question Affirmed Notes Nurse Date/Time (Eastern Time) Diarrhea  MILD-MODERATE diarrhea (e.g., 1-6 times / day more than normal) Cox, RN, [facility] 05/08/2022 3:06:53 PM PLEASE NOTE: All timestamps contained within this report are represented as [place] Standard Time. CONFIDENTIALTY NOTICE: This fax transmission is intended only for the addressee. It contains information that is legally privileged, confidential or otherwise protected from use or disclosure. If you are not the intended recipient, you are strictly prohibited from reviewing, disclosing, copying using or disseminating any of this information or taking any action in reliance on or regarding this information. If you have received this fax in error, please notify us immediately by telephone so that we can arrange for its return to [place]. Phone: [phone], Toll-Free: [phone], Fax: [phone] Page: 2 of 2 Call Id: [id number] [place]. Time Eilene Ghazi Time) Disposition Final User 05/08/2022 3:14:13 PM Home Care Yes Cox, RN, [facility] Final Disposition 05/08/2022 3:14:13 PM Home Care Yes Cox, RN, Earnest Bailey Caller Disagree/Comply Comply Caller Understands Yes PreDisposition Home Care Care Advice Given Per Guideline HOME CARE: * You should be able to treat this at home. REASSURANCE AND EDUCATION - DIARRHEA: * Diarrhea may be caused by a virus ('stomach flu') or a bacteria. Diarrhea is one of the body's way of getting rid of germs. * Staying well-hydrated is the most important thing if you have diarrhea. From what you have told me, it sounds like you are not severely dehydrated at this point. * WATER: For mild to moderate diarrhea, water is often the best liquid to drink. You should also eat some salty foods (e.g., potato chips, pretzels, saltine crackers). This is important to make sure you are getting enough salt, sugars, and fluids to meet your body's needs. [place] YOUR HANDS: * Wash your hands after using the bathroom. * Wash your hands before fixing or eating food. EXPECTED COURSE: *  Viral diarrhea lasts 4 to 7  days. * It is usually worse on days 1 and 2. CALL BACK IF: * Signs of dehydration occur (e.g., no urine over 12 hours, very dry mouth, lightheaded, etc.) * Diarrhea lasts over 7 days * You become worse CARE ADVICE given per Diarrhea (Adult) guideline. Comments User: Tommy Medal, RN Date/Time Eilene Ghazi Time): 05/08/2022 3:14:12 PM [email or web address] Possible that the diarrhea is a side effect of the Paxlovid OR a symptom of her COVID

## 2022-05-11 ENCOUNTER — Encounter: Payer: Self-pay | Admitting: Family Medicine

## 2022-05-11 NOTE — Telephone Encounter (Signed)
Called patient she is picking up imodium today. She will take the Paxlovid this afternoon and start on the imodium as well. Will let office know if no improvement in symptoms.

## 2022-05-11 NOTE — Telephone Encounter (Signed)
error 

## 2022-05-11 NOTE — Telephone Encounter (Signed)
Patient called in and stated she hasn't gotten the Imodium yet but she skipped her dose of paxlovid this morning. She was wanting to know if she should take her dose at 4:00PM. If She does get it later today she will take the imodium.

## 2022-05-13 ENCOUNTER — Telehealth: Payer: Self-pay | Admitting: Family Medicine

## 2022-05-13 NOTE — Telephone Encounter (Signed)
Patient was diagnosed with covid on 05/07/22,she called in today stating that she have been feeling weak and shaky and feeling like she's about to pass out. I sent her to access nurse to be triaged.

## 2022-05-14 ENCOUNTER — Encounter: Payer: Self-pay | Admitting: Primary Care

## 2022-05-14 ENCOUNTER — Ambulatory Visit (INDEPENDENT_AMBULATORY_CARE_PROVIDER_SITE_OTHER): Payer: Medicare HMO | Admitting: Primary Care

## 2022-05-14 VITALS — BP 146/66 | HR 70 | Temp 98.2°F | Ht 63.5 in | Wt 187.0 lb

## 2022-05-14 DIAGNOSIS — U071 COVID-19: Secondary | ICD-10-CM | POA: Insufficient documentation

## 2022-05-14 LAB — BASIC METABOLIC PANEL
BUN: 15 mg/dL (ref 6–23)
CO2: 31 mEq/L (ref 19–32)
Calcium: 9.1 mg/dL (ref 8.4–10.5)
Chloride: 89 mEq/L — ABNORMAL LOW (ref 96–112)
Creatinine, Ser: 0.83 mg/dL (ref 0.40–1.20)
GFR: 68.11 mL/min (ref >60.00)
Glucose, Bld: 113 mg/dL — ABNORMAL HIGH (ref 70–99)
Potassium: 3.4 mEq/L — ABNORMAL LOW (ref 3.5–5.1)
Sodium: 129 mEq/L — ABNORMAL LOW (ref 135–145)

## 2022-05-14 LAB — CBC WITH DIFFERENTIAL/PLATELET
Basophils Absolute: 0 10*3/uL (ref 0.0–0.1)
Basophils Relative: 0.4 % (ref 0.0–3.0)
Eosinophils Absolute: 0 10*3/uL (ref 0.0–0.7)
Eosinophils Relative: 0.5 % (ref 0.0–5.0)
HCT: 36.5 % (ref 36.0–46.0)
Hemoglobin: 12.5 g/dL (ref 12.0–15.0)
Lymphocytes Relative: 26.1 % (ref 12.0–46.0)
Lymphs Abs: 1.2 10*3/uL (ref 0.7–4.0)
MCHC: 34.3 g/dL (ref 30.0–36.0)
MCV: 88.3 fl (ref 78.0–100.0)
Monocytes Absolute: 0.5 10*3/uL (ref 0.1–1.0)
Monocytes Relative: 10.7 % (ref 3.0–12.0)
Neutro Abs: 2.9 10*3/uL (ref 1.4–7.7)
Neutrophils Relative %: 62.3 % (ref 43.0–77.0)
Platelets: 207 10*3/uL (ref 150.0–400.0)
RBC: 4.13 Mil/uL (ref 3.87–5.11)
RDW: 13.6 % (ref 11.5–15.5)
WBC: 4.6 10*3/uL (ref 4.0–10.5)

## 2022-05-14 MED ORDER — BENZONATATE 200 MG PO CAPS: 200.0000 mg | ORAL_CAPSULE | Freq: Three times a day (TID) | ORAL | 0 refills | Status: DC | PRN

## 2022-05-14 NOTE — Assessment & Plan Note (Signed)
Improving. Exam today reassuring.  Discussed that Covid-19 symptoms may linger for days, to weeks, and to months.  Checking BMP and CBC today given her generalized weakness and poor oral intake of food/liquids.  Rx for [employer] provided to use PRN Discussed to consider famotidine 20 mg HS given cough with meals and sleep.  Return precautions provided.

## 2022-05-14 NOTE — Telephone Encounter (Signed)
Per appt notes pt already has appt scheduled with Gentry Fitz NP on 05/14/22 at 12:20. Sending note to Gentry Fitz NP and Carlis Abbott pool.

## 2022-05-14 NOTE — Patient Instructions (Signed)
Stop by the lab prior to leaving today. I will notify you of your results once received.   You may take Benzonatate capsules for cough. Take 1 capsule by mouth three times daily as needed for cough.  Continue to work on hydration and increase your intake of food slowly as tolerated.  Consider starting famotidine 20 mg at bedtime for cough and heartburn.   It was a pleasure to see you today!

## 2022-05-14 NOTE — Telephone Encounter (Signed)
[place] Day - Client TELEPHONE ADVICE RECORD [facility] Patient Name: [facility] Gender: Female DOB: July 05, 1945 Age: 77 Y 2 M 6 D Return Phone Number: [phone] (Primary), [phone] (Secondary) Address: [place]  [address] Client [place] [facility] - Client Client Site [place] - Day Provider Renford Dills - MD Contact Type Call Who Is Calling Patient / Member / Family / Caregiver Call Type Triage / Clinical Relationship To Patient Self Return Phone Number [phone] (Secondary) Chief Complaint FAINTING or [place] Reason for Call Symptomatic / Request for [place] states she was diagnosed with Covid. She also states she has weakness,shaky and feels like she is about to pass out. Translation No Nurse Assessment Nurse: Ahorlu, RN, Shellia Cleverly Date/Time (Eastern Time): 05/13/2022 5:02:22 PM Confirm and document reason for call. If symptomatic, describe symptoms. ---Caller states she was diagnosed with Covid. She also states she has weakness,shaky and feels like she is about to pass out for the last few days. Does the patient have any new or worsening symptoms? ---Yes Will a triage be completed? ---Yes Related visit to physician within the last 2 weeks? ---Yes Does the PT have any chronic conditions? (i.e. diabetes, asthma, this includes High risk factors for pregnancy, etc.) ---Yes List chronic conditions. ---hypertension Is this a behavioral health or substance abuse call? ---No Guidelines Guideline Title Affirmed Question Affirmed Notes Nurse Date/Time (Eastern Time) Weakness (Generalized) and Fatigue Taking a medicine that could cause weakness (e.g., blood pressure medications, diuretics) Ahorlu, RN, Shellia Cleverly 05/13/2022 5:06:26 PM Disp. Time Eilene Ghazi Time) Disposition Final User 05/13/2022 5:00:53 PM Send to Urgent  Gomez Cleverly 05/13/2022 5:12:42 PM See PCP within 24 Hours Yes Ahorlu, RN, Shellia Cleverly PLEASE NOTE: All timestamps contained within this report are represented as [place] Standard Time. CONFIDENTIALTY NOTICE: This fax transmission is intended only for the addressee. It contains information that is legally privileged, confidential or otherwise protected from use or disclosure. If you are not the intended recipient, you are strictly prohibited from reviewing, disclosing, copying using or disseminating any of this information or taking any action in reliance on or regarding this information. If you have received this fax in error, please notify us immediately by telephone so that we can arrange for its return to [place]. Phone: [phone], Toll-Free: [phone], Fax: [phone] Page: 2 of 2 Call Id: [id number] Final Disposition 05/13/2022 5:12:42 PM See PCP within 24 Hours Yes Ahorlu, RN, [facility] Disagree/Comply Comply Caller Understands Yes PreDisposition Home Care Care Advice Given Per Guideline SEE PCP WITHIN 24 HOURS: * IF OFFICE WILL BE OPEN: You need to be examined within the next 24 hours. Call your doctor (or NP/PA) when the office opens and make an appointment. CALL BACK IF: * You become worse CARE ADVICE given per Weakness and Fatigue (Adult) guideline. Referrals REFERRED TO PCP OFFIC

## 2022-05-14 NOTE — Progress Notes (Signed)
Subjective:    Patient ID: Jamie Curry, female    DOB: [birth year], 77 y.o.   MRN: [id number]  HPI  Jamie Curry is a very pleasant 77 y.o. female patient of Dr. Damita Dunnings with a history of asthma, hypertension, hyperlipidemia, breast cancer, anxiety who presents today to discuss  Evaluated by PCP on 04/10/2022 for URI symptoms.  She was prescribed Biaxin 250 mg twice a day.  Evaluated last week by Dr. Lorelei Pont for lingering symptoms of bodyaches, shortness of breath, cough since late December 2023.  During this visit her symptoms had increased with diffuse body aches, fever of 101, persistent cough, other URI symptoms.  She tested positive for COVID-19 infection and was treated with Paxlovid course.  Today she continues to experience symptoms from Covid-19 infection including sore throat, cough, nausea, poor appetite, generalized weakness.  She completed her Paxlovid course 3 days ago.    She has been taking Delsym for her cough which has helped temporarily. She does experience coughing spells at times, especially with eating or at night. Her most bothersome symptom is her sore throat and difficulty sleeping. Overall she's feeling better compared to a few days ago. This morning she was able to eat a full bowl of cereal without difficulty.   She denies fevers, esophageal burning, shortness of breath. She is concerned that she may have an electrolyte imbalance. Her neighbor recently had Covid and her potassium was low.    Review of Systems  Constitutional:  Positive for fatigue.  HENT:  Positive for sore throat.   Respiratory:  Positive for cough.   Gastrointestinal:  Positive for nausea.         Past Medical History:  Diagnosis Date   Anxiety    Asthma    Breast cancer ([place]) 02/07/2016   left breast invasive lobular carcinoma   CAD (coronary artery disease)    mild CAD by cath in 2001   Depression    Diverticulosis    Heart murmur    HTN (hypertension)    Hyperlipidemia     Palpitations    Personal history of radiation therapy 2018   left breast ca   Squamous cell carcinoma of skin 12/09/2017   R lat base of neck, SCCIS   Syncope    Pre-syncope    Social History   Socioeconomic History   Marital status: Married    Spouse name: Not on file   Number of children: Not on file   Years of education: Not on file   Highest education level: Not on file  Occupational History   Not on file  Tobacco Use   Smoking status: Never   Smokeless tobacco: Never  Substance and Sexual Activity   Alcohol use: No    Alcohol/week: 0.0 standard drinks of alcohol   Drug use: No   Sexual activity: Not on file  Other Topics Concern   Not on file  Social History Narrative   Lives alone with her dog.  Divorced.     No kids    Prev worked assembly work.  Retired at age 30.    Social Determinants of Health   Financial Resource Strain: Low Risk  (12/12/2021)   Overall Financial Resource Strain (CARDIA)    Difficulty of Paying Living Expenses: Not very hard  Food Insecurity: No Food Insecurity (12/12/2021)   Hunger Vital Sign    Worried About Running Out of Food in the Last Year: Never true    Ran Out of Food in  the Last Year: Never true  Transportation Needs: No Transportation Needs (12/12/2021)   PRAPARE - Hydrologist (Medical): No    Lack of Transportation (Non-Medical): No  Physical Activity: Insufficiently Active (12/12/2021)   Exercise Vital Sign    Days of Exercise per Week: 3 days    Minutes of Exercise per Session: 30 min  Stress: No Stress Concern Present (12/12/2021)   [place]    Feeling of Stress : Only a little  Social Connections: Moderately Isolated (12/12/2021)   Social Connection and Isolation Panel [NHANES]    Frequency of Communication with Friends and Family: More than three times a week    Frequency of Social Gatherings with Friends and Family: Twice a  week    Attends Religious Services: More than 4 times per year    Active Member of [employer] or Organizations: No    Attends Archivist Meetings: Never    Marital Status: Divorced  Human resources officer Violence: Not At Risk (12/12/2021)   Humiliation, Afraid, Rape, and Kick questionnaire    Fear of Current or Ex-Partner: No    Emotionally Abused: No    Physically Abused: No    Sexually Abused: No    Past Surgical History:  Procedure Laterality Date   ABDOMINAL HYSTERECTOMY     BREAST BIOPSY Left 02/07/2016   grade II invasive and in situ mammary carcinoma   BREAST CYST ASPIRATION Left 02/13/2016   cyst   BREAST LUMPECTOMY Left 03/03/2016   invasive lobular carcinoma, clear margins, METASTATIC LOBULAR CARCINOMA, 2.5 MM, IN ONE LYMPH NODE (1/1).    CHOLECYSTECTOMY     KNEE ARTHROSCOPY Right    PARTIAL MASTECTOMY WITH NEEDLE LOCALIZATION Left 03/03/2016   Procedure: PARTIAL MASTECTOMY WITH NEEDLE LOCALIZATION;  Surgeon: Leonie Green, MD;  Location: [facility];  Service: General;  Laterality: Left;   SENTINEL NODE BIOPSY Left 03/03/2016   Procedure: SENTINEL NODE BIOPSY;  Surgeon: Leonie Green, MD;  Location: [facility];  Service: General;  Laterality: Left;   TUBAL LIGATION      Family History  Problem Relation Age of Onset   Cancer Father        oral cancer   Cancer Sister        colon cancer and lymphoma   Colon cancer Sister    Breast cancer Neg Hx     Allergies  Allergen Reactions   Penicillins Anaphylaxis    REACTION: severe anaphylaxis Has patient had a PCN reaction causing immediate rash, facial/tongue/throat swelling, SOB or lightheadedness with hypotension: Yes Has patient had a PCN reaction causing severe rash involving mucus membranes or skin necrosis: No Has patient had a PCN reaction that required hospitalization No Has patient had a PCN reaction occurring within the last 10 years: No If all of the above answers are "NO", then may proceed with  Cephalosporin use.    Zithromax [Azithromycin] Rash    z-pack = rash   Anastrozole Other (See Comments)    Extreme sweating   Cefuroxime Axetil Other (See Comments)    unknown   Codeine Other (See Comments)    unknown   Erythromycin Other (See Comments)    unknown   Nitrofurantoin Other (See Comments)    unknown   Ofloxacin Other (See Comments)    unknown   Prednisone Other (See Comments)   Propoxyphene Other (See Comments)   Tetracycline Other (See Comments)    unknown  Sulfa Antibiotics Other (See Comments) and Rash    Current Outpatient Medications on File Prior to Visit  Medication Sig Dispense Refill   acetaminophen (TYLENOL) 500 MG tablet Take 1,000 mg by mouth every 6 (six) hours as needed for mild pain.     albuterol (VENTOLIN HFA) 108 (90 Base) MCG/ACT inhaler Inhale into the lungs every 6 (six) hours as needed for wheezing or shortness of breath.     ALPRAZolam (XANAX) 0.5 MG tablet TAKE 1/2 TABLET BY MOUTH IN THE MORNING AND 1 TABLET IN THE EVENING IF NEEDED. SEDATION CAUTION 45 tablet 1   amLODipine (NORVASC) 5 MG tablet Take 0.5-1 tablets (2.5-5 mg total) by mouth daily.     diclofenac (VOLTAREN) 50 MG EC tablet Take 1 tablet (50 mg total) by mouth daily. As needed with food. 90 tablet 1   escitalopram (LEXAPRO) 20 MG tablet Take 1 tablet (20 mg total) by mouth daily. 90 tablet 1   fluticasone (FLONASE) 50 MCG/ACT nasal spray Place into both nostrils.     hydrochlorothiazide (HYDRODIURIL) 25 MG tablet TAKE 1 TABLET BY MOUTH ONCE A DAY 90 tablet 3   loperamide (IMODIUM A-D) 2 MG tablet Take 1 tablet (2 mg total) by mouth 4 (four) times daily as needed for diarrhea or loose stools.     montelukast (SINGULAIR) 10 MG tablet Take 1 tablet (10 mg total) by mouth at bedtime. 90 tablet 1   NYSTATIN powder APPLY 1 APPLICATION TOPICALLY 3 TIMES DAILY 60 g 0   rosuvastatin (CRESTOR) 10 MG tablet TAKE 1 TABLET BY MOUTH EVERY NIGHT AT BEDTIME (Patient not taking: Reported on  05/14/2022) 90 tablet 1   No current facility-administered medications on file prior to visit.    BP (!) 146/66   Pulse 70   Temp 98.2 F (36.8 C) (Temporal)   Ht 5' 3.5" (1.613 m)   Wt 187 lb (84.8 kg)   SpO2 98%   BMI 32.61 kg/m  Objective:   Physical Exam Constitutional:      Appearance: She is not ill-appearing.  HENT:     Right Ear: Tympanic membrane and ear canal normal.     Left Ear: Tympanic membrane and ear canal normal.     Nose:     Right Sinus: No maxillary sinus tenderness or frontal sinus tenderness.     Left Sinus: No maxillary sinus tenderness or frontal sinus tenderness.     Mouth/Throat:     Pharynx: No posterior oropharyngeal erythema.  Eyes:     Conjunctiva/sclera: Conjunctivae normal.  Cardiovascular:     Rate and Rhythm: Normal rate and regular rhythm.  Pulmonary:     Effort: Pulmonary effort is normal.     Breath sounds: Normal breath sounds. No wheezing or rales.     Comments: No cough during visit Musculoskeletal:     Cervical back: Neck supple.  Lymphadenopathy:     Cervical: No cervical adenopathy.  Skin:    General: Skin is warm and dry.           Assessment & Plan:  COVID-19 Assessment & Plan: Improving. Exam today reassuring.  Discussed that Covid-19 symptoms may linger for days, to weeks, and to months.  Checking BMP and CBC today given her generalized weakness and poor oral intake of food/liquids.  Rx for [employer] provided to use PRN Discussed to consider famotidine 20 mg HS given cough with meals and sleep.  Return precautions provided.   Orders: -     Basic metabolic  panel -     CBC with Differential/Platelet -     Benzonatate; Take 1 capsule (200 mg total) by mouth 3 (three) times daily as needed for cough.  Dispense: 15 capsule; Refill: 0        Pleas Koch, NP

## 2022-05-14 NOTE — Telephone Encounter (Signed)
Noted, will evaluate. 

## 2022-05-15 ENCOUNTER — Telehealth: Payer: Self-pay | Admitting: Family Medicine

## 2022-05-15 NOTE — Telephone Encounter (Signed)
Patient called in and would like to know how long she should drink Gatorade or Pedialyte to help with her electrolyte imbalance? She said that she drunk a whole bottle of Gatorade today,and wonders how much more she should drink?

## 2022-05-15 NOTE — Telephone Encounter (Signed)
Advised patient per result note from [place]. All of patients questions were answered no further assistance is needed at this time.  She will call back next week to schedule follow up with PCP is she is not significantly better

## 2022-06-01 ENCOUNTER — Other Ambulatory Visit: Payer: Self-pay | Admitting: Family Medicine

## 2022-06-03 ENCOUNTER — Ambulatory Visit: Payer: Medicare HMO | Attending: Orthopaedic Surgery

## 2022-06-03 DIAGNOSIS — M25562 Pain in left knee: Secondary | ICD-10-CM | POA: Insufficient documentation

## 2022-06-03 DIAGNOSIS — M25561 Pain in right knee: Secondary | ICD-10-CM | POA: Diagnosis not present

## 2022-06-03 DIAGNOSIS — M6281 Muscle weakness (generalized): Secondary | ICD-10-CM | POA: Diagnosis not present

## 2022-06-03 DIAGNOSIS — G8929 Other chronic pain: Secondary | ICD-10-CM | POA: Diagnosis not present

## 2022-06-03 NOTE — Therapy (Signed)
[facility] [address], [place], [address] Phone: [phone]   Fax:  [phone]  Physical Therapy Evaluation  Patient Details  Name: Jamie Curry MRN: [id number] Date of Birth: 1945-06-08 Referring Provider (PT): Mcarthur Rossetti, MD   Encounter Date: 06/03/2022   PT End of Session - 06/03/22 1103     Visit Number 1    Number of Visits 13    Date for PT Re-Evaluation 07/16/22    PT Start Time 1104    PT Stop Time 1150    PT Time Calculation (min) 46 min    Equipment Utilized During Treatment Gait belt    Activity Tolerance Patient tolerated treatment well    Behavior During Therapy [facility] for tasks assessed/performed             Past Medical History:  Diagnosis Date   Anxiety    Asthma    Breast cancer ([place]) 02/07/2016   left breast invasive lobular carcinoma   CAD (coronary artery disease)    mild CAD by cath in 2001   Depression    Diverticulosis    Heart murmur    HTN (hypertension)    Hyperlipidemia    Palpitations    Personal history of radiation therapy 2018   left breast ca   Squamous cell carcinoma of skin 12/09/2017   R lat base of neck, SCCIS   Syncope    Pre-syncope    Past Surgical History:  Procedure Laterality Date   ABDOMINAL HYSTERECTOMY     BREAST BIOPSY Left 02/07/2016   grade II invasive and in situ mammary carcinoma   BREAST CYST ASPIRATION Left 02/13/2016   cyst   BREAST LUMPECTOMY Left 03/03/2016   invasive lobular carcinoma, clear margins, METASTATIC LOBULAR CARCINOMA, 2.5 MM, IN ONE LYMPH NODE (1/1).    CHOLECYSTECTOMY     KNEE ARTHROSCOPY Right    PARTIAL MASTECTOMY WITH NEEDLE LOCALIZATION Left 03/03/2016   Procedure: PARTIAL MASTECTOMY WITH NEEDLE LOCALIZATION;  Surgeon: Leonie Green, MD;  Location: [facility];  Service: General;  Laterality: Left;   SENTINEL NODE BIOPSY Left 03/03/2016   Procedure: SENTINEL NODE BIOPSY;  Surgeon: Leonie Green, MD;  Location: [facility] OR[facility];  Service: General;  Laterality: Left;   TUBAL LIGATION      There were no vitals filed for this visit.    Subjective Assessment - 06/03/22 1106     Subjective R knee: 0/10 currently, and at worst for the past 3 months (has been taking Diclofenac for the past year), L knee, 0/10 currently and at worst for the past 3 months. Squatting however bothers her knees to a 5/10.    Pertinent History Chronic B knee pain. [facility] keep bothering her. Walked on the treadmil and did the bicycle which bothers her knee. Had a gel shot in both knees about 3 years ago which helped for a couple years. B knees are not hurting but R knee feels like it buckles once in a while. Used to walk her dog which died about 5 months ago. Doctor wants her to do PT for strength and balance. R knee bothers her more than her L . The diclofenac helps which she has been taking about a year.    Patient Stated Goals Be able to get down and get up from the floor.    Currently in Pain? No/denies    Pain Location Knee  Pain Orientation Right;Left    Pain Descriptors / Indicators --   Tired feeling   Pain Type Chronic pain    Pain Onset More than a month ago    Pain Frequency Occasional    Aggravating Factors  Squatting, using the treadmill, recumbent bike, bending her knees, stair negotiation. Standing for a while causes knees to give out in about 30 minutes (tired). Knees get more of a tired feeling.    Pain Relieving Factors Diclofenac, movement, walking    Multiple Pain Sites Yes                [facility] PT Assessment - 06/03/22 1117       Assessment   Medical Diagnosis M25.562,G89.29 (ICD-10-CM) - Chronic pain of left knee  M25.561,G89.29 (ICD-10-CM) - Chronic pain of right knee    Referring Provider (PT) Mcarthur Rossetti, MD    Onset Date/Surgical Date 05/04/22    Prior Therapy no known PT for current condition      Precautions   Precaution  Comments possible fall risk      Restrictions   Other Position/Activity Restrictions No known restrictions      Balance Screen   Has the patient fallen in the past 6 months Yes    How many times? 1    Has the patient had a decrease in activity level because of a fear of falling?  No    Is the patient reluctant to leave their home because of a fear of falling?  No      Home Environment   Additional Comments Pt lives in a senior appartment.      Posture/Postural Control   Posture Comments R shoulder higher, kyphosis, R lateral shift, R knee in slight flexion, R hip in slight ER      Strength   Right Hip Flexion 4/5    Right Hip Extension 3+/5   seated manually resisted   Right Hip External Rotation  4/5    Right Hip ABduction 4/5    Left Hip Flexion 4/5    Left Hip Extension 4-/5   seated manually resisted   Left Hip External Rotation 4/5    Left Hip ABduction 4/5    Right Knee Flexion 5/5    Right Knee Extension 5/5    Left Knee Flexion 4+/5    Left Knee Extension 5/5      Palpation   Palpation comment TTP R medial knee joint      Ambulation/Gait   Gait Comments Antalgic, decreased stance R LE, decreased trunk rotation      Dynamic Gait Index   Level Surface Normal    Change in Gait Speed Normal    Gait with Horizontal Head Turns Mild Impairment   Looking to the R causes unsteadiness   Gait with Vertical Head Turns Moderate Impairment    Gait and Pivot Turn Normal    Step Over Obstacle Mild Impairment    Step Around Obstacles Normal    Steps Mild Impairment    Total Score 19    DGI comment: < 19 suggests increased fall risk                        Objective measurements completed on examination: See above findings.   No latex allergies Blood pressure is controlled per pt.   Pt fell on R knee while walking but could have been from [place] this past January 2024.  Felt like her balance has  been a little off especially when she turns her head to the  R.    TUG no AD: 10.65 seconds; 9.75 seconds, 9.38    Pt was unable to do FOTO. Will obtain next session.   Response to treatment Pt tolerated session well without aggravation of symptoms.    Clinical impression Pt is a 77 year old female who came to physical therapy secondary to B knee pain. She also demonstrates altered gait pattern and posture, B hip weakness, and decreased balance when ambulating when moving her head to look around as well as difficulty performing tasks which involve squatting, negotiating stairs, and prolonged standing secondary to her knee symptoms. Pt will benefit from skilled physical therapy services to address the aforementioned deficits.                        PT Education - 06/03/22 1404     Education Details POC    Person(s) Educated Patient    Methods Explanation    Comprehension Verbalized understanding              PT Short Term Goals - 06/03/22 1357       PT SHORT TERM GOAL #1   Title Pt will be independent with her initial HEP to improve strength, balance, function, and ability to squat and perform stand <> floor transfers with less difficulty.    Time 3    Period Weeks    Status New    Target Date 06/25/22               PT Long Term Goals - 06/03/22 1358       PT LONG TERM GOAL #1   Title Pt will be able to perform a stand to floor to stand transfer, mod A at least 3x with minimal to no B  knee pain to promote ability to get up from the floor at home with less difficulty.    Baseline Difficulty performing floor to stand transfers with knee symptoms (06/03/2022)    Time 6    Period Weeks    Status New    Target Date 07/16/22      PT LONG TERM GOAL #2   Title Pt will improver her DGI score by at least 4 points as a demonstration of improved balance.    Baseline DGI score 19 (06/03/2022)    Time 6    Period Weeks    Status New    Target Date 07/16/22      PT LONG TERM GOAL #3   Title Pt will improve  bilateral hip extension and abduction strength to promote ability to ambulate, perform standing tasks, negotiate stairs with less difficulty.    Baseline Seated manually resisted hip extension 3+/5 R, 4-/5 L, S/L hip abduction 4/5 R and L (06/03/2022)    Time 6    Period Weeks    Status New    Target Date 07/16/22                    Plan - 06/03/22 1407     Clinical Impression Statement Pt is a 77 year old female who came to physical therapy secondary to B knee pain. She also demonstrates altered gait pattern and posture, B hip weakness, and decreased balance when ambulating when moving her head to look around as well as difficulty performing tasks which involve squatting, negotiating stairs, and prolonged standing secondary to her knee symptoms. Pt will benefit  from skilled physical therapy services to address the aforementioned deficits.    Personal Factors and Comorbidities Comorbidity 3+;Age;Fitness    Comorbidities Anxiety, hx of breast CA, depression, HTN, syncope    Examination-Activity Limitations Squat;Other   walking while looking around   Stability/Clinical Decision Making Stable/Uncomplicated    Clinical Decision Making Low    Rehab Potential Fair    PT Frequency 2x / week    PT Duration 6 weeks    PT Treatment/Interventions Therapeutic activities;Therapeutic exercise;Balance training;Neuromuscular re-education;Patient/family education;Manual techniques;Dry needling;Vestibular;Canalith Repostioning;Electrical Stimulation;Iontophoresis 65m/ml Dexamethasone;Gait training;Stair training    PT Next Visit Plan scapular strengthening, thoracic extension, trunk and hip strength, balance, manual techniques, modalities PRN    Consulted and Agree with Plan of Care Patient             Patient will benefit from skilled therapeutic intervention in order to improve the following deficits and impairments:  Pain, Postural dysfunction, Improper body mechanics, Decreased  strength  Visit Diagnosis: Chronic pain of right knee - Plan: PT plan of care cert/re-cert  Chronic pain of left knee - Plan: PT plan of care cert/re-cert  Muscle weakness (generalized) - Plan: PT plan of care cert/re-cert     Problem List Patient Active Problem List   Diagnosis Date Noted   COVID-19 05/14/2022   Edema 03/01/2022   Advance care planning 09/03/2021   Left shoulder pain 09/03/2021   Asthma 09/03/2021   Anxiety 09/03/2021   Unilateral primary osteoarthritis, left knee 08/19/2016   Unilateral primary osteoarthritis, right knee 08/19/2016   Hot flashes 03/25/2016   [place]ast [place] of [place]outer quadrant [place] 02/14[place]   Hyperlipidemia 11/12/2011   HTN (hypertension) 12/29/2010   Palpitations 12/29/2010   MJoneen BoersPT, DPT  06/03/2022, 4:06 PM  [facility] [place] [address]Phone: [phone]  Fax:  [phone] Name: EDEMETRIA ROLANDMRN: [id number] of Birth: [birth year]

## 2022-06-08 ENCOUNTER — Ambulatory Visit: Payer: Medicare HMO

## 2022-06-08 DIAGNOSIS — M25562 Pain in left knee: Secondary | ICD-10-CM | POA: Diagnosis not present

## 2022-06-08 DIAGNOSIS — M25561 Pain in right knee: Secondary | ICD-10-CM | POA: Diagnosis not present

## 2022-06-08 DIAGNOSIS — G8929 Other chronic pain: Secondary | ICD-10-CM

## 2022-06-08 DIAGNOSIS — M6281 Muscle weakness (generalized): Secondary | ICD-10-CM | POA: Diagnosis not present

## 2022-06-08 NOTE — Therapy (Signed)
OUTPATIENT PHYSICAL THERAPY TREATMENT NOTE   Patient Name: AMAYRANI NICOLI MRN: [id number] DOB:05-22-1945, 77 y.o., female Today's Date: 06/08/2022  PCP: Tonia Ghent, MD  REFERRING PROVIDER: Mcarthur Rossetti, MD   END OF SESSION:  PT End of Session - 06/08/22 1105     Visit Number 2    Number of Visits 13    Date for PT Re-Evaluation 07/16/22    PT Start Time 1105    PT Stop Time [id number]    PT Time Calculation (min) 40 min    Equipment Utilized During Treatment Gait belt    Activity Tolerance Patient tolerated treatment well    Behavior During Therapy WFL for tasks assessed/performed             Past Medical History:  Diagnosis Date   Anxiety    Asthma    Breast cancer ([place]) 02/07/2016   left breast invasive lobular carcinoma   CAD (coronary artery disease)    mild CAD by cath in 2001   Depression    Diverticulosis    Heart murmur    HTN (hypertension)    Hyperlipidemia    Palpitations    Personal history of radiation therapy 2018   left breast ca   Squamous cell carcinoma of skin 12/09/2017   R lat base of neck, SCCIS   Syncope    Pre-syncope   Past Surgical History:  Procedure Laterality Date   ABDOMINAL HYSTERECTOMY     BREAST BIOPSY Left 02/07/2016   grade II invasive and in situ mammary carcinoma   BREAST CYST ASPIRATION Left 02/13/2016   cyst   BREAST LUMPECTOMY Left 03/03/2016   invasive lobular carcinoma, clear margins, METASTATIC LOBULAR CARCINOMA, 2.5 MM, IN ONE LYMPH NODE (1/1).    CHOLECYSTECTOMY     KNEE ARTHROSCOPY Right    PARTIAL MASTECTOMY WITH NEEDLE LOCALIZATION Left 03/03/2016   Procedure: PARTIAL MASTECTOMY WITH NEEDLE LOCALIZATION;  Surgeon: Leonie Green, MD;  Location: [facility];  Service: General;  Laterality: Left;   SENTINEL NODE BIOPSY Left 03/03/2016   Procedure: SENTINEL NODE BIOPSY;  Surgeon: Leonie Green, MD;  Location: [facility];  Service: General;  Laterality: Left;   TUBAL LIGATION     Patient  Active Problem List   Diagnosis Date Noted   COVID-19 05/14/2022   Edema 03/01/2022   Advance care planning 09/03/2021   Left shoulder pain 09/03/2021   Asthma 09/03/2021   Anxiety 09/03/2021   Unilateral primary osteoarthritis, left knee 08/19/2016   Unilateral primary osteoarthritis, right knee 08/19/2016   Hot flashes 03/25/2016   Breast cancer of upper-outer quadrant of left female breast ([place]) 02/14/2016   Hyperlipidemia 11/12/2011   HTN (hypertension) 12/29/2010   Palpitations 12/29/2010    REFERRING DIAG: [id number] (ICD-10-CM) - Chronic pain of left knee M25.561,G89.29 (ICD-10-CM) - Chronic pain of right knee   THERAPY DIAG:  Muscle weakness (generalized)  Chronic pain of left knee  Chronic pain of right knee  Rationale for Evaluation and Treatment Rehabilitation  PERTINENT HISTORY: Chronic B knee pain. [facility] but [facility] keep bothering her. Walked on the treadmil and did the bicycle which bothers her knee. Had a gel shot in both knees about 3 years ago which helped for a couple years. B knees are not hurting but R knee feels like it buckles once in a while. Used to walk her dog which died about 5 months ago. Doctor wants her to do PT for strength  and balance. R knee bothers her more than her L . The diclofenac helps which she has been taking about a year.   PRECAUTIONS: possible fall risk   SUBJECTIVE:   SUBJECTIVE STATEMENT: Has gotten new glasses from her doctor. Wore it last week and did not feel right. No knee pain currently.    PAIN:  Are you having pain?    TODAY'S TREATMENT:                                                                                                                                         DATE: 06/08/2022    Therapeutic exercise  Standing B scapular retraction yellow band 10x5 seconds   Difficult  Seated B scapular retraction 10x5 seconds, no resistance for 3 sets  Seated chin tuck 10x5 seconds for  2 sets  Bent over hip extension (on table)  R 10x5 seconds  L 10x5 seconds     Felt low back discomfort.    Standing static mini lung with contralateral UE assist, yellow band around knees  R 10x2  L 10x2  Side stepping 20 ft to the R and 20 ft to the L with yellow band around knees to promote glute med muscle strengthening.      Pt was unable to do FOTO. Will obtain next session.     Improved exercise technique, movement at target joints, use of target muscles after mod verbal, visual, tactile cues.       Response to treatment Pt tolerated session well without aggravation of symptoms.      Clinical impression Worked on improving thoracic mobility to promote ability to ambulate while turning her head and looking up more steadily. Worked on glute med and max strengthening to promote femoral control during closed chain tasks to decrease stress to her knees. Pt tolerated session well without aggravation of symptoms. Pt will benefit from continued skilled physical therapy services to decrease pain, improve strength, balance, and function.      PATIENT EDUCATION: Education details: there-ex, HEP Person educated: Patient Education method: Explanation, Demonstration, Tactile cues, Verbal cues, and Handouts Education comprehension: verbalized understanding and returned demonstration  HOME EXERCISE PROGRAM: Access Code: [email or web address] URL: [email or web address] Date: 06/08/2022 Prepared by: Joneen Boers  Exercises - Seated Scapular Retraction  - 3 x daily - 7 x weekly - 3 sets - 10 reps - 5 seconds hold   PT Short Term Goals - 06/03/22 1357       PT SHORT TERM GOAL #1   Title Pt will be independent with her initial HEP to improve strength, balance, function, and ability to squat and perform stand <> floor transfers with less difficulty.    Time 3    Period Weeks    Status New    Target Date 06/25/22              PT Long Term Goals - 06/03/22  [place] #1   Title Pt will be able to perform a stand to floor to stand transfer, mod A at least 3x with minimal to no B  knee pain to promote ability to get up from the floor at home with less difficulty.    Baseline Difficulty performing floor to stand transfers with knee symptoms (06/03/2022)    Time 6    Period Weeks    Status New    Target Date 07/16/22      PT LONG TERM GOAL #2   Title Pt will improver her DGI score by at least 4 points as a demonstration of improved balance.    Baseline DGI score 19 (06/03/2022)    Time 6    Period Weeks    Status New    Target Date 07/16/22      PT LONG TERM GOAL #3   Title Pt will improve bilateral hip extension and abduction strength to promote ability to ambulate, perform standing tasks, negotiate stairs with less difficulty.    Baseline Seated manually resisted hip extension 3+/5 R, 4-/5 L, S/L hip abduction 4/5 R and L (06/03/2022)    Time 6    Period Weeks    Status New    Target Date 07/16/22              Plan - 06/08/22 1104     Clinical Impression Statement Worked on improving thoracic mobility to promote ability to ambulate while turning her head and looking up more steadily. Worked on glute med and max strengthening to promote femoral control during closed chain tasks to decrease stress to her knees. Pt tolerated session well without aggravation of symptoms. Pt will benefit from continued skilled physical therapy services to decrease pain, improve strength, balance, and function.    Personal Factors and Comorbidities Comorbidity 3+;Age;Fitness    Comorbidities Anxiety, hx of breast CA, depression, HTN, syncope    Examination-Activity Limitations Squat;Other   walking while looking around   Stability/Clinical Decision Making Stable/Uncomplicated    Rehab Potential Fair    PT Frequency 2x / week    PT Duration 6 weeks    PT Treatment/Interventions Therapeutic activities;Therapeutic exercise;Balance  training;Neuromuscular re-education;Patient/family education;Manual techniques;Dry needling;Vestibular;Canalith Repostioning;Electrical Stimulation;Iontophoresis '4mg'$ /ml Dexamethasone;Gait training;Stair training    PT Next Visit Plan scapular strengthening, thoracic extension, trunk and hip strength, balance, manual techniques, modalities PRN    Consulted and Agree with Plan of Care Patient              Joneen Boers PT, DPT  06/08/2022, 1:52 PM

## 2022-06-10 ENCOUNTER — Ambulatory Visit: Payer: Medicare HMO

## 2022-06-10 DIAGNOSIS — M25562 Pain in left knee: Secondary | ICD-10-CM | POA: Diagnosis not present

## 2022-06-10 DIAGNOSIS — M25561 Pain in right knee: Secondary | ICD-10-CM | POA: Diagnosis not present

## 2022-06-10 DIAGNOSIS — G8929 Other chronic pain: Secondary | ICD-10-CM

## 2022-06-10 DIAGNOSIS — M6281 Muscle weakness (generalized): Secondary | ICD-10-CM

## 2022-06-10 NOTE — Therapy (Signed)
OUTPATIENT PHYSICAL THERAPY TREATMENT NOTE   Patient Name: Jamie Curry MRN: [id number] DOB:[birth year], 77 y.o., female Today's Date: 06/10/2022  PCP: Tonia Ghent, MD  REFERRING PROVIDER: Mcarthur Rossetti, MD   END OF SESSION:  PT End of Session - 06/10/22 1020     Visit Number 3    Number of Visits 13    Date for PT Re-Evaluation 07/16/22    PT Start Time 1020    PT Stop Time 1101    PT Time Calculation (min) 41 min    Equipment Utilized During Treatment Gait belt    Activity Tolerance Patient tolerated treatment well    Behavior During Therapy WFL for tasks assessed/performed              Past Medical History:  Diagnosis Date   Anxiety    Asthma    Breast cancer ([place]) 02/07/2016   left breast invasive lobular carcinoma   CAD (coronary artery disease)    mild CAD by cath in 2001   Depression    Diverticulosis    Heart murmur    HTN (hypertension)    Hyperlipidemia    Palpitations    Personal history of radiation therapy 2018   left breast ca   Squamous cell carcinoma of skin 12/09/2017   R lat base of neck, SCCIS   Syncope    Pre-syncope   Past Surgical History:  Procedure Laterality Date   ABDOMINAL HYSTERECTOMY     BREAST BIOPSY Left 02/07/2016   grade II invasive and in situ mammary carcinoma   BREAST CYST ASPIRATION Left 02/13/2016   cyst   BREAST LUMPECTOMY Left 03/03/2016   invasive lobular carcinoma, clear margins, METASTATIC LOBULAR CARCINOMA, 2.5 MM, IN ONE LYMPH NODE (1/1).    CHOLECYSTECTOMY     KNEE ARTHROSCOPY Right    PARTIAL MASTECTOMY WITH NEEDLE LOCALIZATION Left 03/03/2016   Procedure: PARTIAL MASTECTOMY WITH NEEDLE LOCALIZATION;  Surgeon: Leonie Green, MD;  Location: [facility];  Service: General;  Laterality: Left;   SENTINEL NODE BIOPSY Left 03/03/2016   Procedure: SENTINEL NODE BIOPSY;  Surgeon: Leonie Green, MD;  Location: [facility];  Service: General;  Laterality: Left;   TUBAL LIGATION     Patient  Active Problem List   Diagnosis Date Noted   COVID-19 05/14/2022   Edema 03/01/2022   Advance care planning 09/03/2021   Left shoulder pain 09/03/2021   Asthma 09/03/2021   Anxiety 09/03/2021   Unilateral primary osteoarthritis, left knee 08/19/2016   Unilateral primary osteoarthritis, right knee 08/19/2016   Hot flashes 03/25/2016   Breast cancer of upper-outer quadrant of left female breast ([place]) 02/14/2016   Hyperlipidemia 11/12/2011   HTN (hypertension) 12/29/2010   Palpitations 12/29/2010    REFERRING DIAG: [id number] (ICD-10-CM) - Chronic pain of left knee M25.561,G89.29 (ICD-10-CM) - Chronic pain of right knee   THERAPY DIAG:  Muscle weakness (generalized)  Chronic pain of left knee  Chronic pain of right knee  Rationale for Evaluation and Treatment Rehabilitation  PERTINENT HISTORY: Chronic B knee pain. [facility] but her knees keep bothering her. Walked on the treadmil and did the bicycle which bothers her knee. Had a gel shot in both knees about 3 years ago which helped for a couple years. B knees are not hurting but R knee feels like it buckles once in a while. Used to walk her dog which died about 5 months ago. Doctor wants her to do PT for  strength and balance. R knee bothers her more than her L . The diclofenac helps which she has been taking about a year.   PRECAUTIONS: possible fall risk   SUBJECTIVE:   SUBJECTIVE STATEMENT: low back and R knee bothered her a little bit the other day. No pain today   PAIN:  Are you having pain?     No latex allergies   TODAY'S TREATMENT:                                                                                                                                         DATE: 06/10/2022    Therapeutic exercise  Nustep seat 6, arms 6, level 1 x 5 minutes for joint nutrition  Sit <> floor <> sit transfer with chair assist, CGA to SBA  Able to perform herself but could use more LE strength  (such as lunges)  Standing static mini lung with contralateral UE assist, yellow band around knees  R 10x2  L 10x2  Side step while maintaining mini squat yellow band around knees 32 ft to the R and 32 ft to the L   Low back discomfort while side stepping to the L > R  Standing hip abduction with B UE assist, yellow band around ankles  R 10x3  L 10x3  Hooklying B shoulder horizontal abduction with scapular retraction to promote thoracic extension 10x3 with 5 second holds.       Improved exercise technique, movement at target joints, use of target muscles after mod verbal, visual, tactile cues.       Response to treatment Pt tolerated session well without aggravation of symptoms.      Clinical impression  Continued working on improving LE strength to promote ability to push herself up from the floor when performing floor to chair to stand transfers with less difficulty. Continued working on glute strength to promote femoral and pelvic control to decrease stress to low back and knees. Pt tolerated session well without aggravation of symptoms. Pt will benefit from continued skilled physical therapy services to decrease pain, improve strength, balance, and function.      PATIENT EDUCATION: Education details: there-ex, HEP Person educated: Patient Education method: Explanation, Demonstration, Tactile cues, Verbal cues, and Handouts Education comprehension: verbalized understanding and returned demonstration  HOME EXERCISE PROGRAM: Access Code: [email or web address] URL: [email or web address] Date: 06/08/2022 Prepared by: Joneen Boers  Exercises - Seated Scapular Retraction  - 3 x daily - 7 x weekly - 3 sets - 10 reps - 5 seconds hold - Standing Partial Lunge  - 1 x daily - 7 x weekly - 2 sets - 10 reps    Yellow band - Supine Shoulder Horizontal Abduction with Dumbbells  - 1 x daily - 7 x weekly - 3 sets - 10 reps - 5 seconds hold     PT Short Term Goals - 06/03/22  1357  PT SHORT TERM GOAL #1   Title Pt will be independent with her initial HEP to improve strength, balance, function, and ability to squat and perform stand <> floor transfers with less difficulty.    Time 3    Period Weeks    Status New    Target Date 06/25/22              PT Long Term Goals - 06/03/22 1358       PT LONG TERM GOAL #1   Title Pt will be able to perform a stand to floor to stand transfer, mod A at least 3x with minimal to no B  knee pain to promote ability to get up from the floor at home with less difficulty.    Baseline Difficulty performing floor to stand transfers with knee symptoms (06/03/2022)    Time 6    Period Weeks    Status New    Target Date 07/16/22      PT LONG TERM GOAL #2   Title Pt will improver her DGI score by at least 4 points as a demonstration of improved balance.    Baseline DGI score 19 (06/03/2022)    Time 6    Period Weeks    Status New    Target Date 07/16/22      PT LONG TERM GOAL #3   Title Pt will improve bilateral hip extension and abduction strength to promote ability to ambulate, perform standing tasks, negotiate stairs with less difficulty.    Baseline Seated manually resisted hip extension 3+/5 R, 4-/5 L, S/L hip abduction 4/5 R and L (06/03/2022)    Time 6    Period Weeks    Status New    Target Date 07/16/22              Plan - 06/10/22 1019     Clinical Impression Statement Continued working on improving LE strength to promote ability to push herself up from the floor when performing floor to chair to stand transfers with less difficulty. Continued working on glute strength to promote femoral and pelvic control to decrease stress to low back and knees. Pt tolerated session well without aggravation of symptoms. Pt will benefit from continued skilled physical therapy services to decrease pain, improve strength, balance, and function.    Personal Factors and Comorbidities Comorbidity 3+;Age;Fitness     Comorbidities Anxiety, hx of breast CA, depression, HTN, syncope    Examination-Activity Limitations Squat;Other   walking while looking around   Stability/Clinical Decision Making Stable/Uncomplicated    Rehab Potential Fair    PT Frequency 2x / week    PT Duration 6 weeks    PT Treatment/Interventions Therapeutic activities;Therapeutic exercise;Balance training;Neuromuscular re-education;Patient/family education;Manual techniques;Dry needling;Vestibular;Canalith Repostioning;Electrical Stimulation;Iontophoresis '4mg'$ /ml Dexamethasone;Gait training;Stair training    PT Next Visit Plan scapular strengthening, thoracic extension, trunk and hip strength, balance, manual techniques, modalities PRN    Consulted and Agree with Plan of Care Patient               Joneen Boers PT, DPT  06/10/2022, 1:22 PM

## 2022-06-15 ENCOUNTER — Ambulatory Visit: Payer: Medicare HMO | Attending: Orthopaedic Surgery

## 2022-06-15 DIAGNOSIS — G8929 Other chronic pain: Secondary | ICD-10-CM | POA: Diagnosis not present

## 2022-06-15 DIAGNOSIS — M25561 Pain in right knee: Secondary | ICD-10-CM | POA: Insufficient documentation

## 2022-06-15 DIAGNOSIS — M25562 Pain in left knee: Secondary | ICD-10-CM | POA: Insufficient documentation

## 2022-06-15 DIAGNOSIS — M6281 Muscle weakness (generalized): Secondary | ICD-10-CM | POA: Diagnosis not present

## 2022-06-15 DIAGNOSIS — H6123 Impacted cerumen, bilateral: Secondary | ICD-10-CM | POA: Diagnosis not present

## 2022-06-15 DIAGNOSIS — H902 Conductive hearing loss, unspecified: Secondary | ICD-10-CM | POA: Diagnosis not present

## 2022-06-15 NOTE — Therapy (Signed)
OUTPATIENT PHYSICAL THERAPY TREATMENT NOTE   Patient Name: Jamie Curry MRN: [id number] DOB:[birth year], 77 y.o., female Today's Date: 06/15/2022  PCP: Tonia Ghent, MD  REFERRING PROVIDER: Mcarthur Rossetti, MD   END OF SESSION:  PT End of Session - 06/15/22 1149     Visit Number 4    Number of Visits 13    Date for PT Re-Evaluation 07/16/22    PT Start Time [id number]    PT Stop Time 1229    PT Time Calculation (min) 40 min    Equipment Utilized During Treatment Gait belt    Activity Tolerance Patient tolerated treatment well    Behavior During Therapy WFL for tasks assessed/performed               Past Medical History:  Diagnosis Date   Anxiety    Asthma    Breast cancer ([place]) 02/07/2016   left breast invasive lobular carcinoma   CAD (coronary artery disease)    mild CAD by cath in 2001   Depression    Diverticulosis    Heart murmur    HTN (hypertension)    Hyperlipidemia    Palpitations    Personal history of radiation therapy 2018   left breast ca   Squamous cell carcinoma of skin 12/09/2017   R lat base of neck, SCCIS   Syncope    Pre-syncope   Past Surgical History:  Procedure Laterality Date   ABDOMINAL HYSTERECTOMY     BREAST BIOPSY Left 02/07/2016   grade II invasive and in situ mammary carcinoma   BREAST CYST ASPIRATION Left 02/13/2016   cyst   BREAST LUMPECTOMY Left 03/03/2016   invasive lobular carcinoma, clear margins, METASTATIC LOBULAR CARCINOMA, 2.5 MM, IN ONE LYMPH NODE (1/1).    CHOLECYSTECTOMY     KNEE ARTHROSCOPY Right    PARTIAL MASTECTOMY WITH NEEDLE LOCALIZATION Left 03/03/2016   Procedure: PARTIAL MASTECTOMY WITH NEEDLE LOCALIZATION;  Surgeon: Leonie Green, MD;  Location: [facility];  Service: General;  Laterality: Left;   SENTINEL NODE BIOPSY Left 03/03/2016   Procedure: SENTINEL NODE BIOPSY;  Surgeon: Leonie Green, MD;  Location: [facility];  Service: General;  Laterality: Left;   TUBAL LIGATION      Patient Active Problem List   Diagnosis Date Noted   COVID-19 05/14/2022   Edema 03/01/2022   Advance care planning 09/03/2021   Left shoulder pain 09/03/2021   Asthma 09/03/2021   Anxiety 09/03/2021   Unilateral primary osteoarthritis, left knee 08/19/2016   Unilateral primary osteoarthritis, right knee 08/19/2016   Hot flashes 03/25/2016   Breast cancer of upper-outer quadrant of left female breast ([place]) 02/14/2016   Hyperlipidemia 11/12/2011   HTN (hypertension) 12/29/2010   Palpitations 12/29/2010    REFERRING DIAG: [id number] (ICD-10-CM) - Chronic pain of left knee M25.561,G89.29 (ICD-10-CM) - Chronic pain of right knee   THERAPY DIAG:  Muscle weakness (generalized)  Chronic pain of left knee  Chronic pain of right knee  Rationale for Evaluation and Treatment Rehabilitation  PERTINENT HISTORY: Chronic B knee pain. [facility] keep bothering her. Walked on the treadmil and did the bicycle which bothers her knee. Had a gel shot in both knees about 3 years ago which helped for a couple years. B knees are not hurting but R knee feels like it buckles once in a while. Used to walk her dog which died about 5 months ago. Doctor wants her to do PT  for strength and balance. R knee bothers her more than her L . The diclofenac helps which she has been taking about a year.   PRECAUTIONS: possible fall risk   SUBJECTIVE:   SUBJECTIVE STATEMENT:knees feel pretty good.    PAIN:  Are you having pain?     No latex allergies Blood pressure is controlled per pt.     TODAY'S TREATMENT:                                                                                                                                         DATE: 06/15/2022    Therapeutic exercise  Standing squat with B UE assist 5x. B posterior knee discomfort, decreased with increased hip flexion   Standing static mini lung with contralateral UE assist, yellow band around  knees  R 10x3  L 10x3  Standing hip abduction with B UE assist, yellow band around ankles  R 10x3  L 10x3  Bridge with green band resisting hip abduction 10x3  S/L hip abduction   R 10x  L 10x. L hip hinge compensation  Hooklying   Clamshell green band 10x3   Hip adduction isometrics, folded pillow squeeze 10x3 with 5 seconds holds   Hooklying B shoulder horizontal abduction with scapular retraction to promote thoracic extension 10x2 with 5 second holds.     Improved exercise technique, movement at target joints, use of target muscles after mod verbal, visual, tactile cues.       Response to treatment Pt tolerated session well without aggravation of symptoms.      Clinical impression  Continued working on glute strength to promote femoral and pelvic control to decrease stress to low back and knees. Pt tolerated session well without aggravation of symptoms. Pt will benefit from continued skilled physical therapy services to decrease pain, improve strength, balance, and function.      PATIENT EDUCATION: Education details: there-ex, HEP Person educated: Patient Education method: Explanation, Demonstration, Tactile cues, Verbal cues, and Handouts Education comprehension: verbalized understanding and returned demonstration  HOME EXERCISE PROGRAM: Access Code: [email or web address] URL: [email or web address] Date: 06/08/2022 Prepared by: Joneen Boers  Exercises - Seated Scapular Retraction  - 3 x daily - 7 x weekly - 3 sets - 10 reps - 5 seconds hold - Standing Partial Lunge  - 1 x daily - 7 x weekly - 2 sets - 10 reps    Yellow band - Supine Shoulder Horizontal Abduction with Dumbbells  - 1 x daily - 7 x weekly - 3 sets - 10 reps - 5 seconds hold - Hooklying Clamshell with Resistance  - 1 x daily - 7 x weekly - 3 sets - 10 reps  (Green band)  - Supine Hip Adduction Isometric with Ball  - 1 x daily - 7 x weekly - 3 sets - 10 reps - 5 seconds hold    PT Short  Term Goals -  06/03/22 1357       PT SHORT TERM GOAL #1   Title Pt will be independent with her initial HEP to improve strength, balance, function, and ability to squat and perform stand <> floor transfers with less difficulty.    Time 3    Period Weeks    Status New    Target Date 06/25/22              PT Long Term Goals - 06/03/22 1358       PT LONG TERM GOAL #1   Title Pt will be able to perform a stand to floor to stand transfer, mod A at least 3x with minimal to no B  knee pain to promote ability to get up from the floor at home with less difficulty.    Baseline Difficulty performing floor to stand transfers with knee symptoms (06/03/2022)    Time 6    Period Weeks    Status New    Target Date 07/16/22      PT LONG TERM GOAL #2   Title Pt will improver her DGI score by at least 4 points as a demonstration of improved balance.    Baseline DGI score 19 (06/03/2022)    Time 6    Period Weeks    Status New    Target Date 07/16/22      PT LONG TERM GOAL #3   Title Pt will improve bilateral hip extension and abduction strength to promote ability to ambulate, perform standing tasks, negotiate stairs with less difficulty.    Baseline Seated manually resisted hip extension 3+/5 R, 4-/5 L, S/L hip abduction 4/5 R and L (06/03/2022)    Time 6    Period Weeks    Status New    Target Date 07/16/22              Plan - 06/15/22 1149     Clinical Impression Statement Continued working on glute strength to promote femoral and pelvic control to decrease stress to low back and knees. Pt tolerated session well without aggravation of symptoms. Pt will benefit from continued skilled physical therapy services to decrease pain, improve strength, balance, and function.    Personal Factors and Comorbidities Comorbidity 3+;Age;Fitness    Comorbidities Anxiety, hx of breast CA, depression, HTN, syncope    Examination-Activity Limitations Squat;Other   walking while looking around    Stability/Clinical Decision Making Stable/Uncomplicated    Rehab Potential Fair    PT Frequency 2x / week    PT Duration 6 weeks    PT Treatment/Interventions Therapeutic activities;Therapeutic exercise;Balance training;Neuromuscular re-education;Patient/family education;Manual techniques;Dry needling;Vestibular;Canalith Repostioning;Electrical Stimulation;Iontophoresis '4mg'$ /ml Dexamethasone;Gait training;Stair training    PT Next Visit Plan scapular strengthening, thoracic extension, trunk and hip strength, balance, manual techniques, modalities PRN    Consulted and Agree with Plan of Care Patient                Joneen Boers PT, DPT  06/15/2022, 12:43 PM

## 2022-06-17 ENCOUNTER — Ambulatory Visit: Payer: Medicare HMO

## 2022-06-17 DIAGNOSIS — G8929 Other chronic pain: Secondary | ICD-10-CM

## 2022-06-17 DIAGNOSIS — M25562 Pain in left knee: Secondary | ICD-10-CM | POA: Diagnosis not present

## 2022-06-17 DIAGNOSIS — M6281 Muscle weakness (generalized): Secondary | ICD-10-CM

## 2022-06-17 DIAGNOSIS — M25561 Pain in right knee: Secondary | ICD-10-CM | POA: Diagnosis not present

## 2022-06-17 NOTE — Therapy (Signed)
OUTPATIENT PHYSICAL THERAPY TREATMENT NOTE   Patient Name: Jamie Curry MRN: [id number] DOB:[birth year], 77 y.o., female Today's Date: 06/17/2022  PCP: Tonia Ghent, MD  REFERRING PROVIDER: Mcarthur Rossetti, MD   END OF SESSION:  PT End of Session - 06/17/22 1019     Visit Number 5    Number of Visits 13    Date for PT Re-Evaluation 07/16/22    PT Start Time 15    PT Stop Time 1101    PT Time Calculation (min) 42 min    Equipment Utilized During Treatment Gait belt    Activity Tolerance Patient tolerated treatment well    Behavior During Therapy WFL for tasks assessed/performed                Past Medical History:  Diagnosis Date   Anxiety    Asthma    Breast cancer ([place]) 02/07/2016   left breast invasive lobular carcinoma   CAD (coronary artery disease)    mild CAD by cath in 2001   Depression    Diverticulosis    Heart murmur    HTN (hypertension)    Hyperlipidemia    Palpitations    Personal history of radiation therapy 2018   left breast ca   Squamous cell carcinoma of skin 12/09/2017   R lat base of neck, SCCIS   Syncope    Pre-syncope   Past Surgical History:  Procedure Laterality Date   ABDOMINAL HYSTERECTOMY     BREAST BIOPSY Left 02/07/2016   grade II invasive and in situ mammary carcinoma   BREAST CYST ASPIRATION Left 02/13/2016   cyst   BREAST LUMPECTOMY Left 03/03/2016   invasive lobular carcinoma, clear margins, METASTATIC LOBULAR CARCINOMA, 2.5 MM, IN ONE LYMPH NODE (1/1).    CHOLECYSTECTOMY     KNEE ARTHROSCOPY Right    PARTIAL MASTECTOMY WITH NEEDLE LOCALIZATION Left 03/03/2016   Procedure: PARTIAL MASTECTOMY WITH NEEDLE LOCALIZATION;  Surgeon: Leonie Green, MD;  Location: [facility];  Service: General;  Laterality: Left;   SENTINEL NODE BIOPSY Left 03/03/2016   Procedure: SENTINEL NODE BIOPSY;  Surgeon: Leonie Green, MD;  Location: [facility];  Service: General;  Laterality: Left;   TUBAL LIGATION      Patient Active Problem List   Diagnosis Date Noted   COVID-19 05/14/2022   Edema 03/01/2022   Advance care planning 09/03/2021   Left shoulder pain 09/03/2021   Asthma 09/03/2021   Anxiety 09/03/2021   Unilateral primary osteoarthritis, left knee 08/19/2016   Unilateral primary osteoarthritis, right knee 08/19/2016   Hot flashes 03/25/2016   Breast cancer of upper-outer quadrant of left female breast ([place]) 02/14/2016   Hyperlipidemia 11/12/2011   HTN (hypertension) 12/29/2010   Palpitations 12/29/2010    REFERRING DIAG: [id number] (ICD-10-CM) - Chronic pain of left knee M25.561,G89.29 (ICD-10-CM) - Chronic pain of right knee   THERAPY DIAG:  Muscle weakness (generalized)  Chronic pain of left knee  Chronic pain of right knee  Rationale for Evaluation and Treatment Rehabilitation  PERTINENT HISTORY: Chronic B knee pain. [facility] but [facility] knees keep bothering her. Walked on the treadmil and did the bicycle which bothers her knee. Had a gel shot in both knees about 3 years ago which helped for a couple years. B knees are not hurting but R knee feels like it buckles once in a while. Used to walk her dog which died about 5 months ago. Doctor wants her to do  PT for strength and balance. R knee bothers her more than her L . The diclofenac helps which she has been taking about a year.   PRECAUTIONS: possible fall risk   SUBJECTIVE:   SUBJECTIVE STATEMENT:knees feel pretty good.    PAIN:  Are you having pain?     No latex allergies Blood pressure is controlled per pt.     TODAY'S TREATMENT:                                                                                                                                         DATE: 06/17/2022    Therapeutic exercise   Standing static mini lunge with contralateral UE assist, red band around knees  R 10x3  L 10x3  Standing hip abduction with B UE assist, red band around ankles  R 10x3  L  10x3   Stepping over 4 mini hurdles 4x with CGA to SBA  Gait over floor mat with 2 ankle weights underneath so simulate uneven surface 6x    Then with stepping over 2 mini hurdles to simulate fallen branches on uneven surface outside 8x  LOB x 2, min A to recover.    S/L hip abduction   R 10x2  L 10x2  Bridge with green band resisting hip abduction 10x  Low back extension compensation observed  Hooklying   Posterior pelvic tilt 10x5 seconds for 3 sets      Improved exercise technique, movement at target joints, use of target muscles after mod verbal, visual, tactile cues.       Response to treatment Pt tolerated session well without aggravation of symptoms.      Clinical impression   Worked on obstacle negotiation and gait over uneven surface to improve balance and decrease fall risk. Some difficulty initially with obstacle negotiation on uneven surface with LOB x 2, min A to recover.  Continued working on glute and trunk strength to promote femoral and pelvic control to decrease stress to low back and knees. Pt tolerated session well without aggravation of symptoms. Pt will benefit from continued skilled physical therapy services to decrease pain, improve strength, balance, and function.      PATIENT EDUCATION: Education details: there-ex, HEP Person educated: Patient Education method: Explanation, Demonstration, Tactile cues, Verbal cues, and Handouts Education comprehension: verbalized understanding and returned demonstration  HOME EXERCISE PROGRAM: Access Code: [email or web address] URL: [email or web address] Date: 06/08/2022 Prepared by: Joneen Boers  Exercises - Seated Scapular Retraction  - 3 x daily - 7 x weekly - 3 sets - 10 reps - 5 seconds hold - Standing Partial Lunge  - 1 x daily - 7 x weekly - 2 sets - 10 reps    Yellow band - Supine Shoulder Horizontal Abduction with Dumbbells  - 1 x daily - 7 x weekly - 3 sets - 10 reps - 5 seconds hold -  Hooklying Clamshell with Resistance  -  1 x daily - 7 x weekly - 3 sets - 10 reps  (Green band)  - Supine Hip Adduction Isometric with Ball  - 1 x daily - 7 x weekly - 3 sets - 10 reps - 5 seconds hold - Supine Posterior Pelvic Tilt  - 1 x daily - 7 x weekly - 3 sets - 10 reps - 5 seconds hold   PT Short Term Goals - 06/03/22 1357       PT SHORT TERM GOAL #1   Title Pt will be independent with her initial HEP to improve strength, balance, function, and ability to squat and perform stand <> floor transfers with less difficulty.    Time 3    Period Weeks    Status New    Target Date 06/25/22              PT Long Term Goals - 06/03/22 1358       PT LONG TERM GOAL #1   Title Pt will be able to perform a stand to floor to stand transfer, mod A at least 3x with minimal to no B  knee pain to promote ability to get up from the floor at home with less difficulty.    Baseline Difficulty performing floor to stand transfers with knee symptoms (06/03/2022)    Time 6    Period Weeks    Status New    Target Date 07/16/22      PT LONG TERM GOAL #2   Title Pt will improver her DGI score by at least 4 points as a demonstration of improved balance.    Baseline DGI score 19 (06/03/2022)    Time 6    Period Weeks    Status New    Target Date 07/16/22      PT LONG TERM GOAL #3   Title Pt will improve bilateral hip extension and abduction strength to promote ability to ambulate, perform standing tasks, negotiate stairs with less difficulty.    Baseline Seated manually resisted hip extension 3+/5 R, 4-/5 L, S/L hip abduction 4/5 R and L (06/03/2022)    Time 6    Period Weeks    Status New    Target Date 07/16/22              Plan - 06/17/22 1018     Clinical Impression Statement Worked on obstacle negotiation and gait over uneven surface to improve balance and decrease fall risk. Some difficulty initially with obstacle negotiation on uneven surface with LOB x 2, min A to recover.   Continued working on glute and trunk strength to promote femoral and pelvic control to decrease stress to low back and knees. Pt tolerated session well without aggravation of symptoms. Pt will benefit from continued skilled physical therapy services to decrease pain, improve strength, balance, and function.    Personal Factors and Comorbidities Comorbidity 3+;Age;Fitness    Comorbidities Anxiety, hx of breast CA, depression, HTN, syncope    Examination-Activity Limitations Squat;Other   walking while looking around   Stability/Clinical Decision Making Stable/Uncomplicated    Rehab Potential Fair    PT Frequency 2x / week    PT Duration 6 weeks    PT Treatment/Interventions Therapeutic activities;Therapeutic exercise;Balance training;Neuromuscular re-education;Patient/family education;Manual techniques;Dry needling;Vestibular;Canalith Repostioning;Electrical Stimulation;Iontophoresis '4mg'$ /ml Dexamethasone;Gait training;Stair training    PT Next Visit Plan scapular strengthening, thoracic extension, trunk and hip strength, balance, manual techniques, modalities PRN    PT Home Exercise Plan Medbridge Access Code: [email or web address]  Consulted and Agree with Plan of Care Patient                Joneen Boers PT, DPT  06/17/2022, 11:16 AM

## 2022-06-22 ENCOUNTER — Other Ambulatory Visit: Payer: Self-pay | Admitting: Family Medicine

## 2022-06-22 ENCOUNTER — Ambulatory Visit: Payer: Medicare HMO

## 2022-06-22 ENCOUNTER — Telehealth: Payer: Self-pay

## 2022-06-22 DIAGNOSIS — Z170 Estrogen receptor positive status [ER+]: Secondary | ICD-10-CM

## 2022-06-22 DIAGNOSIS — G8929 Other chronic pain: Secondary | ICD-10-CM | POA: Diagnosis not present

## 2022-06-22 DIAGNOSIS — M6281 Muscle weakness (generalized): Secondary | ICD-10-CM | POA: Diagnosis not present

## 2022-06-22 DIAGNOSIS — M25561 Pain in right knee: Secondary | ICD-10-CM | POA: Diagnosis not present

## 2022-06-22 DIAGNOSIS — M25562 Pain in left knee: Secondary | ICD-10-CM | POA: Diagnosis not present

## 2022-06-22 NOTE — Therapy (Signed)
OUTPATIENT PHYSICAL THERAPY TREATMENT NOTE   Patient Name: Jamie Curry MRN: [id number] DOB:Aug 18, 1945, 77 y.o., female Today's Date: 06/22/2022  PCP: Tonia Ghent, MD  REFERRING PROVIDER: Mcarthur Rossetti, MD   END OF SESSION:  PT End of Session - 06/22/22 0938     Visit Number 6    Number of Visits 13    Date for PT Re-Evaluation 07/16/22    PT Start Time 0938    PT Stop Time 1016    PT Time Calculation (min) 38 min    Equipment Utilized During Treatment Gait belt    Activity Tolerance Patient tolerated treatment well    Behavior During Therapy [facility] for tasks assessed/performed                 Past Medical History:  Diagnosis Date   Anxiety    Asthma    Breast cancer ([place]) 02/07/2016   left breast invasive lobular carcinoma   CAD (coronary artery disease)    mild CAD by cath in 2001   Depression    Diverticulosis    Heart murmur    HTN (hypertension)    Hyperlipidemia    Palpitations    Personal history of radiation therapy 2018   left breast ca   Squamous cell carcinoma of skin 12/09/2017   R lat base of neck, SCCIS   Syncope    Pre-syncope   Past Surgical History:  Procedure Laterality Date   ABDOMINAL HYSTERECTOMY     BREAST BIOPSY Left 02/07/2016   grade II invasive and in situ mammary carcinoma   BREAST CYST ASPIRATION Left 02/13/2016   cyst   BREAST LUMPECTOMY Left 03/03/2016   invasive lobular carcinoma, clear margins, METASTATIC LOBULAR CARCINOMA, 2.5 MM, IN ONE LYMPH NODE (1/1).    CHOLECYSTECTOMY     KNEE ARTHROSCOPY Right    PARTIAL MASTECTOMY WITH NEEDLE LOCALIZATION Left 03/03/2016   Procedure: PARTIAL MASTECTOMY WITH NEEDLE LOCALIZATION;  Surgeon: Leonie Green, MD;  Location: [facility];  Service: General;  Laterality: Left;   SENTINEL NODE BIOPSY Left 03/03/2016   Procedure: SENTINEL NODE BIOPSY;  Surgeon: Leonie Green, MD;  Location: [facility];  Service: General;  Laterality: Left;   TUBAL LIGATION      Patient Active Problem List   Diagnosis Date Noted   COVID-19 05/14/2022   Edema 03/01/2022   Advance care planning 09/03/2021   Left shoulder pain 09/03/2021   Asthma 09/03/2021   Anxiety 09/03/2021   Unilateral primary osteoarthritis, left knee 08/19/2016   Unilateral primary osteoarthritis, right knee 08/19/2016   Hot flashes 03/25/2016   Breast cancer of upper-outer quadrant of left female breast ([place]) 02/14/2016   Hyperlipidemia 11/12/2011   HTN (hypertension) 12/29/2010   Palpitations 12/29/2010    REFERRING DIAG: [id number] (ICD-10-CM) - Chronic pain of left knee M25.561,G89.29 (ICD-10-CM) - Chronic pain of right knee   THERAPY DIAG:  Muscle weakness (generalized)  Chronic pain of left knee  Chronic pain of right knee  Rationale for Evaluation and Treatment Rehabilitation  PERTINENT HISTORY: Chronic B knee pain. [facility] keep bothering her. Walked on the treadmil and did the bicycle which bothers her knee. Had a gel shot in both knees about 3 years ago which helped for a couple years. B knees are not hurting but R knee feels like it buckles once in a while. Used to walk her dog which died about 5 months ago. Doctor wants her to  do PT for strength and balance. R knee bothers her more than her L . The diclofenac helps which she has been taking about a year.   PRECAUTIONS: possible fall risk   SUBJECTIVE:   SUBJECTIVE STATEMENT:Knees have been doing ok. L knee joint pain with extension. Has been having pain for years. The gel shot lasted for years.    PAIN:  Are you having pain?     No latex allergies Blood pressure is controlled per pt.     TODAY'S TREATMENT:                                                                                                                                         DATE: 06/22/2022    Therapeutic exercise  Posture:  L leg in ER  TTP L knee joint in middle  Seated L knee flexion green  band resisting green band 10x3  Decreased L knee pain  Seated L knee extension stretch 1 minute x 3  Standing static lunge with contralateral UE assist,   R 10x3  L 10x3  Stepping over 4 mini hurdles 4x with CGA to SBA  Gait over floor mat with 2 ankle weights underneath so simulate uneven surface 6x   Then with stepping over 2 mini hurdles to simulate fallen branches on uneven surface outside 8x  No LOB today.      Improved exercise technique, movement at target joints, use of target muscles after mod verbal, visual, tactile cues.       Response to treatment Pt tolerated session well without aggravation of symptoms.      Clinical impression  Decreased L knee pain with treatment to promote medial hamstring muscle use to promote more neutral tibial position. Continued working on obstacle negotiation and gait over uneven surface to improve balance and decrease fall risk. No LOB today.  Continued working on glute, quadriceps and trunk strength to promote femoral and pelvic control to decrease stress to low back and knees as well as decreasing difficulty with floor to stand transfers.  Pt tolerated session well without aggravation of symptoms. Pt will benefit from continued skilled physical therapy services to decrease pain, improve strength, balance, and function.      PATIENT EDUCATION: Education details: there-ex, HEP Person educated: Patient Education method: Explanation, Demonstration, Tactile cues, Verbal cues, and Handouts Education comprehension: verbalized understanding and returned demonstration  HOME EXERCISE PROGRAM: Access Code: [email or web address] URL: [email or web address] Date: 06/08/2022 Prepared by: Joneen Boers  Exercises - Seated Scapular Retraction  - 3 x daily - 7 x weekly - 3 sets - 10 reps - 5 seconds hold - Standing Partial Lunge  - 1 x daily - 7 x weekly - 2 sets - 10 reps    Yellow band - Supine Shoulder Horizontal Abduction with Dumbbells   - 1 x daily - 7 x weekly - 3 sets - 10 reps - 5  seconds hold - Hooklying Clamshell with Resistance  - 1 x daily - 7 x weekly - 3 sets - 10 reps  (Green band)  - Supine Hip Adduction Isometric with Ball  - 1 x daily - 7 x weekly - 3 sets - 10 reps - 5 seconds hold - Supine Posterior Pelvic Tilt  - 1 x daily - 7 x weekly - 3 sets - 10 reps - 5 seconds hold   PT Short Term Goals - 06/03/22 1357       PT SHORT TERM GOAL #1   Title Pt will be independent with her initial HEP to improve strength, balance, function, and ability to squat and perform stand <> floor transfers with less difficulty.    Time 3    Period Weeks    Status New    Target Date 06/25/22              PT Long Term Goals - 06/03/22 1358       PT LONG TERM GOAL #1   Title Pt will be able to perform a stand to floor to stand transfer, mod A at least 3x with minimal to no B  knee pain to promote ability to get up from the floor at home with less difficulty.    Baseline Difficulty performing floor to stand transfers with knee symptoms (06/03/2022)    Time 6    Period Weeks    Status New    Target Date 07/16/22      PT LONG TERM GOAL #2   Title Pt will improver her DGI score by at least 4 points as a demonstration of improved balance.    Baseline DGI score 19 (06/03/2022)    Time 6    Period Weeks    Status New    Target Date 07/16/22      PT LONG TERM GOAL #3   Title Pt will improve bilateral hip extension and abduction strength to promote ability to ambulate, perform standing tasks, negotiate stairs with less difficulty.    Baseline Seated manually resisted hip extension 3+/5 R, 4-/5 L, S/L hip abduction 4/5 R and L (06/03/2022)    Time 6    Period Weeks    Status New    Target Date 07/16/22              Plan - 06/22/22 0938     Clinical Impression Statement Decreased L knee pain with treatment to promote medial hamstring muscle use to promote more neutral tibial position. Continued working on obstacle  negotiation and gait over uneven surface to improve balance and decrease fall risk. No LOB today.  Continued working on glute, quadriceps and trunk strength to promote femoral and pelvic control to decrease stress to low back and knees as well as decreasing difficulty with floor to stand transfers.  Pt tolerated session well without aggravation of symptoms. Pt will benefit from continued skilled physical therapy services to decrease pain, improve strength, balance, and function.    Personal Factors and Comorbidities Comorbidity 3+;Age;Fitness    Comorbidities Anxiety, hx of breast CA, depression, HTN, syncope    Examination-Activity Limitations Squat;Other   walking while looking around   Stability/Clinical Decision Making Stable/Uncomplicated    Rehab Potential Fair    PT Frequency 2x / week    PT Duration 6 weeks    PT Treatment/Interventions Therapeutic activities;Therapeutic exercise;Balance training;Neuromuscular re-education;Patient/family education;Manual techniques;Dry needling;Vestibular;Canalith Repostioning;Electrical Stimulation;Iontophoresis '4mg'$ /ml Dexamethasone;Gait training;Stair training    PT Next Visit Plan scapular  strengthening, thoracic extension, trunk and hip strength, balance, manual techniques, modalities PRN    PT Home Exercise Plan Medbridge Access Code: [email or web address]    Consulted and Agree with Plan of Care Patient                Joneen Boers PT, DPT  06/22/2022, 1:19 PM

## 2022-06-22 NOTE — Progress Notes (Signed)
Care Management & Coordination Services Pharmacy Team  Reason for Encounter: Appointment Reminder  Contacted patient to confirm telephone appointment with Charlene Brooke, PharmD on 06/25/2022 at 11:00.  Unsuccessful outreach. Left voicemail for patient to return call.  Star Rating Drugs:  Medication:  Last Fill: Day Supply Rosuvastatin 10 mg 03/23/2022 90  Care Gaps: Annual wellness visit in last year? Yes 12/12/2021  Charlene Brooke, PharmD notified  Marijean Niemann, [place] Pharmacy Assistant [phone]

## 2022-06-23 ENCOUNTER — Other Ambulatory Visit: Payer: Self-pay | Admitting: Family Medicine

## 2022-06-23 NOTE — Telephone Encounter (Signed)
Refill request for ALPRAZOLAM 0.'5MG'$  TABLET   LOV - 05/14/22 Next OV - not scheduled Last refill - 04/21/22 #45/1

## 2022-06-24 ENCOUNTER — Ambulatory Visit: Payer: Medicare HMO

## 2022-06-24 DIAGNOSIS — M6281 Muscle weakness (generalized): Secondary | ICD-10-CM | POA: Diagnosis not present

## 2022-06-24 DIAGNOSIS — G8929 Other chronic pain: Secondary | ICD-10-CM

## 2022-06-24 DIAGNOSIS — M25562 Pain in left knee: Secondary | ICD-10-CM | POA: Diagnosis not present

## 2022-06-24 DIAGNOSIS — M25561 Pain in right knee: Secondary | ICD-10-CM | POA: Diagnosis not present

## 2022-06-24 NOTE — Therapy (Signed)
OUTPATIENT PHYSICAL THERAPY TREATMENT NOTE   Patient Name: Jamie Curry MRN: [id number] DOB:12-11-1945, 77 y.o., female Today's Date: 06/24/2022  PCP: Tonia Ghent, MD  REFERRING PROVIDER: Mcarthur Rossetti, MD   END OF SESSION:  PT End of Session - 06/24/22 0936     Visit Number 7    Number of Visits 13    Date for PT Re-Evaluation 07/16/22    PT Start Time 0934    PT Stop Time [id number]    PT Time Calculation (min) 41 min    Equipment Utilized During Treatment Gait belt    Activity Tolerance Patient tolerated treatment well    Behavior During Therapy [facility] for tasks assessed/performed                 Past Medical History:  Diagnosis Date   Anxiety    Asthma    Breast cancer ([place]) 02/07/2016   left breast invasive lobular carcinoma   CAD (coronary artery disease)    mild CAD by cath in 2001   Depression    Diverticulosis    Heart murmur    HTN (hypertension)    Hyperlipidemia    Palpitations    Personal history of radiation therapy 2018   left breast ca   Squamous cell carcinoma of skin 12/09/2017   R lat base of neck, SCCIS   Syncope    Pre-syncope   Past Surgical History:  Procedure Laterality Date   ABDOMINAL HYSTERECTOMY     BREAST BIOPSY Left 02/07/2016   grade II invasive and in situ mammary carcinoma   BREAST CYST ASPIRATION Left 02/13/2016   cyst   BREAST LUMPECTOMY Left 03/03/2016   invasive lobular carcinoma, clear margins, METASTATIC LOBULAR CARCINOMA, 2.5 MM, IN ONE LYMPH NODE (1/1).    CHOLECYSTECTOMY     KNEE ARTHROSCOPY Right    PARTIAL MASTECTOMY WITH NEEDLE LOCALIZATION Left 03/03/2016   Procedure: PARTIAL MASTECTOMY WITH NEEDLE LOCALIZATION;  Surgeon: Leonie Green, MD;  Location: [facility];  Service: General;  Laterality: Left;   SENTINEL NODE BIOPSY Left 03/03/2016   Procedure: SENTINEL NODE BIOPSY;  Surgeon: Leonie Green, MD;  Location: [facility];  Service: General;  Laterality: Left;   TUBAL LIGATION      Patient Active Problem List   Diagnosis Date Noted   COVID-19 05/14/2022   Edema 03/01/2022   Advance care planning 09/03/2021   Left shoulder pain 09/03/2021   Asthma 09/03/2021   Anxiety 09/03/2021   Unilateral primary osteoarthritis, left knee 08/19/2016   Unilateral primary osteoarthritis, right knee 08/19/2016   Hot flashes 03/25/2016   Breast cancer of upper-outer quadrant of left female breast ([place]) 02/14/2016   Hyperlipidemia 11/12/2011   HTN (hypertension) 12/29/2010   Palpitations 12/29/2010    REFERRING DIAG: [id number] (ICD-10-CM) - Chronic pain of left knee M25.561,G89.29 (ICD-10-CM) - Chronic pain of right knee   THERAPY DIAG:  Muscle weakness (generalized)  Chronic pain of left knee  Chronic pain of right knee  Rationale for Evaluation and Treatment Rehabilitation  PERTINENT HISTORY: Chronic B knee pain. [facility] but her knees keep bothering her. Walked on the treadmil and did the bicycle which bothers her knee. Had a gel shot in both knees about 3 years ago which helped for a couple years. B knees are not hurting but R knee feels like it buckles once in a while. Used to walk her dog which died about 5 months ago. Doctor wants her to  do PT for strength and balance. R knee bothers her more than her L . The diclofenac helps which she has been taking about a year.   PRECAUTIONS: possible fall risk   SUBJECTIVE:   SUBJECTIVE STATEMENT: Pt reports no knee pain currently. Has not had any stumbles or falls.   PAIN:  Are you having pain?     No latex allergies Blood pressure is controlled per pt.     TODAY'S TREATMENT:                                                                                                                                         DATE: 06/24/2022    Therapeutic exercise  Standing knee flexion for hamstring strengthening: 3# AW's, 2x10 with BUE support.   Seated L knee extension stretch 1 minute x 3 with  heel on small 1/2 bolster.  Standing static lunge with contralateral UE assist on mat table. Standing rest after 2 sets.    R 10x3  L 10x3   Neuro Re-Ed:  Forwards and lateral gait with 6 hurdles. X6 laps/plane. Alternating pattern with forwards gait. Min multimodal cuing for maintaining frontal plane with lateral steps   Backwards gait: 6x10 meters.   Alternating 6" step ups for SLS and LE strengthening. Attempting without UE support. X12/LE    Improved exercise technique, movement at target joints, use of target muscles after mod verbal, visual, tactile cues.       Response to treatment Pt tolerated session well without aggravation of symptoms.      Clinical impression Continuing PT POC with focus on dynamic balance and LE strengthening. Heavy focus on SLS time with pt relying on frequent finger touch support and some lateral sway with hurdles to maintain balance. However pt able to correct without physical support. Pt will benefit from continued skilled physical therapy services to decrease pain, improve strength, balance, and function.    PATIENT EDUCATION: Education details: there-ex, HEP Person educated: Patient Education method: Explanation, Demonstration, Tactile cues, Verbal cues, and Handouts Education comprehension: verbalized understanding and returned demonstration  HOME EXERCISE PROGRAM: Access Code: [email or web address] URL: [email or web address] Date: 06/08/2022 Prepared by: Joneen Boers  Exercises - Seated Scapular Retraction  - 3 x daily - 7 x weekly - 3 sets - 10 reps - 5 seconds hold - Standing Partial Lunge  - 1 x daily - 7 x weekly - 2 sets - 10 reps    Yellow band - Supine Shoulder Horizontal Abduction with Dumbbells  - 1 x daily - 7 x weekly - 3 sets - 10 reps - 5 seconds hold - Hooklying Clamshell with Resistance  - 1 x daily - 7 x weekly - 3 sets - 10 reps  (Green band)  - Supine Hip Adduction Isometric with Ball  - 1 x daily - 7 x weekly  - 3 sets - 10 reps - 5 seconds hold -  Supine Posterior Pelvic Tilt  - 1 x daily - 7 x weekly - 3 sets - 10 reps - 5 seconds hold   PT Short Term Goals - 06/03/22 1357       PT SHORT TERM GOAL #1   Title Pt will be independent with her initial HEP to improve strength, balance, function, and ability to squat and perform stand <> floor transfers with less difficulty.    Time 3    Period Weeks    Status New    Target Date 06/25/22              PT Long Term Goals - 06/03/22 1358       PT LONG TERM GOAL #1   Title Pt will be able to perform a stand to floor to stand transfer, mod A at least 3x with minimal to no B  knee pain to promote ability to get up from the floor at home with less difficulty.    Baseline Difficulty performing floor to stand transfers with knee symptoms (06/03/2022)    Time 6    Period Weeks    Status New    Target Date 07/16/22      PT LONG TERM GOAL #2   Title Pt will improver her DGI score by at least 4 points as a demonstration of improved balance.    Baseline DGI score 19 (06/03/2022)    Time 6    Period Weeks    Status New    Target Date 07/16/22      PT LONG TERM GOAL #3   Title Pt will improve bilateral hip extension and abduction strength to promote ability to ambulate, perform standing tasks, negotiate stairs with less difficulty.    Baseline Seated manually resisted hip extension 3+/5 R, 4-/5 L, S/L hip abduction 4/5 R and L (06/03/2022)    Time 6    Period Weeks    Status New    Target Date 07/16/22                  Salem Caster. Fairly IV, PT, DPT Physical Therapist- [facility]  06/24/2022, 10:29 AM

## 2022-06-25 ENCOUNTER — Ambulatory Visit: Payer: Medicare HMO | Admitting: Pharmacist

## 2022-06-25 NOTE — Progress Notes (Signed)
Care Management & Coordination Services Pharmacy Note  06/25/2022 Name:  Jamie Curry MRN:  [id number] DOB:  [birth year]  Summary: F/U visit -HLD: LDL 136 (08/2021) while pt was not adherent to statin; she affirms adherence to rosuvastatin 10 mg daily today now -Anxiety: pt voiced interest in seeing behavioral health   Recommendations/Changes made from today's visit: -Repeat lipid panel at next OV -advised pt to schedule annual visit at any time this year -Provided phone number to schedule behavioral health appt [phone])  Follow up plan: -[facility] will call patient 3 months for general update -Pharmacist follow up PRN -Oncology appt 07/08/22    Subjective: Jamie Curry is an 77 y.o. year old female who is a primary patient of Damita Dunnings, Elveria Rising, MD.  The care coordination team was consulted for assistance with disease management and care coordination needs.    Engaged with patient by telephone for follow up visit.  Recent office visits: 05/14/22 NP Carlis Abbott OV: COVID - discussed sx may linger. Rx tessalon. Consider famotidine for cough.  05/07/22 Dr Copland OV: COVID - Rx Paxlovid.  04/10/22 Dr Damita Dunnings OV: URI - stopped biaxin after 3 days d/t headache. Try clarithromycin 250 mg BID.   03/11/22 Dr Lorelei Pont OV: sinusitis - rx clarithromycin  02/27/22 Dr Damita Dunnings OV: HTN, edema. High sodium diet - discussed limiting sodium. Use compression stockings. Try 1/2 amlodipine.  12/12/21 AWV 12/01/21 phone note - referral to psychology 10/30/21 Dr Damita Dunnings OV: mood f/u - try 1/2 xanax in AM and 1 PM. Consider buspar.   09/03/21 Dr Damita Dunnings OV: establish care - mild shoulder arthritis. Refer to PT. Mild LFT elevation, order liver US. Cardiac murmur, order ECHO.  Recent consult visits: 05/04/22 Dr Ninfa Linden (Ortho): knee pain - Xrays done. Order PT.  09/03/21 Dr Nehemiah Massed (Dermatology): [facility] - destruction of lesions.   Hospital visits: None in previous 6 months   Objective:  Lab  Results  Component Value Date   CREATININE 0.83 05/14/2022   BUN 15 05/14/2022   GFR 68.11 05/14/2022   GFRNONAA >60 06/07/2020   GFRAA >60 05/16/2019   NA 129 (L) 05/14/2022   K 3.4 (L) 05/14/2022   CALCIUM 9.1 05/14/2022   CO2 31 05/14/2022   GLUCOSE 113 (H) 05/14/2022    Lab Results  Component Value Date/Time   HGBA1C (H) 06/18/2010 06:32 PM    6.0 (NOTE)                                                                       According to the ADA Clinical Practice Recommendations for 2011, when HbA1c is used as a screening test:   >=6.5%   Diagnostic of Diabetes Mellitus           (if abnormal result  is confirmed)  5.7-6.4%   Increased risk of developing Diabetes Mellitus  References:Diagnosis and Classification of Diabetes Mellitus,Diabetes [id number] 1):S62-S69 and Standards of Medical Care in         Diabetes - 2011,Diabetes Care,2011,34  (Suppl 1):S11-S61.   GFR 68.11 05/14/2022 12:50 PM   GFR 61.08 02/27/2022 11:27 AM    Last diabetic Eye exam: No results found for: "HMDIABEYEEXA"  Last diabetic Foot exam: No results found for: "HMDIABFOOTEX"   Lab Results  Component Value Date   CHOL 237 (H) 09/01/2021   HDL 72.80 09/01/2021   LDLCALC 136 (H) 09/01/2021   LDLDIRECT 161.2 11/17/2011   TRIG 141.0 09/01/2021   CHOLHDL 3 09/01/2021       Latest Ref Rng & Units 09/01/2021   11:59 AM 06/07/2020    9:42 AM 05/16/2019    2:07 PM  Hepatic Function  Total Protein 6.0 - 8.3 g/dL 7.3  7.9  7.9   Albumin 3.5 - 5.2 g/dL 4.5  4.5  4.5   AST 0 - 37 U/L 34  32  26   ALT 0 - 35 U/L 58  32  28   Alk Phosphatase 39 - 117 U/L 153  93  99   Total Bilirubin 0.2 - 1.2 mg/dL 0.8  0.9  0.7     Lab Results  Component Value Date/Time   TSH 2.83 09/01/2021 11:59 AM   TSH 3.125 06/18/2010 06:32 PM       Latest Ref Rng & Units 05/14/2022   12:50 PM 09/01/2021   11:59 AM 06/07/2020    9:42 AM  CBC  WBC 4.0 - 10.5 K/uL 4.6  4.7  4.4   Hemoglobin 12.0 - 15.0 g/dL 12.5  12.6   12.9   Hematocrit 36.0 - 46.0 % 36.5  37.7  39.3   Platelets 150.0 - 400.0 K/uL 207.0  206.0  240     Lab Results  Component Value Date/Time   VITAMINB12 233 06/20/2010 02:05 PM    Clinical ASCVD: No  The 10-year ASCVD risk score (Arnett DK, et al., 2019) is: 32.9%   Values used to calculate the score:     Age: 77 years     Sex: Female     Is Non-Hispanic African American: No     Diabetic: No     Tobacco smoker: No     Systolic Blood Pressure: 123456 mmHg     Is BP treated: Yes     HDL Cholesterol: 72.8 mg/dL     Total Cholesterol: 237 mg/dL       05/14/2022   12:26 PM 05/07/2022   10:50 AM 12/12/2021    8:49 AM  Depression screen PHQ 2/9  Decreased Interest 0 0 0  Down, Depressed, Hopeless 1 2 1   PHQ - 2 Score 1 2 1   Altered sleeping 1 1 1   Tired, decreased energy 0 1 1  Change in appetite 1 1 0  Feeling bad or failure about yourself  1  0  Trouble concentrating 0 0 0  Moving slowly or fidgety/restless 0 1 0  Suicidal thoughts 0 0 0  PHQ-9 Score 4 6 3   Difficult doing work/chores Somewhat difficult Not difficult at all Not difficult at all       05/14/2022   12:26 PM 05/07/2022   10:51 AM 10/30/2021    9:39 AM  GAD 7 : Generalized Anxiety Score  Nervous, Anxious, on Edge 1 1 1   Control/stop worrying 1 1 1   Worry too much - different things 1 1 1   Trouble relaxing 1 0   Restless 0 0 0  Easily annoyed or irritable 2 1 1   Afraid - awful might happen 2 1 0  Total GAD 7 Score 8 5   Anxiety Difficulty Somewhat difficult Not difficult at all Not difficult at all     Social History   Tobacco Use  Smoking Status Never  Smokeless Tobacco Never   BP Readings from Last 3 Encounters:  05/14/22 (!) 146/66  05/07/22 132/60  04/10/22 122/65   Pulse Readings from Last 3 Encounters:  05/14/22 70  05/07/22 84  04/10/22 65   Wt Readings from Last 3 Encounters:  05/14/22 187 lb (84.8 kg)  05/07/22 190 lb 4 oz (86.3 kg)  04/10/22 188 lb (85.3 kg)   BMI Readings from  Last 3 Encounters:  05/14/22 32.61 kg/m  05/07/22 33.17 kg/m  04/10/22 32.78 kg/m    Allergies  Allergen Reactions   Penicillins Anaphylaxis    REACTION: severe anaphylaxis Has patient had a PCN reaction causing immediate rash, facial/tongue/throat swelling, SOB or lightheadedness with hypotension: Yes Has patient had a PCN reaction causing severe rash involving mucus membranes or skin necrosis: No Has patient had a PCN reaction that required hospitalization No Has patient had a PCN reaction occurring within the last 10 years: No If all of the above answers are "NO", then may proceed with Cephalosporin use.    Zithromax [Azithromycin] Rash    z-pack = rash   Anastrozole Other (See Comments)    Extreme sweating   Cefuroxime Axetil Other (See Comments)    unknown   Codeine Other (See Comments)    unknown   Erythromycin Other (See Comments)    unknown   Nitrofurantoin Other (See Comments)    unknown   Ofloxacin Other (See Comments)    unknown   Prednisone Other (See Comments)   Propoxyphene Other (See Comments)   Tetracycline Other (See Comments)    unknown   Sulfa Antibiotics Other (See Comments) and Rash    Medications Reviewed Today     Reviewed by Charlton Haws, [facility] (Pharmacist) on 06/25/22 at 1120  Med List Status: <None>   Medication Order Taking? Sig Documenting Provider Last Dose Status Informant  acetaminophen (TYLENOL) 500 MG tablet [id number] Yes Take 1,000 mg by mouth every 6 (six) hours as needed for mild pain. [provider] Taking Active Self  albuterol (VENTOLIN HFA) 108 (90 Base) MCG/ACT inhaler [id number] Yes Inhale into the lungs every 6 (six) hours as needed for wheezing or shortness of breath. [provider] Taking Active   ALPRAZolam Duanne Moron) 0.5 MG tablet [id number] Yes TAKE ONE HALF (1/2) TABLET BY MOUTH IN THE MORNING AND ONE TABLET IN THE EVENING IF NEEDED. SEDATION CAUTION Tonia Ghent, MD Taking Active   amLODipine  (NORVASC) 5 MG tablet [id number] Yes Take 0.5-1 tablets (2.5-5 mg total) by mouth daily.  Patient taking differently: Take 5 mg by mouth daily.   Tonia Ghent, MD Taking Active   benzonatate (TESSALON) 200 MG capsule [id number] Yes Take 1 capsule (200 mg total) by mouth 3 (three) times daily as needed for cough. Pleas Koch, NP Taking Active   diclofenac (VOLTAREN) 50 MG EC tablet [id number] Yes TAKE ONE TABLET BY MOUTH ONCE A DAY AS NEEDED WITH FOOD. Tonia Ghent, MD Taking Active   escitalopram (LEXAPRO) 20 MG tablet [id number] Yes TAKE 1 TABLET BY MOUTH ONCE A DAY FOR 30DAYS Tonia Ghent, MD Taking Active   fluticasone [facility]) 50 MCG/ACT nasal spray [id number] Yes Place into both nostrils. [provider] Taking Active   hydrochlorothiazide (HYDRODIURIL) 25 MG tablet [id number] Yes TAKE ONE TABLET BY MOUTH ONCE A DAY Tonia Ghent, MD Taking Active   loperamide (IMODIUM A-D) 2 MG tablet [id number] Yes Take 1 tablet (2 mg total) by mouth 4 (four) times daily as needed for diarrhea or loose stools. Tonia Ghent, MD Taking Active  montelukast (SINGULAIR) 10 MG tablet [id number] Yes Take 1 tablet (10 mg total) by mouth at bedtime. Tonia Ghent, MD Taking Active   NYSTATIN powder [id number] Yes APPLY 1 APPLICATION TOPICALLY 3 TIMES DAILY Sindy Guadeloupe, MD Taking Active   rosuvastatin (CRESTOR) 10 MG tablet [id number] Yes TAKE ONE TABLET BY MOUTH EVERY NIGHT AT BEDTIME Tonia Ghent, MD Taking Active             SDOH:  (Social Determinants of Health) assessments and interventions performed: No SDOH Interventions    Flowsheet Row Clinical Support from 12/12/2021 in [place] at [place] Interventions   Food Insecurity Interventions Intervention Not Indicated  Housing Interventions Intervention Not Indicated  Transportation Interventions Intervention Not Indicated  Depression Interventions/Treatment  Medication  Financial Strain  Interventions Intervention Not Indicated  Physical Activity Interventions Intervention Not Indicated  Stress Interventions Intervention Not Indicated  Social Connections Interventions Intervention Not Indicated       Medication Assistance: None required.  Patient affirms current coverage meets needs.  Medication Access: Within the past 30 days, how often has patient missed a dose of medication? 0 Is a pillbox or other method used to improve adherence? Yes  Factors that may affect medication adherence? no barriers identified Are meds synced by current pharmacy? No  Are meds delivered by current pharmacy? No  Does patient experience delays in picking up medications due to transportation concerns? No   [facility] Services Reviewed: Is patient disadvantaged to use [facility]?: Yes  Current Rx insurance plan: HTA Name and location of Current pharmacy:  [facility] [place] [address] Phone: [phone] Fax: [phone]  [facility] services reviewed with patient today?: No  Patient requests to transfer care to [facility]?: No  Reason patient declined to change pharmacies: Disadvantaged due to insurance/mail order  Compliance/Adherence/Medication fill history: Care Gaps: None  Star-Rating Drugs: Rosuvastatin - PDC 86%   ASSESSMENT / PLAN  Hypertension (BP goal <130/80) -Controlled - per recent clinic visits -BP at home: SBP 120-130 -Current treatment: Amlodipine 5 mg daily - Appropriate, Effective, Safe, Accessible HCTZ 25 mg daily -Appropriate, Effective, Safe, Accessible -Medications previously tried: n/a  -Denies hypotensive/hypertensive symptoms -Educated on BP goals and benefits of medications for prevention of heart attack, stroke and kidney damage; -Counseled to monitor BP at home periodically -Recommended to continue current medication  Hyperlipidemia: (LDL goal <  70) -Uncontrolled - LDL 136 (08/2021) above goal, but patient was not adherent to statin at the time; she has since restarted statin therapy and endorses daily compliance -Current treatment: Rosuvastatin 10 mg daily -Appropriate, Query Effective -Medications previously tried: simvastatin -Educated on Cholesterol goals; Benefits of statin for ASCVD risk reduction; -Recommended to continue current medication; repeat lipid panel at next OV  Asthma (Goal: control symptoms and prevent exacerbations) -Controlled -Pulmonary function testing: not on file -Exacerbations requiring treatment in last 6 months: 0 -Current treatment  Montelukast 10 mg daily HS - Appropriate, Effective, Safe, Accessible -Medications previously tried: n/a  -Recommended to continue current medication  Depression/Anxiety (Goal: manage symptoms) -Query Controlled - pt is interested in seeing behavioral health in office -PHQ9: 4 (05/2022) - mild depression -GAD7: 8 (05/2022) - mild anxiety -Connected with PCP for mental health support -Current treatment: Escitalopram 20 mg daily - Appropriate, Effective, Safe, Accessible Alprazolam 0.5 mg - 1/2 tab AM, 1 tab PM - Appropriate, Effective, Safe, Accessible -Medications previously tried/failed: n/a -Educated on Benefits  of medication for symptom control;Benefits of cognitive-behavioral therapy with or without medication -Recommended to continue current medication; provided phone number to schedule behavioral health appt in office  Osteoarthritis (Goal: manage symptoms) -Controlled -Current treatment  Diclofenac 50 mg BID - Appropriate, Effective, Safe, Accessible -Medications previously tried: n/a -Reviewed to take diclofenac with food  -Recommended to continue current medication  Health Maintenance -Vaccine gaps: Flu, Prevnar 20, TDAP -Hx Multiple drug allergies to antibiotics   Charlene Brooke, PharmD, [place] Pharmacist [place] at [facility] [phone]

## 2022-06-26 NOTE — Patient Instructions (Signed)
Visit Information  Phone number for Pharmacist: [phone]  Thank you for meeting with me to discuss your medications! Below is a summary of what we talked about during the visit:   Recommendations/Changes made from today's visit: -Repeat lipid panel at next OV -advised pt to schedule annual visit at any time this year -Provided phone number to schedule behavioral health appt [phone])  Follow up plan: -[place] will call patient 3 months for general update -Pharmacist follow up PRN -Oncology appt 07/08/22   Charlene Brooke, PharmD, BCACP Clinical Pharmacist [facility] [phone]

## 2022-06-29 ENCOUNTER — Ambulatory Visit: Payer: Medicare HMO

## 2022-07-01 ENCOUNTER — Ambulatory Visit: Payer: Medicare HMO

## 2022-07-01 DIAGNOSIS — M6281 Muscle weakness (generalized): Secondary | ICD-10-CM

## 2022-07-01 DIAGNOSIS — M25562 Pain in left knee: Secondary | ICD-10-CM | POA: Diagnosis not present

## 2022-07-01 DIAGNOSIS — G8929 Other chronic pain: Secondary | ICD-10-CM | POA: Diagnosis not present

## 2022-07-01 DIAGNOSIS — M25561 Pain in right knee: Secondary | ICD-10-CM | POA: Diagnosis not present

## 2022-07-01 NOTE — Therapy (Signed)
OUTPATIENT PHYSICAL THERAPY TREATMENT NOTE   Patient Name: Jamie Curry MRN: [id number] DOB:12-Apr-1946, 77 y.o., female Today's Date: 07/01/2022  PCP: Tonia Ghent, MD  REFERRING PROVIDER: Mcarthur Rossetti, MD   END OF SESSION:  PT End of Session - 07/01/22 1020     Visit Number 8    Number of Visits 13    Date for PT Re-Evaluation 07/16/22    PT Start Time 1021    PT Stop Time 1059    PT Time Calculation (min) 38 min    Equipment Utilized During Treatment Gait belt    Activity Tolerance Patient tolerated treatment well    Behavior During Therapy WFL for tasks assessed/performed                  Past Medical History:  Diagnosis Date   Anxiety    Asthma    Breast cancer ([place]) 02/07/2016   left breast invasive lobular carcinoma   CAD (coronary artery disease)    mild CAD by cath in 2001   Depression    Diverticulosis    Heart murmur    HTN (hypertension)    Hyperlipidemia    Palpitations    Personal history of radiation therapy 2018   left breast ca   Squamous cell carcinoma of skin 12/09/2017   R lat base of neck, SCCIS   Syncope    Pre-syncope   Past Surgical History:  Procedure Laterality Date   ABDOMINAL HYSTERECTOMY     BREAST BIOPSY Left 02/07/2016   grade II invasive and in situ mammary carcinoma   BREAST CYST ASPIRATION Left 02/13/2016   cyst   BREAST LUMPECTOMY Left 03/03/2016   invasive lobular carcinoma, clear margins, METASTATIC LOBULAR CARCINOMA, 2.5 MM, IN ONE LYMPH NODE (1/1).    CHOLECYSTECTOMY     KNEE ARTHROSCOPY Right    PARTIAL MASTECTOMY WITH NEEDLE LOCALIZATION Left 03/03/2016   Procedure: PARTIAL MASTECTOMY WITH NEEDLE LOCALIZATION;  Surgeon: Leonie Green, MD;  Location: [facility] ORS;  Service: General;  Laterality: Left;   SENTINEL NODE BIOPSY Left 03/03/2016   Procedure: SENTINEL NODE BIOPSY;  Surgeon: Leonie Green, MD;  Location: [facility] ORS;  Service: General;  Laterality: Left;   TUBAL LIGATION      Patient Active Problem List   Diagnosis Date Noted   COVID-19 05/14/2022   Edema 03/01/2022   Advance care planning 09/03/2021   Left shoulder pain 09/03/2021   Asthma 09/03/2021   Anxiety 09/03/2021   Unilateral primary osteoarthritis, left knee 08/19/2016   Unilateral primary osteoarthritis, right knee 08/19/2016   Hot flashes 03/25/2016   Breast cancer of upper-outer quadrant of left female breast ([place]) 02/14/2016   Hyperlipidemia 11/12/2011   HTN (hypertension) 12/29/2010   Palpitations 12/29/2010    REFERRING DIAG: [id number] (ICD-10-CM) - Chronic pain of left knee M25.561,G89.29 (ICD-10-CM) - Chronic pain of right knee   THERAPY DIAG:  Muscle weakness (generalized)  Chronic pain of left knee  Chronic pain of right knee  Rationale for Evaluation and Treatment Rehabilitation  PERTINENT HISTORY: Chronic B knee pain. [facility] keep bothering her. Walked on the treadmil and did the bicycle which bothers her knee. Had a gel shot in both knees about 3 years ago which helped for a couple years. B knees are not hurting but R knee feels like it buckles once in a while. Used to walk her dog which died about 5 months ago. Doctor wants her  to do PT for strength and balance. R knee bothers her more than her L . The diclofenac helps which she has been taking about a year.   PRECAUTIONS: possible fall risk   SUBJECTIVE:   SUBJECTIVE STATEMENT:Knees are doing alright. No pain or discomfort. Balance is better.    PAIN:  Are you having pain?     No latex allergies Blood pressure is controlled per pt.     TODAY'S TREATMENT:                                                                                                                                         DATE: 07/01/2022    Therapeutic exercise  Directed patient with gait with normal gait speed, with changes in speed, 180 degree pivot turn, with R and L cervical rotation position,  with cervical flexion and extension position, stepping around obstacles, stepping over an obstacle, ascending and descending 4 regular steps with UE assist   Stand to floor transfers with use of chair as needed 2x  SBA on first repetition,   Min A on second repetition secondary to LOB when bending L knee to rest herself on top of her L knee. Min A PT assist to recover.   Seated L knee flexion AROM 131 degrees   Standing L knee flexion stretch onto 2nd stair step with B UE assist 2x5 seconds. L knee discomfort.   Seated L knee extension resisting green band 10x2   Improved exercise technique, movement at target joints, use of target muscles after mod verbal, visual, tactile cues.       Response to treatment Pt tolerated session well without aggravation of symptoms.      Clinical impression   Pt demonstrates overall improved balance since initial evaluation. Still demonstrates difficulty with stand <> sit <> floor transfers secondary to L knee pain and weakness when transitioning from sit to floor. Worked on improving L quadriceps strength to help address. Pt tolerated session well without aggravation of symptoms. No knee pain reported after session. Pt will benefit from continued skilled physical therapy services to decrease pain, improve strength, balance, and function.      PATIENT EDUCATION: Education details: there-ex, HEP Person educated: Patient Education method: Explanation, Demonstration, Tactile cues, Verbal cues, and Handouts Education comprehension: verbalized understanding and returned demonstration  HOME EXERCISE PROGRAM: Access Code: [email or web address] URL: [email or web address] Date: 06/08/2022 Prepared by: Joneen Boers  Exercises - Seated Scapular Retraction  - 3 x daily - 7 x weekly - 3 sets - 10 reps - 5 seconds hold - Standing Partial Lunge  - 1 x daily - 7 x weekly - 2 sets - 10 reps    Yellow band - Supine Shoulder Horizontal Abduction with  Dumbbells  - 1 x daily - 7 x weekly - 3 sets - 10 reps - 5 seconds hold - Hooklying Clamshell with Resistance  - 1  x daily - 7 x weekly - 3 sets - 10 reps  (Green band)  - Supine Hip Adduction Isometric with Ball  - 1 x daily - 7 x weekly - 3 sets - 10 reps - 5 seconds hold - Supine Posterior Pelvic Tilt  - 1 x daily - 7 x weekly - 3 sets - 10 reps - 5 seconds hold   PT Short Term Goals - 07/01/22 1036       PT SHORT TERM GOAL #1   Title Pt will be independent with her initial HEP to improve strength, balance, function, and ability to squat and perform stand <> floor transfers with less difficulty.    Baseline Doing her HEP, no questions (07/01/2022)    Time 3    Period Weeks    Status Achieved    Target Date 06/25/22              PT Long Term Goals - 07/01/22 1036       PT LONG TERM GOAL #1   Title Pt will be able to perform a stand to floor to stand transfer, mod A at least 3x with minimal to no B  knee pain to promote ability to get up from the floor at home with less difficulty.    Baseline Difficulty performing floor to stand transfers with knee symptoms (06/03/2022); able to perform one transfers with use of chair, SBA during first repetition, LOB wiht Min PT assist during 2nd repetition (07/01/2022)b    Time 6    Period Weeks    Status New    Target Date 07/16/22      PT LONG TERM GOAL #2   Title Pt will improver her DGI score by at least 4 points as a demonstration of improved balance.    Baseline DGI score 19 (06/03/2022); 21 (07/01/2022)    Time 6    Period Weeks    Status Achieved    Target Date 07/16/22      PT LONG TERM GOAL #3   Title Pt will improve bilateral hip extension and abduction strength to promote ability to ambulate, perform standing tasks, negotiate stairs with less difficulty.    Baseline Seated manually resisted hip extension 3+/5 R, 4-/5 L, S/L hip abduction 4/5 R and L (06/03/2022)    Time 6    Period Weeks    Status New    Target Date 07/16/22               Plan - 07/01/22 1019     Clinical Impression Statement Pt demonstrates overall improved balance since initial evaluation. Still demonstrates difficulty with stand <> sit <> floor transfers secondary to L knee pain and weakness when transitioning from sit to floor. Worked on improving L quadriceps strength to help address. Pt tolerated session well without aggravation of symptoms. No knee pain reported after session. Pt will benefit from continued skilled physical therapy services to decrease pain, improve strength, balance, and function.    Personal Factors and Comorbidities Comorbidity 3+;Age;Fitness    Comorbidities Anxiety, hx of breast CA, depression, HTN, syncope    Examination-Activity Limitations Squat;Other   walking while looking around   Stability/Clinical Decision Making Stable/Uncomplicated    Rehab Potential Fair    PT Frequency 2x / week    PT Duration 6 weeks    PT Treatment/Interventions Therapeutic activities;Therapeutic exercise;Balance training;Neuromuscular re-education;Patient/family education;Manual techniques;Dry needling;Vestibular;Canalith Repostioning;Electrical Stimulation;Iontophoresis 4mg /ml Dexamethasone;Gait training;Stair training    PT Next Visit Plan scapular strengthening, thoracic  extension, trunk and hip strength, balance, manual techniques, modalities PRN    PT Home Exercise Plan Medbridge Access Code: [email or web address]    Consulted and Agree with Plan of Care Patient                Joneen Boers PT, DPT  07/01/2022, 11:09 AM

## 2022-07-07 ENCOUNTER — Ambulatory Visit: Payer: Medicare HMO

## 2022-07-07 DIAGNOSIS — G8929 Other chronic pain: Secondary | ICD-10-CM

## 2022-07-07 DIAGNOSIS — M6281 Muscle weakness (generalized): Secondary | ICD-10-CM | POA: Diagnosis not present

## 2022-07-07 DIAGNOSIS — M25561 Pain in right knee: Secondary | ICD-10-CM | POA: Diagnosis not present

## 2022-07-07 DIAGNOSIS — M25562 Pain in left knee: Secondary | ICD-10-CM | POA: Diagnosis not present

## 2022-07-07 NOTE — Therapy (Signed)
OUTPATIENT PHYSICAL THERAPY TREATMENT NOTE   Patient Name: Jamie Curry MRN: [id number] DOB:[birth year], 77 y.o., female Today's Date: 07/07/2022  PCP: Tonia Ghent, MD  REFERRING PROVIDER: Mcarthur Rossetti, MD   END OF SESSION:  PT End of Session - 07/07/22 1146     Visit Number 9    Number of Visits 13    Date for PT Re-Evaluation 07/16/22    PT Start Time 1147    PT Stop Time 86    PT Time Calculation (min) 43 min    Equipment Utilized During Treatment Gait belt    Activity Tolerance Patient tolerated treatment well    Behavior During Therapy WFL for tasks assessed/performed                   Past Medical History:  Diagnosis Date   Anxiety    Asthma    Breast cancer ([place]) 02/07/2016   left breast invasive lobular carcinoma   CAD (coronary artery disease)    mild CAD by cath in 2001   Depression    Diverticulosis    Heart murmur    HTN (hypertension)    Hyperlipidemia    Palpitations    Personal history of radiation therapy 2018   left breast ca   Squamous cell carcinoma of skin 12/09/2017   R lat base of neck, SCCIS   Syncope    Pre-syncope   Past Surgical History:  Procedure Laterality Date   ABDOMINAL HYSTERECTOMY     BREAST BIOPSY Left 02/07/2016   grade II invasive and in situ mammary carcinoma   BREAST CYST ASPIRATION Left 02/13/2016   cyst   BREAST LUMPECTOMY Left 03/03/2016   invasive lobular carcinoma, clear margins, METASTATIC LOBULAR CARCINOMA, 2.5 MM, IN ONE LYMPH NODE (1/1).    CHOLECYSTECTOMY     KNEE ARTHROSCOPY Right    PARTIAL MASTECTOMY WITH NEEDLE LOCALIZATION Left 03/03/2016   Procedure: PARTIAL MASTECTOMY WITH NEEDLE LOCALIZATION;  Surgeon: Leonie Green, MD;  Location: [facility] ORS;  Service: General;  Laterality: Left;   SENTINEL NODE BIOPSY Left 03/03/2016   Procedure: SENTINEL NODE BIOPSY;  Surgeon: Leonie Green, MD;  Location: [facility];  Service: General;  Laterality: Left;   TUBAL LIGATION      Patient Active Problem List   Diagnosis Date Noted   COVID-19 05/14/2022   Edema 03/01/2022   Advance care planning 09/03/2021   Left shoulder pain 09/03/2021   Asthma 09/03/2021   Anxiety 09/03/2021   Unilateral primary osteoarthritis, left knee 08/19/2016   Unilateral primary osteoarthritis, right knee 08/19/2016   Hot flashes 03/25/2016   Breast cancer of upper-outer quadrant of left female breast ([place]) 02/14/2016   Hyperlipidemia 11/12/2011   HTN (hypertension) 12/29/2010   Palpitations 12/29/2010    REFERRING DIAG: [id number] (ICD-10-CM) - Chronic pain of left knee M25.561,G89.29 (ICD-10-CM) - Chronic pain of right knee   THERAPY DIAG:  Muscle weakness (generalized)  Chronic pain of left knee  Chronic pain of right knee  Rationale for Evaluation and Treatment Rehabilitation  PERTINENT HISTORY: Chronic B knee pain. [facility] keep bothering her. Walked on the treadmil and did the bicycle which bothers her knee. Had a gel shot in both knees about 3 years ago which helped for a couple years. B knees are not hurting but R knee feels like it buckles once in a while. Used to walk her dog which died about 5 months ago. Doctor wants  her to do PT for strength and balance. R knee bothers her more than her L . The diclofenac helps which she has been taking about a year.   PRECAUTIONS: possible fall risk   SUBJECTIVE:   SUBJECTIVE STATEMENT: Tried practicing getting down and getting back up at home with her couch. The arthritis in L knee makes it hard to bend.      PAIN:  Are you having pain?     No latex allergies Blood pressure is controlled per pt.     TODAY'S TREATMENT:                                                                                                                                         DATE: 07/07/2022    Therapeutic exercise  Setaed knee flexion AAROM   Seated manually resisted knee extenstion form  close to end range knee flexion  L 10x   R 10x  Seated knee extension blue from end range knee flexion to almost 90 degrees flexion to simulate knee angle during sit to floor transfers to improve strength  L 8x  R 6x  Challenging per pt.   Static standing mini lunge with contralateral UE assist   L 10x2  R 10x2  SLS dead lift with contralateral UE assist   R 10x3  L 10x3  Side stepping with red band around knees 32 ft to the R and 32 ft to the L   Then with red band around ankles 32 ft to the R and 32 ft to the L    Improved exercise technique, movement at target joints, use of target muscles after mod verbal, visual, tactile cues.       Response to treatment Pt tolerated session well without aggravation of symptoms.      Clinical impression Worked on improving quadriceps strengthening throughout range as well as glute strengthening to promote ability to perform sit <> floor transfers. Pt will benefit from continued skilled physical therapy services to decrease pain, improve strength, balance, and function.      PATIENT EDUCATION: Education details: there-ex, HEP Person educated: Patient Education method: Explanation, Demonstration, Tactile cues, Verbal cues, and Handouts Education comprehension: verbalized understanding and returned demonstration  HOME EXERCISE PROGRAM: Access Code: [email or web address] URL: [email or web address] Date: 06/08/2022 Prepared by: Joneen Boers  Exercises - Seated Scapular Retraction  - 3 x daily - 7 x weekly - 3 sets - 10 reps - 5 seconds hold - Standing Partial Lunge  - 1 x daily - 7 x weekly - 2 sets - 10 reps    Yellow band - Supine Shoulder Horizontal Abduction with Dumbbells  - 1 x daily - 7 x weekly - 3 sets - 10 reps - 5 seconds hold - Hooklying Clamshell with Resistance  - 1 x daily - 7 x weekly - 3 sets - 10 reps  (Green band)  - Supine  Hip Adduction Isometric with Ball  - 1 x daily - 7 x weekly - 3 sets - 10 reps - 5  seconds hold - Supine Posterior Pelvic Tilt  - 1 x daily - 7 x weekly - 3 sets - 10 reps - 5 seconds hold   PT Short Term Goals - 07/01/22 1036       PT SHORT TERM GOAL #1   Title Pt will be independent with her initial HEP to improve strength, balance, function, and ability to squat and perform stand <> floor transfers with less difficulty.    Baseline Doing her HEP, no questions (07/01/2022)    Time 3    Period Weeks    Status Achieved    Target Date 06/25/22              PT Long Term Goals - 07/01/22 1036       PT LONG TERM GOAL #1   Title Pt will be able to perform a stand to floor to stand transfer, mod A at least 3x with minimal to no B  knee pain to promote ability to get up from the floor at home with less difficulty.    Baseline Difficulty performing floor to stand transfers with knee symptoms (06/03/2022); able to perform one transfers with use of chair, SBA during first repetition, LOB wiht Min PT assist during 2nd repetition (07/01/2022)b    Time 6    Period Weeks    Status New    Target Date 07/16/22      PT LONG TERM GOAL #2   Title Pt will improver her DGI score by at least 4 points as a demonstration of improved balance.    Baseline DGI score 19 (06/03/2022); 21 (07/01/2022)    Time 6    Period Weeks    Status Achieved    Target Date 07/16/22      PT LONG TERM GOAL #3   Title Pt will improve bilateral hip extension and abduction strength to promote ability to ambulate, perform standing tasks, negotiate stairs with less difficulty.    Baseline Seated manually resisted hip extension 3+/5 R, 4-/5 L, S/L hip abduction 4/5 R and L (06/03/2022)    Time 6    Period Weeks    Status New    Target Date 07/16/22              Plan - 07/07/22 1145     Clinical Impression Statement Worked on improving quadriceps strengthening throughout range as well as glute strengthening to promote ability to perform sit <> floor transfers. Pt will benefit from continued skilled  physical therapy services to decrease pain, improve strength, balance, and function.    Personal Factors and Comorbidities Comorbidity 3+;Age;Fitness    Comorbidities Anxiety, hx of breast CA, depression, HTN, syncope    Examination-Activity Limitations Squat;Other   walking while looking around   Stability/Clinical Decision Making Stable/Uncomplicated    Rehab Potential Fair    PT Frequency 2x / week    PT Duration 6 weeks    PT Treatment/Interventions Therapeutic activities;Therapeutic exercise;Balance training;Neuromuscular re-education;Patient/family education;Manual techniques;Dry needling;Vestibular;Canalith Repostioning;Electrical Stimulation;Iontophoresis 4mg /ml Dexamethasone;Gait training;Stair training    PT Next Visit Plan scapular strengthening, thoracic extension, trunk and hip strength, balance, manual techniques, modalities PRN    PT Home Exercise Plan Medbridge Access Code: [email or web address]    Consulted and Agree with Plan of Care Patient                Joneen Boers PT,  DPT  07/07/2022, 3:37 PM

## 2022-07-08 ENCOUNTER — Inpatient Hospital Stay: Payer: Medicare HMO | Attending: Oncology | Admitting: Oncology

## 2022-07-08 ENCOUNTER — Other Ambulatory Visit: Payer: Self-pay | Admitting: *Deleted

## 2022-07-08 ENCOUNTER — Encounter: Payer: Self-pay | Admitting: Oncology

## 2022-07-08 VITALS — BP 114/72 | HR 60 | Temp 97.0°F | Resp 18 | Ht 64.0 in | Wt 186.9 lb

## 2022-07-08 DIAGNOSIS — Z853 Personal history of malignant neoplasm of breast: Secondary | ICD-10-CM | POA: Diagnosis not present

## 2022-07-08 DIAGNOSIS — Z08 Encounter for follow-up examination after completed treatment for malignant neoplasm: Secondary | ICD-10-CM | POA: Diagnosis not present

## 2022-07-08 DIAGNOSIS — L309 Dermatitis, unspecified: Secondary | ICD-10-CM | POA: Diagnosis not present

## 2022-07-08 MED ORDER — NYSTATIN 100000 UNIT/GM EX POWD: CUTANEOUS | 3 refills | Status: DC

## 2022-07-08 NOTE — Progress Notes (Signed)
No concerns for the provider. 

## 2022-07-08 NOTE — Progress Notes (Signed)
Hematology/Oncology Consult note [facility]  Telephone:([phone] [phone]  Patient Care Team: Tonia Ghent, MD as PCP - General (Family Medicine) Charlton Haws, [facility] as Pharmacist (Pharmacist)   Name of the patient: Jamie Curry  [id number]  18-Jan-1946   Date of visit: 07/08/22  Diagnosis- Stage IIb pT2N1a(sn)cM0 invasive lobular carcinoma of the left breast ER 10% positive, PR 10% positive, HER-2/neu negative status post lumpectomy/SLND   Chief complaint/ Reason for visit-routine follow-up of breast cancer  Heme/Onc history:  Oncology History  Breast cancer of upper-outer quadrant of left female breast ([place])  02/15/2008 Imaging   Small  probable mildly complicated cysts within the upper outer quadrant right breast at the 11 o'clock position in the region of questionable nodularity noted on mammography.  Recommend follow-up right breast diagnostic mammogram and ultrasound in 6 months.      08/13/2008 Imaging   Ultrasound is performed, showing stable small minimal complicated cysts at the right breast 11 o'clock position 4 cm from the nipple, not changed compared prior exam   09/23/2010 Imaging   1.3 cm simple cyst located within the left breast at the 12 o'clock position corresponding to the mammographic finding.  No findings worrisome for malignancy.  Recommend annual screening mammography   09/23/2010 Mammogram   1.3 cm simple cyst located within the left breast at the 12 o'clock position corresponding to the mammographic finding.  No findings worrisome for malignancy.    02/05/2015 Imaging   A 2.5 x 3.5 cm oval circumscribed mass within the upper retroareolar left breast is identified. No suspicious calcifications or distortion identified.   On physical exam, a firm palpable mobile mass is identified at the 11:30 position of the left breast 4 cm from the nipple. Targeted ultrasound is performed, showing a 2.8 x 1.4 x 3.6 cm  simple cyst at the 11:30 position of the left breast, corresponding to the screening study finding.   02/07/2016 Imaging   Targeted ultrasound of the left breast was performed demonstrating an irregular shadowing hypoechoic mass at 1 o'clock 5 cm from nipple measuring approximately 2.3 x 1 x 1.6 cm. This corresponds well with mammography findings. No lymphadenopathy seen in the left axilla.   02/07/2016 Mammogram   Note that the patient has a stable oval circumscribed mass in the retroareolar left breast previously characterized as a cyst. Although this cyst appears stable when compared to 2016 it is overall increasing in size and therefore given the patient's highly suspicious mass in the upper-outer left breast aspiration of the retroareolar left breast cyst is warranted.   02/07/2016 Procedure   She had US guided biopsy of breast mass   02/07/2016 Pathology Results   Accession: [id number]: Biopsy confirmed invasive breast cancer, ER 10% positive, PR 10% positive and Her2/neu negative, Ki 67 15%  Additional molecular study with MammaPrint result came back low risk (10% risk of recurrence in 10 years)   02/27/2016 Imaging   MR breast showed area of known malignancy in the upper-outer quadrant of the left breast, measuring maximum of 4.0 cm (anterior-posterior) an associated clip artifact. No findings to indicate multicentric, multifocal, or contralateral malignancy. No MRI evidence for adenopathy   03/03/2016 Surgery   She underwent left partial mastectomy and sentinel lymph node biopsy   03/03/2016 Pathology Results   CASE: [id number] Invasive lobular carcinoma 2.3 cm, Histologic Grade (Nottingham Histologic Score) Glandular (Acinar)/Tubular Differentiation: Score 3 Nuclear Pleomorphism: Score 2 Mitotic Rate: Score 1 Total score: 6 Overall  Grade: Grade 2 Tumor Size: 23 mm. Size of lymph node invasion is 2.5 mm    Patient completed 5 years of endocrine therapy in November  2023  Interval history-she reports feeling well overall.  She does have a fungal rash under her breasts which is a chronic issue for which she uses nystatin.  ECOG PS- 1 Pain scale- 0  Review of systems- Review of Systems  Constitutional:  Negative for chills, fever, malaise/fatigue and weight loss.  HENT:  Negative for congestion, ear discharge and nosebleeds.   Eyes:  Negative for blurred vision.  Respiratory:  Negative for cough, hemoptysis, sputum production, shortness of breath and wheezing.   Cardiovascular:  Negative for chest pain, palpitations, orthopnea and claudication.  Gastrointestinal:  Negative for abdominal pain, blood in stool, constipation, diarrhea, heartburn, melena, nausea and vomiting.  Genitourinary:  Negative for dysuria, flank pain, frequency, hematuria and urgency.  Musculoskeletal:  Negative for back pain, joint pain and myalgias.  Skin:  Negative for rash.  Neurological:  Negative for dizziness, tingling, focal weakness, seizures, weakness and headaches.  Endo/Heme/Allergies:  Does not bruise/bleed easily.  Psychiatric/Behavioral:  Negative for depression and suicidal ideas. The patient does not have insomnia.       Allergies  Allergen Reactions   Penicillins Anaphylaxis    REACTION: severe anaphylaxis Has patient had a PCN reaction causing immediate rash, facial/tongue/throat swelling, SOB or lightheadedness with hypotension: Yes Has patient had a PCN reaction causing severe rash involving mucus membranes or skin necrosis: No Has patient had a PCN reaction that required hospitalization No Has patient had a PCN reaction occurring within the last 10 years: No If all of the above answers are "NO", then may proceed with Cephalosporin use.    Zithromax [Azithromycin] Rash    z-pack = rash   Anastrozole Other (See Comments)    Extreme sweating   Cefuroxime Axetil Other (See Comments)    unknown   Codeine Other (See Comments)    unknown   Erythromycin  Other (See Comments)    unknown   Nitrofurantoin Other (See Comments)    unknown   Ofloxacin Other (See Comments)    unknown   Prednisone Other (See Comments)   Propoxyphene Other (See Comments)   Tetracycline Other (See Comments)    unknown   Sulfa Antibiotics Other (See Comments) and Rash     Past Medical History:  Diagnosis Date   Anxiety    Asthma    Breast cancer ([place]) 02/07/2016   left breast invasive lobular carcinoma   CAD (coronary artery disease)    mild CAD by cath in 2001   Depression    Diverticulosis    Heart murmur    HTN (hypertension)    Hyperlipidemia    Palpitations    Personal history of radiation therapy 2018   left breast ca   Squamous cell carcinoma of skin 12/09/2017   R lat base of neck, SCCIS   Syncope    Pre-syncope     Past Surgical History:  Procedure Laterality Date   ABDOMINAL HYSTERECTOMY     BREAST BIOPSY Left 02/07/2016   grade II invasive and in situ mammary carcinoma   BREAST CYST ASPIRATION Left 02/13/2016   cyst   BREAST LUMPECTOMY Left 03/03/2016   invasive lobular carcinoma, clear margins, METASTATIC LOBULAR CARCINOMA, 2.5 MM, IN ONE LYMPH NODE (1/1).    CHOLECYSTECTOMY     KNEE ARTHROSCOPY Right    PARTIAL MASTECTOMY WITH NEEDLE LOCALIZATION Left 03/03/2016   Procedure:  PARTIAL MASTECTOMY WITH NEEDLE LOCALIZATION;  Surgeon: Leonie Green, MD;  Location: [facility] ORS;  Service: General;  Laterality: Left;   SENTINEL NODE BIOPSY Left 03/03/2016   Procedure: SENTINEL NODE BIOPSY;  Surgeon: Leonie Green, MD;  Location: [facility] ORS;  Service: General;  Laterality: Left;   TUBAL LIGATION      Social History   Socioeconomic History   Marital status: Married    Spouse name: Not on file   Number of children: Not on file   Years of education: Not on file   Highest education level: Not on file  Occupational History   Not on file  Tobacco Use   Smoking status: Never   Smokeless tobacco: Never  Substance and Sexual  Activity   Alcohol use: No    Alcohol/week: 0.0 standard drinks of alcohol   Drug use: No   Sexual activity: Not on file  Other Topics Concern   Not on file  Social History Narrative   Lives alone with her dog.  Divorced.     No kids    Prev worked assembly work.  Retired at age 53.    Social Determinants of Health   Financial Resource Strain: Low Risk  (12/12/2021)   Overall Financial Resource Strain (CARDIA)    Difficulty of Paying Living Expenses: Not very hard  Food Insecurity: No Food Insecurity (12/12/2021)   Hunger Vital Sign    Worried About Running Out of Food in the Last Year: Never true    Ran Out of Food in the Last Year: Never true  Transportation Needs: No Transportation Needs (12/12/2021)   PRAPARE - Hydrologist (Medical): No    Lack of Transportation (Non-Medical): No  Physical Activity: Insufficiently Active (12/12/2021)   Exercise Vital Sign    Days of Exercise per Week: 3 days    Minutes of Exercise per Session: 30 min  Stress: No Stress Concern Present (12/12/2021)   [place]    Feeling of Stress : Only a little  Social Connections: Moderately Isolated (12/12/2021)   Social Connection and Isolation Panel [NHANES]    Frequency of Communication with Friends and Family: More than three times a week    Frequency of Social Gatherings with Friends and Family: Twice a week    Attends Religious Services: More than 4 times per year    Active Member of [employer] or Organizations: No    Attends Archivist Meetings: Never    Marital Status: Divorced  Human resources officer Violence: Not At Risk (12/12/2021)   Humiliation, Afraid, Rape, and Kick questionnaire    Fear of Current or Ex-Partner: No    Emotionally Abused: No    Physically Abused: No    Sexually Abused: No    Family History  Problem Relation Age of Onset   Cancer Father        oral cancer   Cancer Sister         colon cancer and lymphoma   Colon cancer Sister    Breast cancer Neg Hx      Current Outpatient Medications:    acetaminophen (TYLENOL) 500 MG tablet, Take 1,000 mg by mouth every 6 (six) hours as needed for mild pain., Disp: , Rfl:    albuterol (VENTOLIN HFA) 108 (90 Base) MCG/ACT inhaler, Inhale into the lungs every 6 (six) hours as needed for wheezing or shortness of breath., Disp: , Rfl:    ALPRAZolam Duanne Moron)  0.5 MG tablet, TAKE ONE HALF (1/2) TABLET BY MOUTH IN THE MORNING AND ONE TABLET IN THE EVENING IF NEEDED. SEDATION CAUTION, Disp: 45 tablet, Rfl: 1   amLODipine (NORVASC) 5 MG tablet, Take 0.5-1 tablets (2.5-5 mg total) by mouth daily. (Patient taking differently: Take 5 mg by mouth daily.), Disp: , Rfl:    benzonatate (TESSALON) 200 MG capsule, Take 1 capsule (200 mg total) by mouth 3 (three) times daily as needed for cough., Disp: 15 capsule, Rfl: 0   clarithromycin (BIAXIN) 500 MG tablet, Take 500 mg by mouth daily., Disp: , Rfl:    diclofenac (VOLTAREN) 50 MG EC tablet, TAKE ONE TABLET BY MOUTH ONCE A DAY AS NEEDED WITH FOOD., Disp: 90 tablet, Rfl: 1   escitalopram (LEXAPRO) 20 MG tablet, TAKE 1 TABLET BY MOUTH ONCE A DAY FOR 30DAYS, Disp: 30 tablet, Rfl: 2   fluticasone (FLONASE) 50 MCG/ACT nasal spray, Place into both nostrils., Disp: , Rfl:    hydrochlorothiazide (HYDRODIURIL) 25 MG tablet, TAKE ONE TABLET BY MOUTH ONCE A DAY, Disp: 90 tablet, Rfl: 3   loperamide (IMODIUM A-D) 2 MG tablet, Take 1 tablet (2 mg total) by mouth 4 (four) times daily as needed for diarrhea or loose stools., Disp: , Rfl:    montelukast (SINGULAIR) 10 MG tablet, Take 1 tablet (10 mg total) by mouth at bedtime., Disp: 90 tablet, Rfl: 1   rosuvastatin (CRESTOR) 10 MG tablet, TAKE ONE TABLET BY MOUTH EVERY NIGHT AT BEDTIME, Disp: 90 tablet, Rfl: 1   nystatin powder, APPLY 1 APPLICATION TOPICALLY 3 TIMES DAILY, Disp: 60 g, Rfl: 3  Physical exam:  Vitals:   07/08/22 1028  BP: 114/72  Pulse: 60  Resp:  18  Temp: (!) 97 F (36.1 C)  TempSrc: Tympanic  Weight: 186 lb 14.4 oz (84.8 kg)  Height: 5\' 4"  (1.626 m)   Physical Exam Cardiovascular:     Rate and Rhythm: Normal rate and regular rhythm.     Heart sounds: Normal heart sounds.  Pulmonary:     Effort: Pulmonary effort is normal.     Breath sounds: Normal breath sounds.  Abdominal:     General: Bowel sounds are normal.     Palpations: Abdomen is soft.  Skin:    General: Skin is warm and dry.  Neurological:     Mental Status: She is alert and oriented to person, place, and time.    Breast exam was performed in seated and lying down position. Patient is status post left lumpectomy with a well-healed surgical scar. No evidence of any palpable masses. No evidence of axillary adenopathy. No evidence of any palpable masses or lumps in the right breast. No evidence of right axillary adenopathy.  Mild bilateral inframammary fungal rash noted      Latest Ref Rng & Units 05/14/2022   12:50 PM  CMP  Glucose 70 - 99 mg/dL 113   BUN 6 - 23 mg/dL 15   Creatinine 0.40 - 1.20 mg/dL 0.83   Sodium 135 - 145 mEq/L 129   Potassium 3.5 - 5.1 mEq/L 3.4   Chloride 96 - 112 mEq/L 89   CO2 19 - 32 mEq/L 31   Calcium 8.4 - 10.5 mg/dL 9.1       Latest Ref Rng & Units 05/14/2022   12:50 PM  CBC  WBC 4.0 - 10.5 K/uL 4.6   Hemoglobin 12.0 - 15.0 g/dL 12.5   Hematocrit 36.0 - 46.0 % 36.5   Platelets 150.0 - 400.0 K/uL  207.0      Assessment and plan- Patient is a 77 y.o. female with h/o Stage IIb pT2N1a(sn)cM0 invasive lobular carcinoma of the left breast ER 10% positive, PR 10% positive, HER-2/neu negative status post lumpectomy/SLND.  She is s/p 5 years of endocrine therapy and is here for routine follow-up  Clinically patient is doing well with no concerning signs and symptoms of recurrence based on today's exam.  She had a mammogram in December 2023 which was unremarkable.  Since she is now more than 5 years following diagnosis of breast cancer  I will see her back in 1 year with labs.  I will also schedule her mammogram for December 2024.  Dermatitis involving the inframammary region: Likely fungal in etiology and nystatin has been prescribed   Visit Diagnosis 1. Encounter for follow-up surveillance of breast cancer      Dr. Randa Evens, MD, MPH [facility] [id number] 07/08/2022 4:41 PM

## 2022-07-09 ENCOUNTER — Ambulatory Visit: Payer: Medicare HMO

## 2022-07-09 DIAGNOSIS — G8929 Other chronic pain: Secondary | ICD-10-CM

## 2022-07-09 DIAGNOSIS — M6281 Muscle weakness (generalized): Secondary | ICD-10-CM

## 2022-07-09 DIAGNOSIS — M25562 Pain in left knee: Secondary | ICD-10-CM | POA: Diagnosis not present

## 2022-07-09 DIAGNOSIS — M25561 Pain in right knee: Secondary | ICD-10-CM | POA: Diagnosis not present

## 2022-07-09 NOTE — Therapy (Signed)
OUTPATIENT PHYSICAL THERAPY TREATMENT NOTE And Progress Report (06/03/2022 - 07/09/2022)   Patient Name: Jamie Curry MRN: [id number] DOB:1945-09-22, 77 y.o., female Today's Date: 07/09/2022  PCP: Tonia Ghent, MD  REFERRING PROVIDER: Mcarthur Rossetti, MD   END OF SESSION:  PT End of Session - 07/09/22 1105     Visit Number 10    Number of Visits 17    Date for PT Re-Evaluation 07/23/22    PT Start Time 1105    PT Stop Time 1147    PT Time Calculation (min) 42 min    Equipment Utilized During Treatment Gait belt    Activity Tolerance Patient tolerated treatment well    Behavior During Therapy WFL for tasks assessed/performed                    Past Medical History:  Diagnosis Date   Anxiety    Asthma    Breast cancer ([place]) 02/07/2016   left breast invasive lobular carcinoma   CAD (coronary artery disease)    mild CAD by cath in 2001   Depression    Diverticulosis    Heart murmur    HTN (hypertension)    Hyperlipidemia    Palpitations    Personal history of radiation therapy 2018   left breast ca   Squamous cell carcinoma of skin 12/09/2017   R lat base of neck, SCCIS   Syncope    Pre-syncope   Past Surgical History:  Procedure Laterality Date   ABDOMINAL HYSTERECTOMY     BREAST BIOPSY Left 02/07/2016   grade II invasive and in situ mammary carcinoma   BREAST CYST ASPIRATION Left 02/13/2016   cyst   BREAST LUMPECTOMY Left 03/03/2016   invasive lobular carcinoma, clear margins, METASTATIC LOBULAR CARCINOMA, 2.5 MM, IN ONE LYMPH NODE (1/1).    CHOLECYSTECTOMY     KNEE ARTHROSCOPY Right    PARTIAL MASTECTOMY WITH NEEDLE LOCALIZATION Left 03/03/2016   Procedure: PARTIAL MASTECTOMY WITH NEEDLE LOCALIZATION;  Surgeon: Leonie Green, MD;  Location: [facility];  Service: General;  Laterality: Left;   SENTINEL NODE BIOPSY Left 03/03/2016   Procedure: SENTINEL NODE BIOPSY;  Surgeon: Leonie Green, MD;  Location: [facility];  Service:  General;  Laterality: Left;   TUBAL LIGATION     Patient Active Problem List   Diagnosis Date Noted   COVID-19 05/14/2022   Edema 03/01/2022   Advance care planning 09/03/2021   Left shoulder pain 09/03/2021   Asthma 09/03/2021   Anxiety 09/03/2021   Unilateral primary osteoarthritis, left knee 08/19/2016   Unilateral primary osteoarthritis, right knee 08/19/2016   Hot flashes 03/25/2016   Breast cancer of upper-outer quadrant of left female breast ([place]) 02/14/2016   Hyperlipidemia 11/12/2011   HTN (hypertension) 12/29/2010   Palpitations 12/29/2010    REFERRING DIAG: [id number] (ICD-10-CM) - Chronic pain of left knee M25.561,G89.29 (ICD-10-CM) - Chronic pain of right knee   THERAPY DIAG:  Muscle weakness (generalized) - Plan: PT plan of care cert/re-cert  Chronic pain of left knee - Plan: PT plan of care cert/re-cert  Chronic pain of right knee - Plan: PT plan of care cert/re-cert  Rationale for Evaluation and Treatment Rehabilitation  PERTINENT HISTORY: Chronic B knee pain. [facility] keep bothering her. Walked on the treadmil and did the bicycle which bothers her knee. Had a gel shot in both knees about 3 years ago which helped for a couple years. B  knees are not hurting but R knee feels like it buckles once in a while. Used to walk her dog which died about 5 months ago. Doctor wants her to do PT for strength and balance. R knee bothers her more than her L . The diclofenac helps which she has been taking about a year.   PRECAUTIONS: possible fall risk   SUBJECTIVE:   SUBJECTIVE STATEMENT: Knees are doing alright. No pain currently. Feels like she is doing good progressing towards her goals.      PAIN:  Are you having pain?     No latex allergies Blood pressure is controlled per pt.     TODAY'S TREATMENT:                                                                                                                                          DATE: 07/09/2022    Therapeutic exercise  Seated manually resisted hip extension, S/L hip abduction   Stand <> sit <> floor transfer 3x, CGA, cues for proper technique   Reviewed progress/current status with PT towards goals  Side stepping, red band around knees 20 ft to the R and 20 ft to the L  Standing with B UE assist, red band around knees  Hip abduction    R 10x   L 10x    Then with red band around ankles    Hip abduction     R 10x2    L 10x2  Bent over onto mat table  Hip extension    R 10x3   L 10x3  Static standing mini lunge with contralateral UE assist   L 10x2  R 10x2    Improved exercise technique, movement at target joints, use of target muscles after mod verbal, visual, tactile cues.       Response to treatment Pt tolerated session well without aggravation of symptoms.      Clinical impression Pt demonstrates Improved balance, abilty to perform stand <> sit <> floor transfers without knee pain and improved hip strength since initial evaluation. Pt making good progress with PT towards goals. Continued working on improving glute and quadriceps strength to continue to improve balance and transfers. Pt tolerated session well without aggravation of symptoms. Pt will benefit from continued skilled physical therapy services to decrease pain, improve strength, balance, and function.      PATIENT EDUCATION: Education details: there-ex, HEP Person educated: Patient Education method: Explanation, Demonstration, Tactile cues, Verbal cues, and Handouts Education comprehension: verbalized understanding and returned demonstration  HOME EXERCISE PROGRAM: Access Code: [email or web address] URL: [email or web address] Date: 06/08/2022 Prepared by: Joneen Boers  Exercises - Seated Scapular Retraction  - 3 x daily - 7 x weekly - 3 sets - 10 reps - 5 seconds hold - Standing Partial Lunge  - 1 x daily - 7 x weekly - 2 sets - 10 reps    Yellow  band - Supine Shoulder Horizontal Abduction with Dumbbells  - 1 x daily - 7 x weekly - 3 sets - 10 reps - 5 seconds hold - Hooklying Clamshell with Resistance  - 1 x daily - 7 x weekly - 3 sets - 10 reps  (Green band)  - Supine Hip Adduction Isometric with Ball  - 1 x daily - 7 x weekly - 3 sets - 10 reps - 5 seconds hold - Supine Posterior Pelvic Tilt  - 1 x daily - 7 x weekly - 3 sets - 10 reps - 5 seconds hold  - Standing Hip Abduction with Resistance at Ankles and Counter Support  - 1 x daily - 7 x weekly - 2 sets - 10 reps    PT Short Term Goals - 07/01/22 1036       PT SHORT TERM GOAL #1   Title Pt will be independent with her initial HEP to improve strength, balance, function, and ability to squat and perform stand <> floor transfers with less difficulty.    Baseline Doing her HEP, no questions (07/01/2022)    Time 3    Period Weeks    Status Achieved    Target Date 06/25/22              PT Long Term Goals - 07/09/22 1106       PT LONG TERM GOAL #1   Title Pt will be able to perform a stand to floor to stand transfer, mod I at least 3x with minimal to no B  knee pain to promote ability to get up from the floor at home with less difficulty.   Mod A  corrected to mod I for LTG on 07/09/2022)   Baseline Difficulty performing floor to stand transfers with knee symptoms (06/03/2022); able to perform one transfers with use of chair, SBA during first repetition, LOB wiht Min PT assist during 2nd repetition (07/01/2022); able to perform 3x with CGA, cues for proper technique, no knee pain reported (07/09/2022)    Time 2    Period Weeks    Status Partially Met    Target Date 07/23/22      PT LONG TERM GOAL #2   Title Pt will improver her DGI score by at least 4 points as a demonstration of improved balance.    Baseline DGI score 19 (06/03/2022); 21 (07/01/2022)    Time 2    Period Weeks    Status Partially Met    Target Date 07/23/22      PT LONG TERM GOAL #3   Title Pt will  improve bilateral hip extension and abduction strength to promote ability to ambulate, perform standing tasks, negotiate stairs with less difficulty.    Baseline Seated manually resisted hip extension 3+/5 R, 4-/5 L, S/L hip abduction 4/5 R and L (06/03/2022); seated manually resisted hip extension 4/5 R, 4/5 L, S/L hip abduction 4+/5 R, 4+/5 L (07/09/2022)    Time 6    Period Weeks    Status Achieved    Target Date 07/16/22              Plan - 07/09/22 1104     Clinical Impression Statement Pt demonstrates Improved balance, abilty to perform stand <> sit <> floor transfers without knee pain and improved hip strength since initial evaluation. Pt making good progress with PT towards goals. Continued working on improving glute and quadriceps strength to continue to improve balance and transfers. Pt tolerated session well without  aggravation of symptoms. Pt will benefit from continued skilled physical therapy services to decrease pain, improve strength, balance, and function.    Personal Factors and Comorbidities Comorbidity 3+;Age;Fitness    Comorbidities Anxiety, hx of breast CA, depression, HTN, syncope    Examination-Activity Limitations Squat;Other   walking while looking around   Stability/Clinical Decision Making Stable/Uncomplicated    Rehab Potential Fair    PT Frequency 2x / week    PT Duration 6 weeks    PT Treatment/Interventions Therapeutic activities;Therapeutic exercise;Balance training;Neuromuscular re-education;Patient/family education;Manual techniques;Dry needling;Vestibular;Canalith Repostioning;Electrical Stimulation;Iontophoresis 4mg /ml Dexamethasone;Gait training;Stair training    PT Next Visit Plan scapular strengthening, thoracic extension, trunk and hip strength, balance, manual techniques, modalities PRN    PT Home Exercise Plan Medbridge Access Code: [email or web address]    Consulted and Agree with Plan of Care Patient              Thank you for your  referral.   Joneen Boers PT, DPT  07/09/2022, 1:15 PM

## 2022-07-13 ENCOUNTER — Ambulatory Visit: Payer: Medicare HMO | Attending: Orthopaedic Surgery

## 2022-07-13 DIAGNOSIS — M25562 Pain in left knee: Secondary | ICD-10-CM | POA: Insufficient documentation

## 2022-07-13 DIAGNOSIS — M25561 Pain in right knee: Secondary | ICD-10-CM | POA: Diagnosis not present

## 2022-07-13 DIAGNOSIS — M6281 Muscle weakness (generalized): Secondary | ICD-10-CM | POA: Diagnosis not present

## 2022-07-13 DIAGNOSIS — G8929 Other chronic pain: Secondary | ICD-10-CM | POA: Insufficient documentation

## 2022-07-13 NOTE — Therapy (Signed)
OUTPATIENT PHYSICAL THERAPY TREATMENT NOTE    Patient Name: Jamie Curry MRN: [id number] DOB:05-Jul-1945, 77 y.o., female Today's Date: 07/13/2022  PCP: Tonia Ghent, MD  REFERRING PROVIDER: Mcarthur Rossetti, MD   END OF SESSION:  PT End of Session - 07/13/22 1104     Visit Number 11    Number of Visits 17    Date for PT Re-Evaluation 07/23/22    PT Start Time 1104    PT Stop Time 1143    PT Time Calculation (min) 39 min    Equipment Utilized During Treatment --    Activity Tolerance Patient tolerated treatment well    Behavior During Therapy [facility] for tasks assessed/performed                     Past Medical History:  Diagnosis Date   Anxiety    Asthma    Breast cancer ([place]) 02/07/2016   left breast invasive lobular carcinoma   CAD (coronary artery disease)    mild CAD by cath in 2001   Depression    Diverticulosis    Heart murmur    HTN (hypertension)    Hyperlipidemia    Palpitations    Personal history of radiation therapy 2018   left breast ca   Squamous cell carcinoma of skin 12/09/2017   R lat base of neck, SCCIS   Syncope    Pre-syncope   Past Surgical History:  Procedure Laterality Date   ABDOMINAL HYSTERECTOMY     BREAST BIOPSY Left 02/07/2016   grade II invasive and in situ mammary carcinoma   BREAST CYST ASPIRATION Left 02/13/2016   cyst   BREAST LUMPECTOMY Left 03/03/2016   invasive lobular carcinoma, clear margins, METASTATIC LOBULAR CARCINOMA, 2.5 MM, IN ONE LYMPH NODE (1/1).    CHOLECYSTECTOMY     KNEE ARTHROSCOPY Right    PARTIAL MASTECTOMY WITH NEEDLE LOCALIZATION Left 03/03/2016   Procedure: PARTIAL MASTECTOMY WITH NEEDLE LOCALIZATION;  Surgeon: Leonie Green, MD;  Location: [facility];  Service: General;  Laterality: Left;   SENTINEL NODE BIOPSY Left 03/03/2016   Procedure: SENTINEL NODE BIOPSY;  Surgeon: Leonie Green, MD;  Location: [facility];  Service: General;  Laterality: Left;   TUBAL LIGATION      Patient Active Problem List   Diagnosis Date Noted   COVID-19 05/14/2022   Edema 03/01/2022   Advance care planning 09/03/2021   Left shoulder pain 09/03/2021   Asthma 09/03/2021   Anxiety 09/03/2021   Unilateral primary osteoarthritis, left knee 08/19/2016   Unilateral primary osteoarthritis, right knee 08/19/2016   Hot flashes 03/25/2016   Breast cancer of upper-outer quadrant of left female breast 02/14/2016   Hyperlipidemia 11/12/2011   HTN (hypertension) 12/29/2010   Palpitations 12/29/2010    REFERRING DIAG: [id number] (ICD-10-CM) - Chronic pain of left knee M25.561,G89.29 (ICD-10-CM) - Chronic pain of right knee   THERAPY DIAG:  Muscle weakness (generalized)  Chronic pain of left knee  Chronic pain of right knee  Rationale for Evaluation and Treatment Rehabilitation  PERTINENT HISTORY: Chronic B knee pain. [facility] but her knees keep bothering her. Walked on the treadmil and did the bicycle which bothers her knee. Had a gel shot in both knees about 3 years ago which helped for a couple years. B knees are not hurting but R knee feels like it buckles once in a while. Used to walk her dog which died about 5 months ago. Doctor  wants her to do PT for strength and balance. R knee bothers her more than her L . The diclofenac helps which she has been taking about a year.   PRECAUTIONS: possible fall risk   SUBJECTIVE:   SUBJECTIVE STATEMENT: No knee pain currently.     PAIN:  Are you having pain?     No latex allergies Blood pressure is controlled per pt.     TODAY'S TREATMENT:                                                                                                                                         DATE: 07/13/2022    Therapeutic exercise   Single leg dead lift with contralateral UE assist   R 10x2  L 10x2  Static standing lunge with contralateral UE assist   L 10x3  R 10x3  Side stepping, red band around knees  32 ft to the R and 32 ft to the L for 2 sets  Standing B shoulder extension with scapular retraction, red band 10x3  To promote thoracic extension to decrease difficulty walking and looking up.  Bent over onto mat table  Hip extension    R 10x3   L 10x3  Standing with B UE assist, red band around knees  Hip abduction with red band around ankles    R 10x3    L 10x3  Sit <> stand, no UE assist 7x, then 5x without UE assist  Knee discomfort.       Improved exercise technique, movement at target joints, use of target muscles after mod verbal, visual, tactile cues.       Response to treatment Pt tolerated session well without aggravation of symptoms.      Clinical impression Continued working on improving glute and quadriceps strength to continue to improve balance and transfers. Pt tolerated session well without aggravation of symptoms. Pt will benefit from continued skilled physical therapy services to decrease pain, improve strength, balance, and function.      PATIENT EDUCATION: Education details: there-ex, HEP Person educated: Patient Education method: Explanation, Demonstration, Tactile cues, Verbal cues, and Handouts Education comprehension: verbalized understanding and returned demonstration  HOME EXERCISE PROGRAM: Access Code: [email or web address] URL: [email or web address] Date: 06/08/2022 Prepared by: Joneen Boers  Exercises - Seated Scapular Retraction  - 3 x daily - 7 x weekly - 3 sets - 10 reps - 5 seconds hold - Standing Partial Lunge  - 1 x daily - 7 x weekly - 2 sets - 10 reps    Yellow band - Supine Shoulder Horizontal Abduction with Dumbbells  - 1 x daily - 7 x weekly - 3 sets - 10 reps - 5 seconds hold - Hooklying Clamshell with Resistance  - 1 x daily - 7 x weekly - 3 sets - 10 reps  (Green band)  - Supine Hip Adduction Isometric with Ball  - 1 x  daily - 7 x weekly - 3 sets - 10 reps - 5 seconds hold - Supine Posterior Pelvic Tilt  - 1 x  daily - 7 x weekly - 3 sets - 10 reps - 5 seconds hold  - Standing Hip Abduction with Resistance at Ankles and Counter Support  - 1 x daily - 7 x weekly - 2 sets - 10 reps    PT Short Term Goals - 07/01/22 1036       PT SHORT TERM GOAL #1   Title Pt will be independent with her initial HEP to improve strength, balance, function, and ability to squat and perform stand <> floor transfers with less difficulty.    Baseline Doing her HEP, no questions (07/01/2022)    Time 3    Period Weeks    Status Achieved    Target Date 06/25/22              PT Long Term Goals - 07/09/22 1106       PT LONG TERM GOAL #1   Title Pt will be able to perform a stand to floor to stand transfer, mod I at least 3x with minimal to no B  knee pain to promote ability to get up from the floor at home with less difficulty.   Mod A  corrected to mod I for LTG on 07/09/2022)   Baseline Difficulty performing floor to stand transfers with knee symptoms (06/03/2022); able to perform one transfers with use of chair, SBA during first repetition, LOB wiht Min PT assist during 2nd repetition (07/01/2022); able to perform 3x with CGA, cues for proper technique, no knee pain reported (07/09/2022)    Time 2    Period Weeks    Status Partially Met    Target Date 07/23/22      PT LONG TERM GOAL #2   Title Pt will improver her DGI score by at least 4 points as a demonstration of improved balance.    Baseline DGI score 19 (06/03/2022); 21 (07/01/2022)    Time 2    Period Weeks    Status Partially Met    Target Date 07/23/22      PT LONG TERM GOAL #3   Title Pt will improve bilateral hip extension and abduction strength to promote ability to ambulate, perform standing tasks, negotiate stairs with less difficulty.    Baseline Seated manually resisted hip extension 3+/5 R, 4-/5 L, S/L hip abduction 4/5 R and L (06/03/2022); seated manually resisted hip extension 4/5 R, 4/5 L, S/L hip abduction 4+/5 R, 4+/5 L (07/09/2022)    Time 6     Period Weeks    Status Achieved    Target Date 07/16/22              Plan - 07/13/22 1101     Clinical Impression Statement Continued working on improving glute and quadriceps strength to continue to improve balance and transfers. Pt tolerated session well without aggravation of symptoms. Pt will benefit from continued skilled physical therapy services to decrease pain, improve strength, balance, and function.    Personal Factors and Comorbidities Comorbidity 3+;Age;Fitness    Comorbidities Anxiety, hx of breast CA, depression, HTN, syncope    Examination-Activity Limitations Squat;Other   walking while looking around   Stability/Clinical Decision Making Stable/Uncomplicated    Rehab Potential Fair    PT Frequency 2x / week    PT Duration 6 weeks    PT Treatment/Interventions Therapeutic activities;Therapeutic exercise;Balance training;Neuromuscular re-education;Patient/family education;Manual techniques;Dry  needling;Vestibular;Canalith Repostioning;Electrical Stimulation;Iontophoresis 4mg /ml Dexamethasone;Gait training;Stair training    PT Next Visit Plan scapular strengthening, thoracic extension, trunk and hip strength, balance, manual techniques, modalities PRN    PT Home Exercise Plan Medbridge Access Code: [email or web address]    Consulted and Agree with Plan of Care Patient               Joneen Boers PT, DPT  07/13/2022, 1:33 PM

## 2022-07-15 ENCOUNTER — Ambulatory Visit: Payer: Medicare HMO

## 2022-07-20 ENCOUNTER — Ambulatory Visit: Payer: Medicare HMO

## 2022-07-20 DIAGNOSIS — M6281 Muscle weakness (generalized): Secondary | ICD-10-CM | POA: Diagnosis not present

## 2022-07-20 DIAGNOSIS — M25562 Pain in left knee: Secondary | ICD-10-CM | POA: Diagnosis not present

## 2022-07-20 DIAGNOSIS — G8929 Other chronic pain: Secondary | ICD-10-CM

## 2022-07-20 DIAGNOSIS — M25561 Pain in right knee: Secondary | ICD-10-CM | POA: Diagnosis not present

## 2022-07-20 NOTE — Therapy (Signed)
OUTPATIENT PHYSICAL THERAPY TREATMENT NOTE    Patient Name: Jamie Curry MRN: [id number] DOB:August 06, 1945, 77 y.o., female Today's Date: 07/20/2022  PCP: Joaquim Namuncan, Graham S, MD  REFERRING PROVIDER: Kathryne HitchBlackman, Christopher Y, MD   END OF SESSION:  PT End of Session - 07/20/22 1104     Visit Number 12    Number of Visits 17    Date for PT Re-Evaluation 07/23/22    PT Start Time 1105    PT Stop Time 1133    PT Time Calculation (min) 28 min    Activity Tolerance Patient tolerated treatment well    Behavior During Therapy [facility] for tasks assessed/performed                      Past Medical History:  Diagnosis Date   Anxiety    Asthma    Breast cancer (HCC) 02/07/2016   left breast invasive lobular carcinoma   CAD (coronary artery disease)    mild CAD by cath in 2001   Depression    Diverticulosis    Heart murmur    HTN (hypertension)    Hyperlipidemia    Palpitations    Personal history of radiation therapy 2018   left breast ca   Squamous cell carcinoma of skin 12/09/2017   R lat base of neck, SCCIS   Syncope    Pre-syncope   Past Surgical History:  Procedure Laterality Date   ABDOMINAL HYSTERECTOMY     BREAST BIOPSY Left 02/07/2016   grade II invasive and in situ mammary carcinoma   BREAST CYST ASPIRATION Left 02/13/2016   cyst   BREAST LUMPECTOMY Left 03/03/2016   invasive lobular carcinoma, clear margins, METASTATIC LOBULAR CARCINOMA, 2.5 MM, IN ONE LYMPH NODE (1/1).    CHOLECYSTECTOMY     KNEE ARTHROSCOPY Right    PARTIAL MASTECTOMY WITH NEEDLE LOCALIZATION Left 03/03/2016   Procedure: PARTIAL MASTECTOMY WITH NEEDLE LOCALIZATION;  Surgeon: Nadeen LandauJarvis Wilton Smith, MD;  Location: [facility] ORS;  Service: General;  Laterality: Left;   SENTINEL NODE BIOPSY Left 03/03/2016   Procedure: SENTINEL NODE BIOPSY;  Surgeon: Nadeen LandauJarvis Wilton Smith, MD;  Location: [facility] ORS;  Service: General;  Laterality: Left;   TUBAL LIGATION     Patient Active Problem List    Diagnosis Date Noted   COVID-19 05/14/2022   Edema 03/01/2022   Advance care planning 09/03/2021   Left shoulder pain 09/03/2021   Asthma 09/03/2021   Anxiety 09/03/2021   Unilateral primary osteoarthritis, left knee 08/19/2016   Unilateral primary osteoarthritis, right knee 08/19/2016   Hot flashes 03/25/2016   Breast cancer of upper-outer quadrant of left female breast 02/14/2016   Hyperlipidemia 11/12/2011   HTN (hypertension) 12/29/2010   Palpitations 12/29/2010    REFERRING DIAG: W09.811,B14.7825.562,G89.29 (ICD-10-CM) - Chronic pain of left knee M25.561,G89.29 (ICD-10-CM) - Chronic pain of right knee   THERAPY DIAG:  Muscle weakness (generalized)  Chronic pain of left knee  Chronic pain of right knee  Rationale for Evaluation and Treatment Rehabilitation  PERTINENT HISTORY: Chronic B knee pain. [facility] keep bothering her. Walked on the treadmil and did the bicycle which bothers her knee. Had a gel shot in both knees about 3 years ago which helped for a couple years. B knees are not hurting but R knee feels like it buckles once in a while. Used to walk her dog which died about 5 months ago. Doctor wants her to do PT for strength  and balance. R knee bothers her more than her L . The diclofenac helps which she has been taking about a year.   PRECAUTIONS: possible fall risk   SUBJECTIVE:   SUBJECTIVE STATEMENT: A few days ago, pt felt pulling/muscle soreness in her abdomen and B anterior thighs (quadriceps). Feels better now. No knee pain currently. Balance is better than what it was.     PAIN:  Are you having pain?     No latex allergies Blood pressure is controlled per pt.     TODAY'S TREATMENT:                                                                                                                                         DATE: 07/20/2022    Therapeutic exercise  Bent over onto mat table  Hip extension    R 10x3   L  10x3  Standing with B UE assist, red band around knees  Hip abduction with red band around ankles    R 10x3    L 10x3  With red band around knees (with towel padding)  Sit <> stand with emphasis on femoral control 5x3 without UE assist to promote quadriceps strength  No knee pain reported  Side stepping, red band around knees 32 ft to the R and 32 ft to the L for 2 sets  Standing B shoulder extension with scapular retraction, red band 10x, then 10x2 with 5 second holds   To promote thoracic extension to decrease difficulty walking and looking up.    Improved exercise technique, movement at target joints, use of target muscles after mod verbal, visual, tactile cues.       Response to treatment Pt tolerated session well without aggravation of symptoms.      Clinical impression   Continued working on improving glute and quadriceps strength to continue to improve balance and transfers. Continued working on improving thoracic extension to decrease difficulty looking up while walking. Pt tolerated session well without aggravation of symptoms. Pt will benefit from continued skilled physical therapy services to decrease pain, improve strength, balance, and function.      PATIENT EDUCATION: Education details: there-ex, HEP Person educated: Patient Education method: Explanation, Demonstration, Tactile cues, Verbal cues, and Handouts Education comprehension: verbalized understanding and returned demonstration  HOME EXERCISE PROGRAM: Access Code: [email or web address] URL: [email or web address] Date: 06/08/2022 Prepared by: Loralyn Freshwater  Exercises - Seated Scapular Retraction  - 3 x daily - 7 x weekly - 3 sets - 10 reps - 5 seconds hold - Standing Partial Lunge  - 1 x daily - 7 x weekly - 2 sets - 10 reps    Yellow band - Supine Shoulder Horizontal Abduction with Dumbbells  - 1 x daily - 7 x weekly - 3 sets - 10 reps - 5 seconds hold - Hooklying Clamshell with Resistance  -  1 x daily - 7 x  weekly - 3 sets - 10 reps  (Green band)  - Supine Hip Adduction Isometric with Ball  - 1 x daily - 7 x weekly - 3 sets - 10 reps - 5 seconds hold - Supine Posterior Pelvic Tilt  - 1 x daily - 7 x weekly - 3 sets - 10 reps - 5 seconds hold  - Standing Hip Abduction with Resistance at Ankles and Counter Support  - 1 x daily - 7 x weekly - 2 sets - 10 reps       PT Short Term Goals - 07/01/22 1036       PT SHORT TERM GOAL #1   Title Pt will be independent with her initial HEP to improve strength, balance, function, and ability to squat and perform stand <> floor transfers with less difficulty.    Baseline Doing her HEP, no questions (07/01/2022)    Time 3    Period Weeks    Status Achieved    Target Date 06/25/22              PT Long Term Goals - 07/09/22 1106       PT LONG TERM GOAL #1   Title Pt will be able to perform a stand to floor to stand transfer, mod I at least 3x with minimal to no B  knee pain to promote ability to get up from the floor at home with less difficulty.   Mod A  corrected to mod I for LTG on 07/09/2022)   Baseline Difficulty performing floor to stand transfers with knee symptoms (06/03/2022); able to perform one transfers with use of chair, SBA during first repetition, LOB wiht Min PT assist during 2nd repetition (07/01/2022); able to perform 3x with CGA, cues for proper technique, no knee pain reported (07/09/2022)    Time 2    Period Weeks    Status Partially Met    Target Date 07/23/22      PT LONG TERM GOAL #2   Title Pt will improver her DGI score by at least 4 points as a demonstration of improved balance.    Baseline DGI score 19 (06/03/2022); 21 (07/01/2022)    Time 2    Period Weeks    Status Partially Met    Target Date 07/23/22      PT LONG TERM GOAL #3   Title Pt will improve bilateral hip extension and abduction strength to promote ability to ambulate, perform standing tasks, negotiate stairs with less difficulty.     Baseline Seated manually resisted hip extension 3+/5 R, 4-/5 L, S/L hip abduction 4/5 R and L (06/03/2022); seated manually resisted hip extension 4/5 R, 4/5 L, S/L hip abduction 4+/5 R, 4+/5 L (07/09/2022)    Time 6    Period Weeks    Status Achieved    Target Date 07/16/22              Plan - 07/20/22 1104     Clinical Impression Statement Continued working on improving glute and quadriceps strength to continue to improve balance and transfers. Continued working on improving thoracic extension to decrease difficulty looking up while walking. Pt tolerated session well without aggravation of symptoms. Pt will benefit from continued skilled physical therapy services to decrease pain, improve strength, balance, and function.    Personal Factors and Comorbidities Comorbidity 3+;Age;Fitness    Comorbidities Anxiety, hx of breast CA, depression, HTN, syncope    Examination-Activity Limitations Squat;Other   walking while looking around  Stability/Clinical Decision Making Stable/Uncomplicated    Rehab Potential Fair    PT Frequency 2x / week    PT Duration 6 weeks    PT Treatment/Interventions Therapeutic activities;Therapeutic exercise;Balance training;Neuromuscular re-education;Patient/family education;Manual techniques;Dry needling;Vestibular;Canalith Repostioning;Electrical Stimulation;Iontophoresis 4mg /ml Dexamethasone;Gait training;Stair training    PT Next Visit Plan scapular strengthening, thoracic extension, trunk and hip strength, balance, manual techniques, modalities PRN    PT Home Exercise Plan Medbridge Access Code: [email or web address]    Consulted and Agree with Plan of Care Patient               Loralyn Freshwater PT, DPT  07/20/2022, 12:39 PM

## 2022-07-22 ENCOUNTER — Ambulatory Visit: Payer: Medicare HMO

## 2022-07-23 ENCOUNTER — Encounter: Payer: Self-pay | Admitting: Family Medicine

## 2022-07-23 ENCOUNTER — Ambulatory Visit (INDEPENDENT_AMBULATORY_CARE_PROVIDER_SITE_OTHER): Payer: Medicare HMO | Admitting: Family Medicine

## 2022-07-23 ENCOUNTER — Telehealth: Payer: Self-pay | Admitting: Family Medicine

## 2022-07-23 VITALS — BP 142/58 | HR 64 | Temp 98.3°F | Ht 64.0 in | Wt 186.0 lb

## 2022-07-23 DIAGNOSIS — F419 Anxiety disorder, unspecified: Secondary | ICD-10-CM

## 2022-07-23 MED ORDER — AMLODIPINE BESYLATE 5 MG PO TABS: 5.0000 mg | ORAL_TABLET | Freq: Every day | ORAL | Status: DC

## 2022-07-23 MED ORDER — MELATONIN 5 MG PO TABS: 5.0000 mg | ORAL_TABLET | Freq: Every evening | ORAL | Status: AC | PRN

## 2022-07-23 NOTE — Telephone Encounter (Signed)
Please check with patient. Please ask her to call [phone] to get scheduled with Doree Barthel with counseling.  Thanks.

## 2022-07-23 NOTE — Patient Instructions (Signed)
Let me check on counseling options.  Try taking melatonin 5mg  at night for sleep.  Take care.  Glad to see you.

## 2022-07-23 NOTE — Progress Notes (Signed)
Taking amlodipine 5mg  daily with minimal BLE edema.    Taking lexapro 20mg  a day.  Taking  xanax BID.  She is having more anxiety in the evening but not everyday.  She is still grieving the loss of her dog, d/w pt.  She is living alone.  No SI/HI.  Insomnia noted most nights. She had noted obsessive compulsive tendency, to go back and check to make sure water is off, stove is off.  This isn't a new issue.  She had checked her car door so many times she wore out the door handle.   Meds, vitals, and allergies reviewed.   ROS: Per HPI unless specifically indicated in ROS section   Nad Ncat Neck supple, no LA Rrr Ctab Abd soft, not ttp Speech and judgment intact. No tremor.

## 2022-07-23 NOTE — Telephone Encounter (Signed)
LMTCB

## 2022-07-24 NOTE — Telephone Encounter (Signed)
Patient returned call, gave the number for scheduling.

## 2022-07-26 NOTE — Assessment & Plan Note (Signed)
With OCD tendencies.  Discussed continuing alprazolam and Lexapro and getting her set up with counseling here in clinic.  I will check with staff about getting that set up.  She had trouble getting it arranged before, because the intake forms were overwhelming for her.  Discussed trying melatonin for sleep as needed.  No suicidal or homicidal intent.  Still okay for outpatient follow-up.

## 2022-07-28 ENCOUNTER — Telehealth: Payer: Self-pay | Admitting: *Deleted

## 2022-07-28 ENCOUNTER — Encounter: Payer: Self-pay | Admitting: *Deleted

## 2022-07-28 DIAGNOSIS — B49 Unspecified mycosis: Secondary | ICD-10-CM | POA: Insufficient documentation

## 2022-07-28 NOTE — Telephone Encounter (Signed)
Went through cover my meds to get auth for nystatin powder. Waiting for the answer- will check back on 4/17

## 2022-08-24 ENCOUNTER — Other Ambulatory Visit: Payer: Self-pay | Admitting: Family Medicine

## 2022-08-24 DIAGNOSIS — Z170 Estrogen receptor positive status [ER+]: Secondary | ICD-10-CM

## 2022-08-24 NOTE — Telephone Encounter (Signed)
LAST APPOINTMENT DATE: 07/23/2022 anxiety    NEXT APPOINTMENT DATE: n/a     LAST REFILL: 06/24/22   QTY: #45 1 rf  No UDS or contract on file

## 2022-08-31 ENCOUNTER — Other Ambulatory Visit: Payer: Self-pay | Admitting: Family Medicine

## 2022-09-08 ENCOUNTER — Ambulatory Visit (INDEPENDENT_AMBULATORY_CARE_PROVIDER_SITE_OTHER): Payer: Medicare HMO | Admitting: Clinical

## 2022-09-08 DIAGNOSIS — F4323 Adjustment disorder with mixed anxiety and depressed mood: Secondary | ICD-10-CM

## 2022-09-08 NOTE — Progress Notes (Signed)
[facility] Counselor Initial Adult Exam  Name: Jamie Curry Date: 09/08/2022 MRN: [id number] DOB: 1945/04/18 PCP: Joaquim Nam, MD  Time spent: 10:30am - 11:28am   Guardian/Payee:  NA    Paperwork requested:  NA  Reason for Visit /Presenting Problem: Patient reported she and her husband separated in 2017 after 17 years of marriage. Patient reported she moved into her apartment in November 2017 to be close to her brother and to help her brother. Patient reported she had a mammogram the day she moved into her apartment and was diagnosed with breast cancer as a result. Patient reported her brother passed away in 01/07/18and her sister passed away in Jun 17, 2016. Patient reported after her brother and sister passed away she felt all she had left was her dog, Sammy. Patient reported her dog was then diagnosed with cancer and patient reported she had to make the decision to put her dog to sleep. Patient reported experiencing loneliness since her dog passed away and reported feelings of guilt.  Patient stated, "I'm easy to upset".  Mental Status Exam: Appearance:   Well Groomed     Behavior:  Appropriate  Motor:  Normal  Speech/Language:   Clear and Coherent  Affect:  Appropriate  Mood:  anxious  Thought process:  normal  Thought content:    WNL  Sensory/Perceptual disturbances:    WNL  Orientation:  oriented to person, place, situation, and day of week  Attention:  Good  Concentration:  Good  Memory:  WNL  Fund of knowledge:   Good  Insight:    Fair  Judgment:   Good  Impulse Control:  Good    Reported Symptoms:  Patient reported feelings of loneliness, guilt, depressed mood at times, loss of interest, difficulty falling asleep and staying asleep, decreased appetite, easily distracted, irritability, stated "I use to worry about a lot of stuff", fidgety, and worry. Patient reported a history of fluctuations in mood. Patient reported symptoms started after the  death of her dog and reported symptoms are worse in the evenings. Patient reported she checks to ensure the stove, coffee pot, and water are off and turns the door knob several times prior to leaving her apartment. Patient reported she checks items multiple times before leaving her apartment.   Risk Assessment: Danger to Self:  No Patient denied current and past suicidal ideation and symptoms of psychosis Self-injurious Behavior: No Danger to Others: No Patient denied current and current homicidal ideation Duty to Warn:no Physical Aggression / Violence:No  Access to Firearms a concern: No  Gang Involvement:No  Patient / guardian was educated about steps to take if suicide or homicide risk level increases between visits: yes While future psychiatric events cannot be accurately predicted, the patient does not currently require acute inpatient psychiatric care and does not currently meet [facility] involuntary commitment criteria.  Substance Abuse History: Current substance abuse: No  current use. Patient reported no history of tobacco use or drug use. Patient reported a history of drinking a glass of wine "hardly ever" and reported last use approximately 30 years ago.   Past Psychiatric History:   No previous psychological problems have been observed Outpatient Providers: none History of Psych Hospitalization: No  Psychological Testing:  none     Abuse History:  Victim of: No.,  none    Report needed: No. Victim of Neglect:No. Perpetrator of  none reported   Witness / Exposure to Domestic Violence: No   Protective Services Involvement:  No  Witness to [employer] Violence:  No   Family History:  Family History  Problem Relation Age of Onset   Cancer Father        oral cancer   Cancer Sister        colon cancer and lymphoma   Colon cancer Sister    Breast cancer Neg Hx     Living situation: the patient lives alone  Sexual Orientation: Straight  Relationship Status:  divorced  Name of spouse / other: NA If a parent, number of children / ages: 0  Support Systems: brother in Social worker,  neighbors  Surveyor, quantity Stress:  Yes   Income/Employment/Disability: Neurosurgeon: No   Educational History: Education: some college  Religion/Sprituality/World View: Christian - Baptist  Any cultural differences that may affect / interfere with treatment:  not applicable   Recreation/Hobbies: gardening  Stressors: Financial difficulties   Loss of brother, sister, Surveyor, quantity    Strengths: Spirituality, talking to brother in Social worker and neighbors  Barriers:  multiple losses   Legal History: Pending legal issue / charges: The patient has no significant history of legal issues. History of legal issue / charges:  none  Medical History/Surgical History: reviewed Past Medical History:  Diagnosis Date   Anxiety    Asthma    Breast cancer (HCC) 02/07/2016   left breast invasive lobular carcinoma   CAD (coronary artery disease)    mild CAD by cath in 2001   Depression    Diverticulosis    Heart murmur    HTN (hypertension)    Hyperlipidemia    Palpitations    Personal history of radiation therapy 2018   left breast ca   Squamous cell carcinoma of skin 12/09/2017   R lat base of neck, SCCIS   Syncope    Pre-syncope    Past Surgical History:  Procedure Laterality Date   ABDOMINAL HYSTERECTOMY     BREAST BIOPSY Left 02/07/2016   grade II invasive and in situ mammary carcinoma   BREAST CYST ASPIRATION Left 02/13/2016   cyst   BREAST LUMPECTOMY Left 03/03/2016   invasive lobular carcinoma, clear margins, METASTATIC LOBULAR CARCINOMA, 2.5 MM, IN ONE LYMPH NODE (1/1).    CHOLECYSTECTOMY     KNEE ARTHROSCOPY Right    PARTIAL MASTECTOMY WITH NEEDLE LOCALIZATION Left 03/03/2016   Procedure: PARTIAL MASTECTOMY WITH NEEDLE LOCALIZATION;  Surgeon: Nadeen Landau, MD;  Location: [facility] ORS;  Service: General;  Laterality: Left;    SENTINEL NODE BIOPSY Left 03/03/2016   Procedure: SENTINEL NODE BIOPSY;  Surgeon: Nadeen Landau, MD;  Location: [facility] ORS;  Service: General;  Laterality: Left;   TUBAL LIGATION      Medications: Current Outpatient Medications  Medication Sig Dispense Refill   acetaminophen (TYLENOL) 500 MG tablet Take 1,000 mg by mouth every 6 (six) hours as needed for mild pain.     albuterol (VENTOLIN HFA) 108 (90 Base) MCG/ACT inhaler Inhale into the lungs every 6 (six) hours as needed for wheezing or shortness of breath.     ALPRAZolam (XANAX) 0.5 MG tablet TAKE ONE HALF (1/2) TABLET BY MOUTH IN THE MORNING AND ONE TABLET IN THE EVENING IF NEEDED. SEDATION CAUTION 45 tablet 1   amLODipine (NORVASC) 5 MG tablet Take 1 tablet (5 mg total) by mouth daily.     diclofenac (VOLTAREN) 50 MG EC tablet TAKE ONE TABLET BY MOUTH ONCE A DAY AS NEEDED WITH FOOD. 90 tablet 1   escitalopram (LEXAPRO) 20 MG  tablet TAKE ONE TABLET BY MOUTH ONCE A DAY FOR 30 DAYS 30 tablet 2   fluticasone (FLONASE) 50 MCG/ACT nasal spray Place into both nostrils.     hydrochlorothiazide (HYDRODIURIL) 25 MG tablet TAKE ONE TABLET BY MOUTH ONCE A DAY 90 tablet 3   loperamide (IMODIUM A-D) 2 MG tablet Take 1 tablet (2 mg total) by mouth 4 (four) times daily as needed for diarrhea or loose stools.     melatonin 5 MG TABS Take 1 tablet (5 mg total) by mouth at bedtime as needed.     montelukast (SINGULAIR) 10 MG tablet TAKE ONE TABLET BY MOUTH ONCE A DAY 90 tablet 3   nystatin powder APPLY 1 APPLICATION TOPICALLY 3 TIMES DAILY 60 g 3   rosuvastatin (CRESTOR) 10 MG tablet TAKE ONE TABLET BY MOUTH EVERY NIGHT AT BEDTIME 90 tablet 1   No current facility-administered medications for this visit.    Allergies  Allergen Reactions   Penicillins Anaphylaxis    REACTION: severe anaphylaxis Has patient had a PCN reaction causing immediate rash, facial/tongue/throat swelling, SOB or lightheadedness with hypotension: Yes Has patient had a PCN  reaction causing severe rash involving mucus membranes or skin necrosis: No Has patient had a PCN reaction that required hospitalization No Has patient had a PCN reaction occurring within the last 10 years: No If all of the above answers are "NO", then may proceed with Cephalosporin use.    Zithromax [Azithromycin] Rash    z-pack = rash   Anastrozole Other (See Comments)    Extreme sweating   Cefuroxime Axetil Other (See Comments)    unknown   Codeine Other (See Comments)    unknown   Erythromycin Other (See Comments)    unknown   Nitrofurantoin Other (See Comments)    unknown   Ofloxacin Other (See Comments)    unknown   Prednisone Other (See Comments)   Propoxyphene Other (See Comments)   Tetracycline Other (See Comments)    unknown   Sulfa Antibiotics Other (See Comments) and Rash    Diagnoses:  Adjustment disorder with mixed anxiety and depressed mood R/O Major Depressive Disorder and Obsessive-Compulsive Disorder  Plan of Care: Patient is a 77 year old female who presented for an initial assessment. Clinician conducted initial assessment in person from clinician's office at [facility]. Patient reported multiple losses and feelings of loneliness when clinician inquired about reason for today's visit. Patient reported the following symptoms: feelings of loneliness, guilt, depressed mood at times, loss of interest, difficulty falling asleep and staying asleep, decreased appetite, easily distracted, irritability, fidgety, worry, checks multiple times to ensure the stove, coffee pot, and water are turned off and turns the door knob several times prior to leaving her apartment. Patient reported a history of fluctuations in mood. Patient reported symptoms started after the death of her dog and reported symptoms are worse in the evenings.  Patient denied current and past suicidal ideation, homicidal ideation, and symptoms of psychosis. Patient reported no current substance use.  Patient reported no history of tobacco or drug use. Patient reported a history of drinking a glass of wine "hardly ever" and reported last use was approximately 30 years ago. Patient reported no history of outpatient or inpatient psychiatric treatment. Patient reported finances and the loss of her brother, sister, and dog, Irene Limbo, are current stressors. Patient identified her brother in law and neighbors as supports. It is recommended patient be referred to a psychiatrist for a consult and recommended patient participate in  individual therapy. Clinician will review recommendations and treatment plan with patient during follow up appointment.  Collaboration of Care: Other not required at this time   Doree Barthel, LCSW

## 2022-09-08 NOTE — Progress Notes (Signed)
                Lorma Heater, LCSW 

## 2022-09-15 ENCOUNTER — Telehealth: Payer: Self-pay | Admitting: Family Medicine

## 2022-09-15 ENCOUNTER — Telehealth: Payer: Self-pay

## 2022-09-15 NOTE — Progress Notes (Cosign Needed)
Care Management & Coordination Services Pharmacy Team  Reason for Encounter: General Adherence Update   Contacted patient for general health update and medication adherence call.  Spoke with patient on 09/15/2022   What concerns do you have about your medications? None  The patient denies side effects with their medications.   How often do you forget or accidentally miss a dose? Never  Do you use a pillbox? Yes  Are you having any problems getting your medications from your pharmacy? No  Has the cost of your medications been a concern? No  Since last visit with PharmD, no interventions have been made.   The patient has not had an ED visit since last contact.   The patient reports the following problems with their health. Patient stated she has noticed her ankles swelling more than normal. I advised for patient to make an appointment to see Dr. Para March to discuss.  Patient stated she would call and make one.   Patient denies concerns or questions for Al Corpus, PharmD at this time.   Chart Updates:  Recent office visits:  07/23/22 Crawford Givens, MD Anxiety Start: Melatonin 5 mg Stop (patient): Benzonatate 200 mg Stop: Clarithromycin 500 mg  Recent consult visits:  07/13/22 Muscle weakness PT Treatment No other information 07/09/22 Muscle weakness PT Treatment No other information 07/08/22 Owens Shark, MD (Oncology) F/U surveillance of breast cancer Ordered: Mammogram No med changes F/U 1 year 07/07/22 Muscle weakness PT Treatment No other information  Hospital visits:  None in previous 6 months  Medications: Outpatient Encounter Medications as of 09/15/2022  Medication Sig   acetaminophen (TYLENOL) 500 MG tablet Take 1,000 mg by mouth every 6 (six) hours as needed for mild pain.   albuterol (VENTOLIN HFA) 108 (90 Base) MCG/ACT inhaler Inhale into the lungs every 6 (six) hours as needed for wheezing or shortness of breath.   ALPRAZolam (XANAX) 0.5 MG tablet TAKE ONE  HALF (1/2) TABLET BY MOUTH IN THE MORNING AND ONE TABLET IN THE EVENING IF NEEDED. SEDATION CAUTION   amLODipine (NORVASC) 5 MG tablet Take 1 tablet (5 mg total) by mouth daily.   diclofenac (VOLTAREN) 50 MG EC tablet TAKE ONE TABLET BY MOUTH ONCE A DAY AS NEEDED WITH FOOD.   escitalopram (LEXAPRO) 20 MG tablet TAKE ONE TABLET BY MOUTH ONCE A DAY FOR 30 DAYS   fluticasone (FLONASE) 50 MCG/ACT nasal spray Place into both nostrils.   hydrochlorothiazide (HYDRODIURIL) 25 MG tablet TAKE ONE TABLET BY MOUTH ONCE A DAY   loperamide (IMODIUM A-D) 2 MG tablet Take 1 tablet (2 mg total) by mouth 4 (four) times daily as needed for diarrhea or loose stools.   melatonin 5 MG TABS Take 1 tablet (5 mg total) by mouth at bedtime as needed.   montelukast (SINGULAIR) 10 MG tablet TAKE ONE TABLET BY MOUTH ONCE A DAY   nystatin powder APPLY 1 APPLICATION TOPICALLY 3 TIMES DAILY   rosuvastatin (CRESTOR) 10 MG tablet TAKE ONE TABLET BY MOUTH EVERY NIGHT AT BEDTIME   No facility-administered encounter medications on file as of 09/15/2022.    Recent vitals BP Readings from Last 3 Encounters:  07/23/22 (!) 142/58  07/08/22 114/72  05/14/22 (!) 146/66   Pulse Readings from Last 3 Encounters:  07/23/22 64  07/08/22 60  05/14/22 70   Wt Readings from Last 3 Encounters:  07/23/22 186 lb (84.4 kg)  07/08/22 186 lb 14.4 oz (84.8 kg)  05/14/22 187 lb (84.8 kg)   BMI Readings from Last  3 Encounters:  07/23/22 31.93 kg/m  07/08/22 32.08 kg/m  05/14/22 32.61 kg/m    Recent lab results    Component Value Date/Time   NA 129 (L) 05/14/2022 1250   K 3.4 (L) 05/14/2022 1250   CL 89 (L) 05/14/2022 1250   CO2 31 05/14/2022 1250   GLUCOSE 113 (H) 05/14/2022 1250   BUN 15 05/14/2022 1250   CREATININE 0.83 05/14/2022 1250   CALCIUM 9.1 05/14/2022 1250    Lab Results  Component Value Date   CREATININE 0.83 05/14/2022   GFR 68.11 05/14/2022   GFRNONAA >60 06/07/2020   GFRAA >60 05/16/2019   Lab Results   Component Value Date/Time   HGBA1C (H) 06/18/2010 06:32 PM    6.0 (NOTE)                                                                       According to the ADA Clinical Practice Recommendations for 2011, when HbA1c is used as a screening test:   >=6.5%   Diagnostic of Diabetes Mellitus           (if abnormal result  is confirmed)  5.7-6.4%   Increased risk of developing Diabetes Mellitus  References:Diagnosis and Classification of Diabetes Mellitus,Diabetes Care,2011,34(Suppl 1):S62-S69 and Standards of Medical Care in         Diabetes - 2011,Diabetes Care,2011,34  (Suppl 1):S11-S61.    Lab Results  Component Value Date   CHOL 237 (H) 09/01/2021   HDL 72.80 09/01/2021   LDLCALC 136 (H) 09/01/2021   LDLDIRECT 161.2 11/17/2011   TRIG 141.0 09/01/2021   CHOLHDL 3 09/01/2021    Care Gaps: Annual wellness visit in last year? Yes09/04/2021  Star Rating Drugs:  Medication:  Last Fill: Day Supply Rosuvastatin 10 mg 09/08/2022 90  Al Corpus, PharmD notified  Claudina Lick, [place] Clinical Pharmacy Assistant [phone]

## 2022-09-15 NOTE — Telephone Encounter (Signed)
Noted  

## 2022-09-15 NOTE — Telephone Encounter (Signed)
Pt called in requesting an appt with Dr. Para March for swollen ankles. Pt states she had been seen for the swelling before but she believes her swelling has gotten worse. Per pt's request, I scheduled her an appt for Tues, 6/11 with Para March. Transferred pt to access nurse. Call back # [phone]

## 2022-09-15 NOTE — Telephone Encounter (Signed)
I spoke with pt; pt said she is having more swelling than usual in both ankles. No lower leg swelling or pain. No CP or SOB. Pt scheduled appt with Allayne Gitelman NP on 09/16/22 at 2:40 with UC & ED precautions and pt voiced understanding. Pt did not want to cancel appt yet  with Dr Para March for next wk.sending note to Allayne Gitelman NP a;nd Lorain Childes to Dr Para March as PCP.

## 2022-09-16 ENCOUNTER — Ambulatory Visit (INDEPENDENT_AMBULATORY_CARE_PROVIDER_SITE_OTHER): Payer: Medicare HMO | Admitting: Primary Care

## 2022-09-16 ENCOUNTER — Encounter: Payer: Self-pay | Admitting: Primary Care

## 2022-09-16 VITALS — BP 130/76 | HR 66 | Temp 97.3°F | Ht 64.0 in | Wt 190.0 lb

## 2022-09-16 DIAGNOSIS — M25472 Effusion, left ankle: Secondary | ICD-10-CM | POA: Diagnosis not present

## 2022-09-16 DIAGNOSIS — M25471 Effusion, right ankle: Secondary | ICD-10-CM

## 2022-09-16 NOTE — Patient Instructions (Signed)
Stop by the lab prior to leaving today. I will notify you of your results once received.   Start walking at least 5 to 10 minutes every day to help with blood flow circulation to your legs.  Follow-up with Dr. Para March as schedule.

## 2022-09-16 NOTE — Assessment & Plan Note (Addendum)
Mild without obvious complication.  We discussed differentials for her swelling including calcium channel blocker side effects, increased sedentary lifestyle, increased intake of salty foods, chronic venous insufficiency.  I offered to discontinue her amlodipine and switch her to losartan, she kindly declines. Will check labs today including BNP and BMP.  Continue to wear compression stockings daily.  She may need some nonurgent updated blood flow studies given her moderate to severe varicose veins on the left lower extremity.  No evidence of DVT or cellulitis today.  She will see PCP next week who will reevaluate. Return precautions provided.

## 2022-09-16 NOTE — Telephone Encounter (Signed)
Noted. Thanks.

## 2022-09-16 NOTE — Progress Notes (Signed)
Subjective:    Patient ID: Jamie Curry, female    DOB: [birth year], 77 y.o.   MRN: [id number]  HPI  Jamie Curry is a very pleasant 77 y.o. female patient of Dr. Para March with a history of hypertension, asthma, osteoarthritis, hyperlipidemia, carotid artery stenosis, breast cancer, lower extremity edema who presents today to discuss ankle edema.  Evaluated by PCP in April 2024 for bilateral ankle edema and anxiety.  She is managed on amlodipine 5 mg daily for hypertension, no changes were made during this visit as edema was mild.   Today she discusses a 1 month history of bilateral ankle edema that has increased since her visit in April. She's been compliant to her support stockings for her chronic varicose veins, she takes them off at night.  She does not currently see vascular services for her varicose veins.  She's been taking amlodipine for several years. She spends most of her day inside of her home, sits most of the day, does elevated her legs when sitting. She admits that she's less active since her dog passed away in 29-Nov-2021. She used to walk her dog numerous times daily when he was alive. She also admits to eating more fast food and salty take out food recently.   She denies injury, erythema, cough, exertional dyspnea, calf pain. She is checking her BP at home which is running 120's-150's/70's-80's.   She underwent echocardiogram in June 2023 with LVEF of 55 to 60%, grade 1 diastolic dysfunction, aortic valve sclerosis without stenosis.   BP Readings from Last 3 Encounters:  09/16/22 130/76  07/23/22 (!) 142/58  07/08/22 114/72     Review of Systems  Constitutional:  Negative for fever.  Respiratory:  Negative for shortness of breath.   Cardiovascular:  Positive for leg swelling. Negative for chest pain.       Bilateral ankle edema  Skin:  Negative for color change.         Past Medical History:  Diagnosis Date   Anxiety    Asthma    Breast cancer (HCC)  02/07/2016   left breast invasive lobular carcinoma   CAD (coronary artery disease)    mild CAD by cath in 2001   Depression    Diverticulosis    Heart murmur    HTN (hypertension)    Hyperlipidemia    Palpitations    Personal history of radiation therapy 2018   left breast ca   Squamous cell carcinoma of skin 12/09/2017   R lat base of neck, SCCIS   Syncope    Pre-syncope    Social History   Socioeconomic History   Marital status: Married    Spouse name: Not on file   Number of children: Not on file   Years of education: Not on file   Highest education level: Not on file  Occupational History   Not on file  Tobacco Use   Smoking status: Never   Smokeless tobacco: Never  Substance and Sexual Activity   Alcohol use: No    Alcohol/week: 0.0 standard drinks of alcohol   Drug use: No   Sexual activity: Not on file  Other Topics Concern   Not on file  Social History Narrative   Lives alone with her dog.  Divorced.     No kids    Prev worked assembly work.  Retired at age 72.    Social Determinants of Health   Financial Resource Strain: Low Risk  (12/12/2021)   Overall  Financial Resource Strain (CARDIA)    Difficulty of Paying Living Expenses: Not very hard  Food Insecurity: No Food Insecurity (12/12/2021)   Hunger Vital Sign    Worried About Running Out of Food in the Last Year: Never true    Ran Out of Food in the Last Year: Never true  Transportation Needs: No Transportation Needs (12/12/2021)   PRAPARE - Administrator, Civil Service (Medical): No    Lack of Transportation (Non-Medical): No  Physical Activity: Insufficiently Active (12/12/2021)   Exercise Vital Sign    Days of Exercise per Week: 3 days    Minutes of Exercise per Session: 30 min  Stress: No Stress Concern Present (12/12/2021)   [employer] of Occupational Health - Occupational Stress Questionnaire    Feeling of Stress : Only a little  Social Connections: Moderately Isolated  (12/12/2021)   Social Connection and Isolation Panel [NHANES]    Frequency of Communication with Friends and Family: More than three times a week    Frequency of Social Gatherings with Friends and Family: Twice a week    Attends Religious Services: More than 4 times per year    Active Member of [employer] or Organizations: No    Attends Banker Meetings: Never    Marital Status: Divorced  Catering manager Violence: Not At Risk (12/12/2021)   Humiliation, Afraid, Rape, and Kick questionnaire    Fear of Current or Ex-Partner: No    Emotionally Abused: No    Physically Abused: No    Sexually Abused: No    Past Surgical History:  Procedure Laterality Date   ABDOMINAL HYSTERECTOMY     BREAST BIOPSY Left 02/07/2016   grade II invasive and in situ mammary carcinoma   BREAST CYST ASPIRATION Left 02/13/2016   cyst   BREAST LUMPECTOMY Left 03/03/2016   invasive lobular carcinoma, clear margins, METASTATIC LOBULAR CARCINOMA, 2.5 MM, IN ONE LYMPH NODE (1/1).    CHOLECYSTECTOMY     KNEE ARTHROSCOPY Right    PARTIAL MASTECTOMY WITH NEEDLE LOCALIZATION Left 03/03/2016   Procedure: PARTIAL MASTECTOMY WITH NEEDLE LOCALIZATION;  Surgeon: Nadeen Landau, MD;  Location: [facility] OR[facility];  Service: General;  Laterality: Left;   SENTINEL NODE BIOPSY Left 03/03/2016   Procedure: SENTINEL NODE BIOPSY;  Surgeon: Nadeen Landau, MD;  Location: [facility];  Service: General;  Laterality: Left;   TUBAL LIGATION      Family History  Problem Relation Age of Onset   Cancer Father        oral cancer   Cancer Sister        colon cancer and lymphoma   Colon cancer Sister    Breast cancer Neg Hx     Allergies  Allergen Reactions   Penicillins Anaphylaxis    REACTION: severe anaphylaxis Has patient had a PCN reaction causing immediate rash, facial/tongue/throat swelling, SOB or lightheadedness with hypotension: Yes Has patient had a PCN reaction causing severe rash involving mucus membranes or skin  necrosis: No Has patient had a PCN reaction that required hospitalization No Has patient had a PCN reaction occurring within the last 10 years: No If all of the above answers are "NO", then may proceed with Cephalosporin use.    Zithromax [Azithromycin] Rash    z-pack = rash   Anastrozole Other (See Comments)    Extreme sweating   Cefuroxime Axetil Other (See Comments)    unknown   Codeine Other (See Comments)    unknown   Erythromycin Other (  See Comments)    unknown   Nitrofurantoin Other (See Comments)    unknown   Ofloxacin Other (See Comments)    unknown   Prednisone Other (See Comments)   Propoxyphene Other (See Comments)   Tetracycline Other (See Comments)    unknown   Sulfa Antibiotics Other (See Comments) and Rash    Current Outpatient Medications on File Prior to Visit  Medication Sig Dispense Refill   acetaminophen (TYLENOL) 500 MG tablet Take 1,000 mg by mouth every 6 (six) hours as needed for mild pain.     albuterol (VENTOLIN HFA) 108 (90 Base) MCG/ACT inhaler Inhale into the lungs every 6 (six) hours as needed for wheezing or shortness of breath.     ALPRAZolam (XANAX) 0.5 MG tablet TAKE ONE HALF (1/2) TABLET BY MOUTH IN THE MORNING AND ONE TABLET IN THE EVENING IF NEEDED. SEDATION CAUTION 45 tablet 1   amLODipine (NORVASC) 5 MG tablet Take 1 tablet (5 mg total) by mouth daily.     diclofenac (VOLTAREN) 50 MG EC tablet TAKE ONE TABLET BY MOUTH ONCE A DAY AS NEEDED WITH FOOD. 90 tablet 1   escitalopram (LEXAPRO) 20 MG tablet TAKE ONE TABLET BY MOUTH ONCE A DAY FOR 30 DAYS 30 tablet 2   fluticasone (FLONASE) 50 MCG/ACT nasal spray Place into both nostrils.     hydrochlorothiazide (HYDRODIURIL) 25 MG tablet TAKE ONE TABLET BY MOUTH ONCE A DAY 90 tablet 3   loperamide (IMODIUM A-D) 2 MG tablet Take 1 tablet (2 mg total) by mouth 4 (four) times daily as needed for diarrhea or loose stools.     melatonin 5 MG TABS Take 1 tablet (5 mg total) by mouth at bedtime as needed.      montelukast (SINGULAIR) 10 MG tablet TAKE ONE TABLET BY MOUTH ONCE A DAY 90 tablet 3   nystatin powder APPLY 1 APPLICATION TOPICALLY 3 TIMES DAILY 60 g 3   rosuvastatin (CRESTOR) 10 MG tablet TAKE ONE TABLET BY MOUTH EVERY NIGHT AT BEDTIME 90 tablet 1   No current facility-administered medications on file prior to visit.    BP 130/76   Pulse 66   Temp (!) 97.3 F (36.3 C) (Temporal)   Ht 5\' 4"  (1.626 m)   Wt 190 lb (86.2 kg)   SpO2 99%   BMI 32.61 kg/m  Objective:   Physical Exam Cardiovascular:     Rate and Rhythm: Normal rate and regular rhythm.     Pulses:          Dorsalis pedis pulses are 2+ on the right side and 2+ on the left side.       Posterior tibial pulses are 2+ on the right side and 2+ on the left side.     Comments: Mild bilateral ankle edema, left > right. No pitting.   Moderate varicose veins to left lower extremity. Pulmonary:     Effort: Pulmonary effort is normal.     Breath sounds: Normal breath sounds. No rhonchi.  Abdominal:     General: There is no distension.  Skin:    General: Skin is warm and dry.     Findings: No erythema.  Neurological:     Mental Status: She is alert.           Assessment & Plan:  Ankle edema, bilateral Assessment & Plan: Mild without obvious complication.  We discussed differentials for her swelling including calcium channel blocker side effects, increased sedentary lifestyle, increased intake of salty foods, chronic  venous insufficiency.  I offered to discontinue her amlodipine and switch her to losartan, she kindly declines. Will check labs today including BNP and BMP.  Continue to wear compression stockings daily.  She may need some nonurgent updated blood flow studies given her moderate to severe varicose veins on the left lower extremity.  No evidence of DVT or cellulitis today.  She will see PCP next week who will reevaluate. Return precautions provided.  Orders: -     Basic metabolic panel -      Brain natriuretic peptide        Doreene Nest, NP

## 2022-09-17 DIAGNOSIS — R609 Edema, unspecified: Secondary | ICD-10-CM | POA: Diagnosis not present

## 2022-09-17 DIAGNOSIS — M25472 Effusion, left ankle: Secondary | ICD-10-CM | POA: Diagnosis not present

## 2022-09-17 DIAGNOSIS — M25471 Effusion, right ankle: Secondary | ICD-10-CM | POA: Diagnosis not present

## 2022-09-17 LAB — BASIC METABOLIC PANEL
BUN: 20 mg/dL (ref 6–23)
CO2: 31 mEq/L (ref 19–32)
Calcium: 9.4 mg/dL (ref 8.4–10.5)
Chloride: 96 mEq/L (ref 96–112)
Creatinine, Ser: 0.95 mg/dL (ref 0.40–1.20)
GFR: 57.78 mL/min — ABNORMAL LOW (ref >60.00)
Glucose, Bld: 109 mg/dL — ABNORMAL HIGH (ref 70–99)
Potassium: 3.6 mEq/L (ref 3.5–5.1)
Sodium: 137 mEq/L (ref 135–145)

## 2022-09-17 NOTE — Addendum Note (Signed)
Addended by: Alvina Chou on: 09/17/2022 04:25 PM   Modules accepted: Orders

## 2022-09-18 LAB — BRAIN NATRIURETIC PEPTIDE: Brain Natriuretic Peptide: 23 pg/mL (ref <100)

## 2022-09-22 ENCOUNTER — Encounter: Payer: Self-pay | Admitting: Family Medicine

## 2022-09-22 ENCOUNTER — Ambulatory Visit (INDEPENDENT_AMBULATORY_CARE_PROVIDER_SITE_OTHER): Payer: Medicare HMO | Admitting: Family Medicine

## 2022-09-22 VITALS — BP 122/52 | HR 69 | Temp 97.2°F | Ht 64.0 in | Wt 188.0 lb

## 2022-09-22 DIAGNOSIS — I8390 Asymptomatic varicose veins of unspecified lower extremity: Secondary | ICD-10-CM

## 2022-09-22 DIAGNOSIS — R609 Edema, unspecified: Secondary | ICD-10-CM | POA: Diagnosis not present

## 2022-09-22 NOTE — Progress Notes (Unsigned)
Has been using support stockings at baseline- longstanding use.  She uses calf high 20-78mmHg compression stockings.  She has been using them at baseline.  She can check her BP at home.    Longstanding history of varicose veins.  No ulceration or bleeding.  History of lower extremity edema noted.  Last office visit and recent labs discussed with patient.  BNP not elevated. Meds, vitals, and allergies reviewed.   ROS: Per HPI unless specifically indicated in ROS section   Nad Ncat Neck supple, no LA Rrr Ctab Skin well perfused.   Trace BLE edema.  L>R leg varicose veins

## 2022-09-22 NOTE — Patient Instructions (Signed)
You should get a call about seeing the vein clinic in [place].   Try cutting amlodipine in half for 1 week.  If the swelling isn't better and your BP is still below 140/90, then stop it for 1 week.    Let me know how that goes.    Take care.  Glad to see you.

## 2022-09-23 DIAGNOSIS — I8390 Asymptomatic varicose veins of unspecified lower extremity: Secondary | ICD-10-CM | POA: Insufficient documentation

## 2022-09-23 DIAGNOSIS — I83813 Varicose veins of bilateral lower extremities with pain: Secondary | ICD-10-CM | POA: Insufficient documentation

## 2022-09-23 NOTE — Assessment & Plan Note (Signed)
Could be related to amlodipine versus varicose veins. Reasonable to try cutting amlodipine in half for 1 week.  If the swelling isn't better and BP is still below 140/90, then stop it for 1 week.    She can let me know how that goes.  She agrees to plan.

## 2022-09-23 NOTE — Assessment & Plan Note (Signed)
  D/w pt about [place] vascular eval. referral placed. Prescription written for 2 pairs of calf high 20-50mmHg compression stockings.  1 refill.  Continue baseline use.

## 2022-09-24 ENCOUNTER — Telehealth: Payer: Self-pay | Admitting: Family Medicine

## 2022-09-24 NOTE — Telephone Encounter (Signed)
Patient called in and had some questions regarding this from her after visit summary:  "Try cutting amlodipine in half for 1 week.  If the swelling isn't better and your BP is still below 140/90, then stop it for 1 week. "  Please advise. Thank you!

## 2022-09-24 NOTE — Telephone Encounter (Signed)
Directions given to patient and patient verbalized understanding.

## 2022-09-24 NOTE — Telephone Encounter (Signed)
Would take 1/2 tab of amlodipine for 1 week.  See if the swelling is better.  Is so, continue as is.    If swelling isn't better but BP isn't high, then try stopping the medicine.    Thanks.

## 2022-09-24 NOTE — Telephone Encounter (Signed)
Patient would like clarity on the instructions, she is nervous stopping her BP medication completley.

## 2022-10-02 NOTE — Telephone Encounter (Signed)
Patient called Jamie Curry stating that she have been taking half of the amlodipine. She said that her swelling is a little better,and her b/p is running 142/63 at the highest,but staying around 135/66 . She asked could her new prescription be called In for her to meet the new quantity of her taking a half now.  [facility] - [place], [place] - [address] Phone: [phone]  Fax: [phone]

## 2022-10-02 NOTE — Telephone Encounter (Signed)
Called and advised patient Dr. Para March is out of office today and will return on Monday. Advised I will send Dr. Para March the update and she should continue as is with the 1/2 tab of amlodipine for now and can expect a response next week.

## 2022-10-04 MED ORDER — AMLODIPINE BESYLATE 2.5 MG PO TABS: 2.5000 mg | ORAL_TABLET | Freq: Every day | ORAL | 3 refills | Status: DC

## 2022-10-04 NOTE — Addendum Note (Signed)
Addended by: Joaquim Nam on: 10/04/2022 08:38 PM   Modules accepted: Orders

## 2022-10-04 NOTE — Telephone Encounter (Signed)
Sent new rx for amlodipine 2.5mg  per day.  She will not need to cut those pills in half.  Thanks.

## 2022-10-05 NOTE — Telephone Encounter (Signed)
Patient notified new rx was sent.  

## 2022-10-07 ENCOUNTER — Ambulatory Visit: Payer: Medicare HMO | Admitting: Clinical

## 2022-10-19 ENCOUNTER — Other Ambulatory Visit: Payer: Self-pay | Admitting: Family Medicine

## 2022-10-19 DIAGNOSIS — C50412 Malignant neoplasm of upper-outer quadrant of left female breast: Secondary | ICD-10-CM

## 2022-10-19 NOTE — Telephone Encounter (Signed)
LAST APPOINTMENT DATE: 07/23/22   NEXT APPOINTMENT DATE: no f/u     LAST REFILL: 08/25/22  QTY: #[phone]

## 2022-10-22 ENCOUNTER — Telehealth: Payer: Self-pay | Admitting: Family Medicine

## 2022-10-22 ENCOUNTER — Ambulatory Visit: Payer: Medicare HMO | Admitting: Clinical

## 2022-10-22 DIAGNOSIS — F4323 Adjustment disorder with mixed anxiety and depressed mood: Secondary | ICD-10-CM | POA: Diagnosis not present

## 2022-10-22 NOTE — Telephone Encounter (Signed)
Pt dropped off a log of her recent BP recordings since 6/17, along with her current meds she's taking. Log is in Duncan's folder. Call back # [phone]

## 2022-10-22 NOTE — Telephone Encounter (Signed)
Placed notes in Dr. Lianne Bushy inbox for review.

## 2022-10-22 NOTE — Progress Notes (Signed)
    [facility] Counselor/Therapist Progress Note  Patient ID: Jamie Curry, MRN: [id number]    Date: 10/22/22  Time Spent: 10:32  am - 11:24 am : 52 Minutes  Treatment Type: Individual Therapy.  Reported Symptoms: Patient reported feelings of anxiety and panic at times, difficulty staying asleep  Mental Status Exam: Appearance:  Neat and Well Groomed     Behavior: Appropriate  Motor: Normal  Speech/Language:  Clear and Coherent  Affect: Appropriate  Mood: normal  Thought process: normal  Thought content:   WNL  Sensory/Perceptual disturbances:   WNL  Orientation: oriented to person, place, and situation  Attention: Good  Concentration: Good  Memory: WNL  Fund of knowledge:  Good  Insight:   Good  Judgment:  Good  Impulse Control: Good   Risk Assessment: Danger to Self:  No Patient denied current suicidal ideation  Self-injurious Behavior: No Danger to Others: No Patient denied current homicidal ideation Duty to Warn:no Physical Aggression / Violence:No  Access to Firearms a concern: No  Gang Involvement:No   Subjective:  Patient stated, "I guess pretty much about the same" in response to symptoms since last session.  Patient reported next month will be the one year anniversary of her dog's death. Patient reported she continues to experience feelings of anxiety and panic at times. Patient reported she continues to experience loneliness. Patient reported she wants to start going to the [facility]. Patient reported she still thinks about the decision to put her dog to sleep and feels guilt associated with that decision. Patient reported difficulty staying asleep at times and reported she is currently taking medication for sleep. Patient stated, "I don't think it's got any worse" in response to patient's mood since last session. Patient reported she sleeps as a way to cope with her thoughts. Patient stated, "I feel good" in response to patient's current mood. Patient  reported she continues to check to ensure the stove is off and the doors are locked multiple times before leaving her home. Patient stated, "it's alright" in response to participation in therapy.    Interventions: Clinician conducted session in person at clinician's office at [facility]. Assessed patient's mood since last session and patient's current mood. Clinician reviewed diagnosis and treatment recommendations. Provided psycho education related to diagnosis and treatment.   Collaboration of Care: Other Patient requested to complete a consent for patient's PCP, Dr. Crawford Givens at [facility].   Patient/Guardian was advised Release of Information must be obtained prior to any record release in order to collaborate their care with an outside provider. Patient/Guardian was advised if they have not already done so to contact [facility] to sign all necessary forms in order for [place] to release information regarding their care.   Diagnosis:  Adjustment disorder with mixed anxiety and depressed mood  R/O Major Depressive Disorder and Obsessive-Compulsive Disorder    Plan: Goals to be determined during follow up appointment on 11/26/22.            Doree Barthel, LCSW

## 2022-10-23 NOTE — Telephone Encounter (Signed)
Spoke with patient about BP readings and to continue medication as is. Patient stated swelling is under control right now and will call back if she has any issues.

## 2022-10-23 NOTE — Telephone Encounter (Signed)
Thanks for the update. Usually ~130s/60-70s, taking 2.5mg  amlodipine.  If feeling well/less swelling, would continue as is.  If still having swelling in spite of compression stockings, then let me me know.

## 2022-11-03 DIAGNOSIS — H16223 Keratoconjunctivitis sicca, not specified as Sjogren's, bilateral: Secondary | ICD-10-CM | POA: Diagnosis not present

## 2022-11-04 ENCOUNTER — Ambulatory Visit (INDEPENDENT_AMBULATORY_CARE_PROVIDER_SITE_OTHER): Payer: Medicare HMO | Admitting: Dermatology

## 2022-11-04 VITALS — BP 112/63 | HR 66

## 2022-11-04 DIAGNOSIS — L820 Inflamed seborrheic keratosis: Secondary | ICD-10-CM | POA: Diagnosis not present

## 2022-11-04 DIAGNOSIS — L821 Other seborrheic keratosis: Secondary | ICD-10-CM

## 2022-11-04 DIAGNOSIS — L578 Other skin changes due to chronic exposure to nonionizing radiation: Secondary | ICD-10-CM | POA: Diagnosis not present

## 2022-11-04 DIAGNOSIS — L918 Other hypertrophic disorders of the skin: Secondary | ICD-10-CM | POA: Diagnosis not present

## 2022-11-04 DIAGNOSIS — W908XXA Exposure to other nonionizing radiation, initial encounter: Secondary | ICD-10-CM

## 2022-11-04 DIAGNOSIS — L570 Actinic keratosis: Secondary | ICD-10-CM | POA: Diagnosis not present

## 2022-11-04 DIAGNOSIS — L720 Epidermal cyst: Secondary | ICD-10-CM | POA: Diagnosis not present

## 2022-11-04 DIAGNOSIS — L729 Follicular cyst of the skin and subcutaneous tissue, unspecified: Secondary | ICD-10-CM

## 2022-11-04 NOTE — Progress Notes (Signed)
Follow-Up Visit   Subjective  Jamie Curry is a 77 y.o. female who presents for the following: patient complains of a bump at left waist line that will not go away and sometimes is painful and itches, spot at left abdomen that she scratches at   The patient has spots, moles and lesions to be evaluated, some may be new or changing and the patient may have concern these could be cancer.   The following portions of the chart were reviewed this encounter and updated as appropriate: medications, allergies, medical history  Review of Systems:  No other skin or systemic complaints except as noted in HPI or Assessment and Plan.  Objective  Well appearing patient in no apparent distress; mood and affect are within normal limits.   A focused examination was performed of the following areas: Left waistline , left abdomen , left arm, face,  Relevant exam findings are noted in the Assessment and Plan.  left lateral waistline x 1, left abdomen x 1, left waistline x 1 (3) Erythematous stuck-on, waxy papule or plaque  left temple x 1, right nose x 1 (2) Erythematous thin papules/macules with gritty scale.   right neck x 1, left abdomen x 1 (2) Inflamed erythematous fleshy skin colored pedunculated papules     Assessment & Plan    SEBORRHEIC KERATOSIS - Stuck-on, waxy, tan-brown papules and/or plaques  - Benign-appearing - Discussed benign etiology and prognosis. - Observe - Call for any changes  MILIA At face Exam: tiny erythematous firm white papule  Treatment Plan: Benign. Observe.  Discussed this is a type of cyst. Benign-appearing.    ACTINIC DAMAGE - chronic, secondary to cumulative UV radiation exposure/sun exposure over time - diffuse scaly erythematous macules with underlying dyspigmentation - Recommend daily broad spectrum sunscreen SPF 30+ to sun-exposed areas, reapply every 2 hours as needed.  - Recommend staying in the shade or wearing long sleeves, sun glasses  (UVA+UVB protection) and wide brim hats (4-inch brim around the entire circumference of the hat). - Call for new or changing lesions.  Inflamed seborrheic keratosis (3) left lateral waistline x 1, left abdomen x 1, left waistline x 1  Symptomatic, irritating, patient would like treated.  Destruction of lesion - left lateral waistline x 1, left abdomen x 1, left waistline x 1 (3) Complexity: simple   Destruction method: cryotherapy   Informed consent: discussed and consent obtained   Timeout:  patient name, date of birth, surgical site, and procedure verified Lesion destroyed using liquid nitrogen: Yes   Region frozen until ice ball extended beyond lesion: Yes   Outcome: patient tolerated procedure well with no complications   Post-procedure details: wound care instructions given    Actinic keratosis (2) left temple x 1, right nose x 1  Actinic keratoses are precancerous spots that appear secondary to cumulative UV radiation exposure/sun exposure over time. They are chronic with expected duration over 1 year. A portion of actinic keratoses will progress to squamous cell carcinoma of the skin. It is not possible to reliably predict which spots will progress to skin cancer and so treatment is recommended to prevent development of skin cancer.  Recommend daily broad spectrum sunscreen SPF 30+ to sun-exposed areas, reapply every 2 hours as needed.  Recommend staying in the shade or wearing long sleeves, sun glasses (UVA+UVB protection) and wide brim hats (4-inch brim around the entire circumference of the hat). Call for new or changing lesions.  Destruction of lesion - left temple x  1, right nose x 1 (2) Complexity: simple   Destruction method: cryotherapy   Informed consent: discussed and consent obtained   Timeout:  patient name, date of birth, surgical site, and procedure verified Lesion destroyed using liquid nitrogen: Yes   Region frozen until ice ball extended beyond lesion: Yes    Outcome: patient tolerated procedure well with no complications   Post-procedure details: wound care instructions given    Inflamed acrochordon (2) right neck x 1, left abdomen x 1  Irritated and bothering patient    Treated with LN2  Destruction of lesion - right neck x 1, left abdomen x 1 (2) Complexity: simple   Destruction method: cryotherapy   Informed consent: discussed and consent obtained   Timeout:  patient name, date of birth, surgical site, and procedure verified Lesion destroyed using liquid nitrogen: Yes   Region frozen until ice ball extended beyond lesion: Yes   Outcome: patient tolerated procedure well with no complications   Post-procedure details: wound care instructions given       Return for keep follow up in 02/04/23.  IAsher Muir, CMA, am acting as scribe for Armida Sans, MD.   Documentation: I have reviewed the above documentation for accuracy and completeness, and I agree with the above.  Armida Sans, MD

## 2022-11-04 NOTE — Patient Instructions (Addendum)
Actinic keratoses are precancerous spots that appear secondary to cumulative UV radiation exposure/sun exposure over time. They are chronic with expected duration over 1 year. A portion of actinic keratoses will progress to squamous cell carcinoma of the skin. It is not possible to reliably predict which spots will progress to skin cancer and so treatment is recommended to prevent development of skin cancer.  Recommend daily broad spectrum sunscreen SPF 30+ to sun-exposed areas, reapply every 2 hours as needed.  Recommend staying in the shade or wearing long sleeves, sun glasses (UVA+UVB protection) and wide brim hats (4-inch brim around the entire circumference of the hat). Call for new or changing lesions.   Cryotherapy Aftercare  Wash gently with soap and water everyday.   Apply Vaseline and Band-Aid daily until healed.   Seborrheic Keratosis  What causes seborrheic keratoses? Seborrheic keratoses are harmless, common skin growths that first appear during adult life.  As time goes by, more growths appear.  Some people may develop a large number of them.  Seborrheic keratoses appear on both covered and uncovered body parts.  They are not caused by sunlight.  The tendency to develop seborrheic keratoses can be inherited.  They vary in color from skin-colored to gray, brown, or even black.  They can be either smooth or have a rough, warty surface.   Seborrheic keratoses are superficial and look as if they were stuck on the skin.  Under the microscope this type of keratosis looks like layers upon layers of skin.  That is why at times the top layer may seem to fall off, but the rest of the growth remains and re-grows.    Treatment Seborrheic keratoses do not need to be treated, but can easily be removed in the office.  Seborrheic keratoses often cause symptoms when they rub on clothing or jewelry.  Lesions can be in the way of shaving.  If they become inflamed, they can cause itching, soreness, or  burning.  Removal of a seborrheic keratosis can be accomplished by freezing, burning, or surgery. If any spot bleeds, scabs, or grows rapidly, please return to have it checked, as these can be an indication of a skin cancer.          Due to recent changes in healthcare laws, you may see results of your pathology and/or laboratory studies on MyChart before the doctors have had a chance to review them. We understand that in some cases there may be results that are confusing or concerning to you. Please understand that not all results are received at the same time and often the doctors may need to interpret multiple results in order to provide you with the best plan of care or course of treatment. Therefore, we ask that you please give us 2 business days to thoroughly review all your results before contacting the office for clarification. Should we see a critical lab result, you will be contacted sooner.   If You Need Anything After Your Visit  If you have any questions or concerns for your doctor, please call our main line at [phone] and press option 4 to reach your doctor's medical assistant. If no one answers, please leave a voicemail as directed and we will return your call as soon as possible. Messages left after 4 pm will be answered the following business day.   You may also send us a message via MyChart. We typically respond to MyChart messages within 1-2 business days.  For prescription refills, please ask your pharmacy   to contact our office. Our fax number is [phone].  If you have an urgent issue when the clinic is closed that cannot wait until the next business day, you can page your doctor at the number below.    Please note that while we do our best to be available for urgent issues outside of office hours, we are not available 24/7.   If you have an urgent issue and are unable to reach us, you may choose to seek medical care at your doctor's office, retail clinic,  urgent care center, or emergency room.  If you have a medical emergency, please immediately call 911 or go to the emergency department.  Pager Numbers  - Dr. Kowalski: [phone]  - Dr. Moye: [phone]  - Dr. Stewart: [phone]  In the event of inclement weather, please call our main line at [phone] for an update on the status of any delays or closures.  Dermatology Medication Tips: Please keep the boxes that topical medications come in in order to help keep track of the instructions about where and how to use these. Pharmacies typically print the medication instructions only on the boxes and not directly on the medication tubes.   If your medication is too expensive, please contact our office at [phone] option 4 or send us a message through MyChart.   We are unable to tell what your co-pay for medications will be in advance as this is different depending on your insurance coverage. However, we may be able to find a substitute medication at lower cost or fill out paperwork to get insurance to cover a needed medication.   If a prior authorization is required to get your medication covered by your insurance company, please allow us 1-2 business days to complete this process.  Drug prices often vary depending on where the prescription is filled and some pharmacies may offer cheaper prices.  The website www.goodrx.com contains coupons for medications through different pharmacies. The prices here do not account for what the cost may be with help from insurance (it may be cheaper with your insurance), but the website can give you the price if you did not use any insurance.  - You can print the associated coupon and take it with your prescription to the pharmacy.  - You may also stop by our office during regular business hours and pick up a GoodRx coupon card.  - If you need your prescription sent electronically to a different pharmacy, notify our office through [facility]  MyChart or by phone at [phone] option 4.     Si Usted Necesita Algo Despus de Su Visita  Tambin puede enviarnos un mensaje a travs de MyChart. Por lo general respondemos a los mensajes de MyChart en el transcurso de 1 a 2 das hbiles.  Para renovar recetas, por favor pida a su farmacia que se ponga en contacto con nuestra oficina. Nuestro nmero de fax es el [phone].  Si tiene un asunto urgente cuando la clnica est cerrada y que no puede esperar hasta el siguiente da hbil, puede llamar/localizar a su doctor(a) al nmero que aparece a continuacin.   Por favor, tenga en cuenta que aunque hacemos todo lo posible para estar disponibles para asuntos urgentes fuera del horario de oficina, no estamos disponibles las 24 horas del da, los 7 das de la semana.   Si tiene un problema urgente y no puede comunicarse con nosotros, puede optar por buscar atencin mdica  en el consultorio de su doctor(a), en una   clnica privada, en un centro de atencin urgente o en una sala de emergencias.  Si tiene una emergencia mdica, por favor llame inmediatamente al 911 o vaya a la sala de emergencias.  Nmeros de bper  - Dr. Kowalski: [phone]  - Dra. Moye: [phone]  - Dra. Stewart: [phone]  En caso de inclemencias del tiempo, por favor llame a nuestra lnea principal al [phone] para una actualizacin sobre el estado de cualquier retraso o cierre.  Consejos para la medicacin en dermatologa: Por favor, guarde las cajas en las que vienen los medicamentos de uso tpico para ayudarle a seguir las instrucciones sobre dnde y cmo usarlos. Las farmacias generalmente imprimen las instrucciones del medicamento slo en las cajas y no directamente en los tubos del medicamento.   Si su medicamento es muy caro, por favor, pngase en contacto con nuestra oficina llamando al [phone] y presione la opcin 4 o envenos un mensaje a travs de MyChart.   No podemos decirle cul  ser su copago por los medicamentos por adelantado ya que esto es diferente dependiendo de la cobertura de su seguro. Sin embargo, es posible que podamos encontrar un medicamento sustituto a menor costo o llenar un formulario para que el seguro cubra el medicamento que se considera necesario.   Si se requiere una autorizacin previa para que su compaa de seguros cubra su medicamento, por favor permtanos de 1 a 2 das hbiles para completar este proceso.  Los precios de los medicamentos varan con frecuencia dependiendo del lugar de dnde se surte la receta y alguna farmacias pueden ofrecer precios ms baratos.  El sitio web www.goodrx.com tiene cupones para medicamentos de diferentes farmacias. Los precios aqu no tienen en cuenta lo que podra costar con la ayuda del seguro (puede ser ms barato con su seguro), pero el sitio web puede darle el precio si no utiliz ningn seguro.  - Puede imprimir el cupn correspondiente y llevarlo con su receta a la farmacia.  - Tambin puede pasar por nuestra oficina durante el horario de atencin regular y recoger una tarjeta de cupones de GoodRx.  - Si necesita que su receta se enve electrnicamente a una farmacia diferente, informe a nuestra oficina a travs de MyChart de [facility] o por telfono llamando al [phone] y presione la opcin 4.  

## 2022-11-08 ENCOUNTER — Encounter: Payer: Self-pay | Admitting: Dermatology

## 2022-11-17 ENCOUNTER — Other Ambulatory Visit: Payer: Self-pay | Admitting: Family Medicine

## 2022-11-18 ENCOUNTER — Other Ambulatory Visit: Payer: Self-pay | Admitting: Family Medicine

## 2022-11-18 DIAGNOSIS — Z170 Estrogen receptor positive status [ER+]: Secondary | ICD-10-CM

## 2022-11-18 NOTE — Telephone Encounter (Signed)
Refill request for ALPRAZOLAM 0.5MG  TABLET   LOV - 09/22/22 Next OV - 12/24/22 Last refill - 10/19/22 #45/1

## 2022-11-26 ENCOUNTER — Encounter (INDEPENDENT_AMBULATORY_CARE_PROVIDER_SITE_OTHER): Payer: Self-pay

## 2022-11-26 ENCOUNTER — Ambulatory Visit (INDEPENDENT_AMBULATORY_CARE_PROVIDER_SITE_OTHER): Payer: Medicare HMO | Admitting: Clinical

## 2022-11-26 DIAGNOSIS — F4323 Adjustment disorder with mixed anxiety and depressed mood: Secondary | ICD-10-CM | POA: Diagnosis not present

## 2022-11-26 NOTE — Progress Notes (Signed)
[facility] Counselor/Therapist Progress Note  Patient ID: Jamie Curry, MRN: [id number]    Date: 11/26/22  Time Spent: 9:38  am - 10:31 am : 53 Minutes  Treatment Type: Individual Therapy.  Reported Symptoms: Patient reported obsessive thoughts and compulsions prior to leaving her home  Mental Status Exam: Appearance:  Neat and Well Groomed     Behavior: Appropriate  Motor: Normal  Speech/Language:  Clear and Coherent  Affect: Appropriate  Mood: normal  Thought process: normal  Thought content:   WNL  Sensory/Perceptual disturbances:   WNL  Orientation: oriented to person, place, and situation  Attention: Good  Concentration: Good  Memory: WNL  Fund of knowledge:  Good  Insight:   Good  Judgment:  Good  Impulse Control: Good   Risk Assessment: Danger to Self:  No Patient denied current suicidal ideation  Self-injurious Behavior: No Danger to Others: No Patient denied current homicidal ideation Duty to Warn:no Physical Aggression / Violence:No  Access to Firearms a concern: No  Gang Involvement:No   Subjective:  Patient reported she was recently notified that her ex-husband passed away. Patient stated, "that kind of got me" in response to ex-husband's death. Patient stated, "I've been doing pretty good" in response to patient's mood since last session. Patient stated, "I think I'm getting better about things". Patient reported her brother in law recently took patient's car to have the air conditioning repaired. Patient reported while her car was being repaired she experienced increased worry. Patient reported she has been attending a local church and feels attendance has been beneficial to her mood. Patient reported feeling "that's just a little better" in regards to feelings of loneliness. Patient reported she would like to see a decrease in feelings of loneliness.   Interventions: Motivational Interviewing and supportive therapy . Clinician conducted  session in person at clinician's office at [facility]. Provided supportive therapy, active listening, and validation as patient discussed recent loss and patient's response. Assessed patient's mood. Clinician utilized motivational interviewing to explore potential goals for therapy. Clinician utilized a task centered approach in collaboration with patient to develop goals for therapy. Patient participated in development of goals and agreed to goals for therapy. Clinician requested for homework patient complete a thought record.   Collaboration of Care: Primary Care Provider AEB Discussed consent for patient's PCP, Dr. Crawford Givens.  Diagnosis:  Adjustment disorder with mixed anxiety and depressed mood  R/O Major Depressive Disorder, Obsessive-Compulsive Disorder, and Generalized Anxiety Disorder  Plan: Patient is to utilize Cognitive Behavioral Therapy, thought re-framing, relaxation techniques, behavioral activation, and coping strategies to decrease symptoms associated with their diagnosis. Frequency: bi-weekly  Modality: individual     Long-term goal:   Increase participation in social opportunities from 1 day per week to 6-7 days per week Target Date: 11/26/23  Progress: 0   Short-term goal:  Reduce overall level, frequency, and intensity of the feelings of depression and anxiety as evidenced by decreased feelings of loneliness, guilt, depressed mood, loss of interest, difficulty falling asleep and staying asleep, decreased appetite, easily distracted, irritability, and worry from 6 to 7 days/week to 0 days/week per patient report for at least 3 consecutive months. Target Date: 05/29/23  Progress: 0   Reduce frequency and intensity of compulsions to check the stove, coffee pot, and water to ensure they are off and check door knob to ensure door is locked prior to patient leaving her home Target Date: 05/29/23  Progress: 0   Develop and verbalize an  understanding of the role  that distorted thinking plays in creating fears, excessive worry, and obsessive thoughts. Target Date: 05/29/23  Progress: 0   Develop and implement healthy coping strategies to utilize in response to stressors Target Date: 05/29/23  Progress: 0                    Doree Barthel, LCSW

## 2022-12-08 ENCOUNTER — Ambulatory Visit (INDEPENDENT_AMBULATORY_CARE_PROVIDER_SITE_OTHER): Payer: Medicare HMO | Admitting: Clinical

## 2022-12-08 DIAGNOSIS — F4323 Adjustment disorder with mixed anxiety and depressed mood: Secondary | ICD-10-CM

## 2022-12-08 NOTE — Progress Notes (Signed)
                Karen Sharpe, LCSW 

## 2022-12-08 NOTE — Progress Notes (Signed)
[facility] Counselor/Therapist Progress Note  Patient ID: SAYWARD FOUTZ, MRN: [id number],    Date: 12/08/2022  Time Spent: 9:38am - 10:22am : 44 minutes   Treatment Type: Individual Therapy  Reported Symptoms: Patient reported depressed mood in the afternoons/evenings.   Mental Status Exam: Appearance:  Neat and Well Groomed     Behavior: Appropriate  Motor: Normal  Speech/Language:  Clear and Coherent  Affect: Appropriate  Mood: normal  Thought process: normal  Thought content:   WNL  Sensory/Perceptual disturbances:   WNL  Orientation: oriented to person, place, and situation  Attention: Good  Concentration: Good  Memory: WNL  Fund of knowledge:  Good  Insight:   Good  Judgment:  Good  Impulse Control: Good   Risk Assessment: Danger to Self:  No Patient denied current suicidal ideation  Self-injurious Behavior: No Danger to Others: No Patient denied current homicidal ideation Duty to Warn:no Physical Aggression / Violence:No  Access to Firearms a concern: No  Gang Involvement:No   Subjective: Patient stated, "I think everything's going ok" in response to events since last session.  Patient reported she has been attending church and after a recent service several individuals from her church asked patient to join them for lunch. Patient reported she enjoyed going to lunch with the individuals from her church. Patient stated, "that's when the depression starts slipping in" in response to patient's thought record entry and stated "late evening is kind of depressing". Patient stated, "its been pretty good" in response to patient's mood today. Patient reported she received a scam phone call and reported the caller told patient the sheriff's department would be coming to her home. Patient reported the call was a trigger for an increase in anxiety. Patient reported she has not been checking items as frequently and reported she is trying to decrease the compulsions to  1-2 compulsions. Patient identified a lack of activity in the afternoon/evenings as a trigger for depressed mood.   Interventions: Cognitive Behavioral Therapy. Clinician conducted session in person at clinician's office at [facility]. Reviewed events since last session. Assessed patient's mood since last session and assessed patient's current mood. Discussed the frequency of compulsions and recent changes in frequency. Reviewed patients thought record. Assisted patient in exploring and identifying triggers/thoughts associated with patient's thought record entries and depressed mood in the afternoons/evenings. Provided psycho education related to depression. Clinician requested for homework patient continue thought record.    Collaboration of Care: not required at this time   Diagnosis:  Adjustment disorder with mixed anxiety and depressed mood   R/O Major Depressive Disorder, Obsessive-Compulsive Disorder, and Generalized Anxiety Disorder   Plan: Patient is to utilize [employer] [facility], thought re-framing, relaxation techniques, behavioral activation, and coping strategies to decrease symptoms associated with their diagnosis. Frequency: bi-weekly  Modality: individual      Long-term goal:   Increase participation in social opportunities from 1 day per week to 6-7 days per week Target Date: 11/26/23  Progress: progressing    Short-term goal:  Reduce overall level, frequency, and intensity of the feelings of depression and anxiety as evidenced by decreased feelings of loneliness, guilt, depressed mood, loss of interest, difficulty falling asleep and staying asleep, decreased appetite, easily distracted, irritability, and worry from 6 to 7 days/week to 0 days/week per patient report for at least 3 consecutive months. Target Date: 05/29/23  Progress: progressing    Reduce frequency and intensity of compulsions to check the stove, coffee pot, and water to ensure  they are off  and check door knob to ensure door is locked prior to patient leaving her home Target Date: 05/29/23  Progress: progressing    Develop and verbalize an understanding of the role that distorted thinking plays in creating fears, excessive worry, and obsessive thoughts. Target Date: 05/29/23  Progress: progressing    Develop and implement healthy coping strategies to utilize in response to stressors Target Date: 05/29/23  Progress: progressing                                Doree Barthel, LCSW

## 2022-12-15 ENCOUNTER — Other Ambulatory Visit: Payer: Self-pay | Admitting: Family Medicine

## 2022-12-15 DIAGNOSIS — I10 Essential (primary) hypertension: Secondary | ICD-10-CM

## 2022-12-16 ENCOUNTER — Ambulatory Visit: Payer: Medicare HMO

## 2022-12-16 VITALS — Ht 64.0 in | Wt 180.0 lb

## 2022-12-16 DIAGNOSIS — Z0000 Encounter for general adult medical examination without abnormal findings: Secondary | ICD-10-CM

## 2022-12-16 DIAGNOSIS — Z780 Asymptomatic menopausal state: Secondary | ICD-10-CM | POA: Diagnosis not present

## 2022-12-16 NOTE — Progress Notes (Signed)
Subjective:   Jamie Curry is a 77 y.o. female who presents for Medicare Annual (Subsequent) preventive examination.  Visit Complete: Virtual  I connected with  Jamie Curry on 12/16/22 by a audio enabled telemedicine application and verified that I am speaking with the correct person using two identifiers.  Patient Location: Home  Provider Location: Office/Clinic  I discussed the limitations of evaluation and management by telemedicine. The patient expressed understanding and agreed to proceed.  Vital Signs: Because this visit was a virtual/telehealth visit, some criteria may be missing or patient reported. Any vitals not documented were not able to be obtained and vitals that have been documented are patient reported.    Review of Systems      Cardiac Risk Factors include: advanced age (>19men, >86 women);hypertension;sedentary lifestyle;dyslipidemia     Objective:    Today's Vitals   12/16/22 0942  Weight: 180 lb (81.6 kg)  Height: 5\' 4"  (1.626 m)   Body mass index is 30.9 kg/m.     12/16/2022    9:49 AM 07/08/2022   10:27 AM 06/03/2022   11:21 AM 12/12/2021    8:52 AM 07/07/2021   11:15 AM 01/06/2021   10:41 AM 06/07/2020   10:01 AM  Advanced Directives  Does Patient Have a Medical Advance Directive? No Yes No No No No No  Type of Furniture conservator/restorer;Living will       Copy of Healthcare Power of Attorney in Chart?      No - copy requested   Would patient like information on creating a medical advance directive? No - Patient declined  Yes (MAU/Ambulatory/Procedural Areas - Information given) No - Patient declined No - Patient declined      Current Medications (verified) Outpatient Encounter Medications as of 12/16/2022  Medication Sig   acetaminophen (TYLENOL) 500 MG tablet Take 1,000 mg by mouth every 6 (six) hours as needed for mild pain.   albuterol (VENTOLIN HFA) 108 (90 Base) MCG/ACT inhaler Inhale into the lungs every 6 (six) hours as  needed for wheezing or shortness of breath.   ALPRAZolam (XANAX) 0.5 MG tablet TAKE ONE HALF (1/2) TABLET BY MOUTH IN THE MORNING AND ONE TABLET IN THE EVENING IF NEEDED. SEDATION CAUTION   amLODipine (NORVASC) 2.5 MG tablet Take 1 tablet (2.5 mg total) by mouth daily.   diclofenac (VOLTAREN) 50 MG EC tablet TAKE ONE TABLET BY MOUTH ONCE A DAY AS NEEDED WITH FOOD.   escitalopram (LEXAPRO) 20 MG tablet TAKE ONE TABLET BY MOUTH ONCE A DAY   fluticasone (FLONASE) 50 MCG/ACT nasal spray Place into both nostrils.   hydrochlorothiazide (HYDRODIURIL) 25 MG tablet TAKE ONE TABLET BY MOUTH ONCE A DAY   loperamide (IMODIUM A-D) 2 MG tablet Take 1 tablet (2 mg total) by mouth 4 (four) times daily as needed for diarrhea or loose stools.   melatonin 5 MG TABS Take 1 tablet (5 mg total) by mouth at bedtime as needed.   montelukast (SINGULAIR) 10 MG tablet TAKE ONE TABLET BY MOUTH ONCE A DAY   nystatin powder APPLY 1 APPLICATION TOPICALLY 3 TIMES DAILY   rosuvastatin (CRESTOR) 10 MG tablet TAKE ONE TABLET BY MOUTH EVERY NIGHT AT BEDTIME   No facility-administered encounter medications on file as of 12/16/2022.    Allergies (verified) Penicillins, Zithromax [azithromycin], Anastrozole, Cefuroxime axetil, Codeine, Erythromycin, Nitrofurantoin, Ofloxacin, Prednisone, Propoxyphene, Tetracycline, and Sulfa antibiotics   History: Past Medical History:  Diagnosis Date   Anxiety  Asthma    Breast cancer (HCC) 02/07/2016   left breast invasive lobular carcinoma   CAD (coronary artery disease)    mild CAD by cath in 2001   Depression    Diverticulosis    Heart murmur    HTN (hypertension)    Hyperlipidemia    Palpitations    Personal history of radiation therapy 2018   left breast ca   Squamous cell carcinoma of skin 12/09/2017   R lat base of neck, SCCIS   Syncope    Pre-syncope   Past Surgical History:  Procedure Laterality Date   ABDOMINAL HYSTERECTOMY     BREAST BIOPSY Left 02/07/2016   grade  II invasive and in situ mammary carcinoma   BREAST CYST ASPIRATION Left 02/13/2016   cyst   BREAST LUMPECTOMY Left 03/03/2016   invasive lobular carcinoma, clear margins, METASTATIC LOBULAR CARCINOMA, 2.5 MM, IN ONE LYMPH NODE (1/1).    CHOLECYSTECTOMY     KNEE ARTHROSCOPY Right    PARTIAL MASTECTOMY WITH NEEDLE LOCALIZATION Left 03/03/2016   Procedure: PARTIAL MASTECTOMY WITH NEEDLE LOCALIZATION;  Surgeon: Nadeen Landau, MD;  Location: [facility];  Service: General;  Laterality: Left;   SENTINEL NODE BIOPSY Left 03/03/2016   Procedure: SENTINEL NODE BIOPSY;  Surgeon: Nadeen Landau, MD;  Location: [facility];  Service: General;  Laterality: Left;   TUBAL LIGATION     Family History  Problem Relation Age of Onset   Cancer Father        oral cancer   Cancer Sister        colon cancer and lymphoma   Colon cancer Sister    Breast cancer Neg Hx    Social History   Socioeconomic History   Marital status: Divorced    Spouse name: Not on file   Number of children: Not on file   Years of education: Not on file   Highest education level: Not on file  Occupational History   Not on file  Tobacco Use   Smoking status: Never   Smokeless tobacco: Never  Substance and Sexual Activity   Alcohol use: No    Alcohol/week: 0.0 standard drinks of alcohol   Drug use: No   Sexual activity: Not on file  Other Topics Concern   Not on file  Social History Narrative   Lives alone with her dog.  Divorced.     No kids    Prev worked assembly work.  Retired at age 89.    Social Determinants of Health   Financial Resource Strain: Low Risk  (12/16/2022)   Overall Financial Resource Strain (CARDIA)    Difficulty of Paying Living Expenses: Not hard at all  Food Insecurity: No Food Insecurity (12/16/2022)   Hunger Vital Sign    Worried About Running Out of Food in the Last Year: Never true    Ran Out of Food in the Last Year: Never true  Transportation Needs: No Transportation Needs  (12/16/2022)   PRAPARE - Administrator, Civil Service (Medical): No    Lack of Transportation (Non-Medical): No  Physical Activity: Insufficiently Active (12/16/2022)   Exercise Vital Sign    Days of Exercise per Week: 2 days    Minutes of Exercise per Session: 30 min  Stress: No Stress Concern Present (12/16/2022)   [employer] of Occupational Health - Occupational Stress Questionnaire    Feeling of Stress : Not at all  Social Connections: Moderately Isolated (12/16/2022)   Social Connection and Isolation  Panel [NHANES]    Frequency of Communication with Friends and Family: More than three times a week    Frequency of Social Gatherings with Friends and Family: More than three times a week    Attends Religious Services: More than 4 times per year    Active Member of [employer] or Organizations: No    Attends Engineer, structural: Never    Marital Status: Divorced    Tobacco Counseling Counseling given: Not Answered   Clinical Intake:  Pre-visit preparation completed: Yes  Pain : No/denies pain     BMI - recorded: 30.9 Nutritional Status: BMI > 30  Obese Nutritional Risks: None Diabetes: No  How often do you need to have someone help you when you read instructions, pamphlets, or other written materials from your doctor or pharmacy?: 1 - Never  Interpreter Needed?: No  Information entered by :: C.Lashona Schaaf LPN   Activities of Daily Living    12/16/2022    9:50 AM  In your present state of health, do you have any difficulty performing the following activities:  Hearing? 0  Vision? 1  Comment Needs new rx has exam schedule  Difficulty concentrating or making decisions? 0  Walking or climbing stairs? 0  Dressing or bathing? 0  Doing errands, shopping? 0  Preparing Food and eating ? N  Using the Toilet? N  In the past six months, have you accidently leaked urine? N  Do you have problems with loss of bowel control? N  Managing your Medications? N   Managing your Finances? N  Housekeeping or managing your Housekeeping? N    Patient Care Team: Joaquim Nam, MD as PCP - General (Family Medicine) Kathyrn Sheriff, [facility] (Inactive) as Pharmacist (Pharmacist)  Indicate any recent Medical Services you may have received from other than [facility] providers in the past year (date may be approximate).     Assessment:   This is a routine wellness examination for Jamara.  Hearing/Vision screen Hearing Screening - Comments:: Denies hearing difficulties   Vision Screening - Comments:: Glasses - Feels like needs a new prescription - Dr.Bell -UTD on eye exams  Dietary issues and exercise activities discussed:     Goals Addressed             This Visit's Progress    Patient Stated       Socialize more, increase exercise.       Depression Screen    12/16/2022    9:44 AM 09/22/2022    9:37 AM 09/16/2022    2:44 PM 07/23/2022    1:53 PM 05/14/2022   12:26 PM 05/07/2022   10:50 AM 12/12/2021    8:49 AM  PHQ 2/9 Scores  PHQ - 2 Score 0 0 0 1 1 2 1   PHQ- 9 Score  1 1 5 4 6 3     Fall Risk    12/16/2022    9:50 AM 09/22/2022    9:37 AM 09/16/2022    2:43 PM 07/23/2022    1:53 PM 05/14/2022   12:26 PM  Fall Risk   Falls in the past year? 0 0 0 0 0  Number falls in past yr: 0 0 0 0 0  Injury with Fall? 0 0 0 0 0  Risk for fall due to : No Fall Risks No Fall Risks No Fall Risks No Fall Risks No Fall Risks  Follow up Falls prevention discussed;Falls evaluation completed Falls evaluation completed Falls evaluation completed Falls evaluation completed Falls  evaluation completed    MEDICARE RISK AT HOME: Medicare Risk at Home Any stairs in or around the home?: No If so, are there any without handrails?: No Home free of loose throw rugs in walkways, pet beds, electrical cords, etc?: Yes Adequate lighting in your home to reduce risk of falls?: Yes Life alert?: No Use of a cane, walker or w/c?: No Grab bars in the bathroom?: Yes Shower  chair or bench in shower?: No Elevated toilet seat or a handicapped toilet?: No  TIMED UP AND GO:  Was the test performed?  No    Cognitive Function:        12/16/2022    9:52 AM 12/12/2021    8:54 AM  6CIT Screen  What Year? 0 points 0 points  What month? 0 points 0 points  What time? 0 points 0 points  Count back from 20 0 points 0 points  Months in reverse 0 points 0 points  Repeat phrase 2 points 2 points  Total Score 2 points 2 points    Immunizations Immunization History  Administered Date(s) Administered   Fluad Quad(high Dose 65+) 12/28/2018   Influenza Split 12/19/2009, 12/29/2010, 01/06/2012, 01/16/2013, 01/25/2014   Influenza, High Dose Seasonal PF 01/07/2016, 01/06/2018, 12/28/2018, 01/15/2022   Influenza,inj,Quad PF,6+ Mos 01/16/2013, 01/25/2014, 01/03/2015, 01/12/2017   Influenza-Unspecified 01/01/2016   PFIZER(Purple Top)SARS-COV-2 Vaccination 09/16/2019, 10/07/2019   Pneumococcal Conjugate-13 07/25/2014   Pneumococcal-Unspecified 12/19/2009, 12/29/2010, 01/16/2013   Tdap 12/13/2008   Zoster Recombinant(Shingrix) 01/26/2017, 04/27/2017   Zoster, Live 12/19/2009, 04/27/2017    TDAP status: Due, Education has been provided regarding the importance of this vaccine. Advised may receive this vaccine at local pharmacy or Health Dept. Aware to provide a copy of the vaccination record if obtained from local pharmacy or Health Dept. Verbalized acceptance and understanding.  Flu Vaccine status: Due, Education has been provided regarding the importance of this vaccine. Advised may receive this vaccine at local pharmacy or Health Dept. Aware to provide a copy of the vaccination record if obtained from local pharmacy or Health Dept. Verbalized acceptance and understanding.  Pneumococcal vaccine status: Due, Education has been provided regarding the importance of this vaccine. Advised may receive this vaccine at local pharmacy or Health Dept. Aware to provide a copy of the  vaccination record if obtained from local pharmacy or Health Dept. Verbalized acceptance and understanding.  Covid-19 vaccine status: Declined, Education has been provided regarding the importance of this vaccine but patient still declined. Advised may receive this vaccine at local pharmacy or Health Dept.or vaccine clinic. Aware to provide a copy of the vaccination record if obtained from local pharmacy or Health Dept. Verbalized acceptance and understanding.  Qualifies for Shingles Vaccine? Yes   Zostavax completed Yes   Shingrix Completed?: Yes  Screening Tests Health Maintenance  Topic Date Due   Hepatitis C Screening  Never done   Pneumonia Vaccine 50+ Years old (2 of 2 - PPSV23 or PCV20) 07/25/2015   DTaP/Tdap/Td (2 - Td or Tdap) 12/14/2018   COVID-19 Vaccine (3 - Pfizer risk series) 11/04/2019   INFLUENZA VACCINE  11/12/2022   Medicare Annual Wellness (AWV)  12/16/2023   DEXA SCAN  Completed   Zoster Vaccines- Shingrix  Completed   HPV VACCINES  Aged Out    Health Maintenance  Health Maintenance Due  Topic Date Due   Hepatitis C Screening  Never done   Pneumonia Vaccine 46+ Years old (2 of 2 - PPSV23 or PCV20) 07/25/2015   DTaP/Tdap/Td (2 -  Td or Tdap) 12/14/2018   COVID-19 Vaccine (3 - Pfizer risk series) 11/04/2019   INFLUENZA VACCINE  11/12/2022    Colorectal cancer screening: No longer required.   Mammogram status: Completed 03/17/22. Repeat every year  Bone Density status: Ordered 12/16/22. Pt provided with contact info and advised to call to schedule appt.  Lung Cancer Screening: (Low Dose CT Chest recommended if Age 69-80 years, 20 pack-year currently smoking OR have quit w/in 15years.) does not qualify.   Lung Cancer Screening Referral:    Additional Screening:  Hepatitis C Screening: does qualify; Completed DUE  Vision Screening: Recommended annual ophthalmology exams for early detection of glaucoma and other disorders of the eye. Is the patient up to  date with their annual eye exam?  Yes  Who is the provider or what is the name of the office in which the patient attends annual eye exams? Dr.Bell If pt is not established with a provider, would they like to be referred to a provider to establish care? Yes .   Dental Screening: Recommended annual dental exams for proper oral hygiene  Diabetic Foot Exam:   Community Resource Referral / Chronic Care Management: CRR required this visit?  No   CCM required this visit?  No     Plan:     I have personally reviewed and noted the following in the patient's chart:   Medical and social history Use of alcohol, tobacco or illicit drugs  Current medications and supplements including opioid prescriptions. Patient is not currently taking opioid prescriptions. Functional ability and status Nutritional status Physical activity Advanced directives List of other physicians Hospitalizations, surgeries, and ER visits in previous 12 months Vitals Screenings to include cognitive, depression, and falls Referrals and appointments  In addition, I have reviewed and discussed with patient certain preventive protocols, quality metrics, and best practice recommendations. A written personalized care plan for preventive services as well as general preventive health recommendations were provided to patient.     Maryan Puls, LPN   12/17/2839   After Visit Summary: (MyChart) Due to this being a telephonic visit, the after visit summary with patients personalized plan was offered to patient via MyChart   Nurse Notes: None

## 2022-12-16 NOTE — Patient Instructions (Addendum)
Jamie Curry , Thank you for taking time to come for your Medicare Wellness Visit. I appreciate your ongoing commitment to your health goals. Please review the following plan we discussed and let me know if I can assist you in the future.   Referrals/Orders/Follow-Ups/Clinician Recommendations: Aim for 30 minutes of exercise or brisk walking, 6-8 glasses of water, and 5 servings of fruits and vegetables each day.   You have an order for:  []   2D Mammogram  [x]   3D Mammogram  []   Bone Density     Please call for appointment:  [facility]  [address] [place] [address] [phone]  [facility] [address] [place], [place] [address] [phone]  [facility] [address] [place], [place] [address] [phone]    Make sure to wear two-piece clothing.  No lotions, powders, or deodorants the day of the appointment. Make sure to bring picture ID and insurance card.  Bring list of medications you are currently taking including any supplements.   Schedule your [place] screening mammogram through MyChart!   Log into your MyChart account.  Go to 'Visit' (or 'Appointments' if on mobile App) --> Schedule an Appointment  Under 'Select a Reason for Visit' choose the Mammogram Screening option.  Complete the pre-visit questions and select the time and place that best fits your schedule.    This is a list of the screening recommended for you and due dates:  Health Maintenance  Topic Date Due   Hepatitis C Screening  Never done   Pneumonia Vaccine (2 of 2 - PPSV23 or PCV20) 07/25/2015   DTaP/Tdap/Td vaccine (2 - Td or Tdap) 12/14/2018   COVID-19 Vaccine (3 - Pfizer risk series) 11/04/2019   Flu Shot  11/12/2022   Medicare Annual Wellness Visit  12/13/2022   DEXA scan (bone density measurement)  Completed   Zoster (Shingles) Vaccine   Completed   HPV Vaccine  Aged Out    Advanced directives: (Declined) Advance directive discussed with you today. Even though you declined this today, please call our office should you change your mind, and we can give you the proper paperwork for you to fill out.  Next Medicare Annual Wellness Visit scheduled for next year: Yes

## 2022-12-18 ENCOUNTER — Other Ambulatory Visit (INDEPENDENT_AMBULATORY_CARE_PROVIDER_SITE_OTHER): Payer: Medicare HMO

## 2022-12-18 DIAGNOSIS — I10 Essential (primary) hypertension: Secondary | ICD-10-CM | POA: Diagnosis not present

## 2022-12-18 LAB — COMPREHENSIVE METABOLIC PANEL
ALT: 15 U/L (ref 0–35)
AST: 19 U/L (ref 0–37)
Albumin: 4.2 g/dL (ref 3.5–5.2)
Alkaline Phosphatase: 78 U/L (ref 39–117)
BUN: 21 mg/dL (ref 6–23)
CO2: 30 meq/L (ref 19–32)
Calcium: 9.3 mg/dL (ref 8.4–10.5)
Chloride: 99 meq/L (ref 96–112)
Creatinine, Ser: 1.06 mg/dL (ref 0.40–1.20)
GFR: 50.57 mL/min — ABNORMAL LOW (ref >60.00)
Glucose, Bld: 92 mg/dL (ref 70–99)
Potassium: 3.7 meq/L (ref 3.5–5.1)
Sodium: 138 meq/L (ref 135–145)
Total Bilirubin: 1 mg/dL (ref 0.2–1.2)
Total Protein: 7 g/dL (ref 6.0–8.3)

## 2022-12-18 LAB — CBC WITH DIFFERENTIAL/PLATELET
Basophils Absolute: 0 10*3/uL (ref 0.0–0.1)
Basophils Relative: 1.2 % (ref 0.0–3.0)
Eosinophils Absolute: 0.1 10*3/uL (ref 0.0–0.7)
Eosinophils Relative: 3.6 % (ref 0.0–5.0)
HCT: 37.7 % (ref 36.0–46.0)
Hemoglobin: 12.1 g/dL (ref 12.0–15.0)
Lymphocytes Relative: 33.1 % (ref 12.0–46.0)
Lymphs Abs: 1.2 10*3/uL (ref 0.7–4.0)
MCHC: 32.2 g/dL (ref 30.0–36.0)
MCV: 92.8 fl (ref 78.0–100.0)
Monocytes Absolute: 0.4 10*3/uL (ref 0.1–1.0)
Monocytes Relative: 11.2 % (ref 3.0–12.0)
Neutro Abs: 1.8 10*3/uL (ref 1.4–7.7)
Neutrophils Relative %: 50.9 % (ref 43.0–77.0)
Platelets: 181 10*3/uL (ref 150.0–400.0)
RBC: 4.06 Mil/uL (ref 3.87–5.11)
RDW: 14.9 % (ref 11.5–15.5)
WBC: 3.6 10*3/uL — ABNORMAL LOW (ref 4.0–10.5)

## 2022-12-18 LAB — LIPID PANEL
Cholesterol: 166 mg/dL (ref 0–200)
HDL: 71.7 mg/dL (ref >39.00)
LDL Cholesterol: 62 mg/dL (ref 0–99)
NonHDL: 94.73
Total CHOL/HDL Ratio: 2
Triglycerides: 162 mg/dL — ABNORMAL HIGH (ref 0.0–149.0)
VLDL: 32.4 mg/dL (ref 0.0–40.0)

## 2022-12-18 LAB — TSH: TSH: 5.22 u[IU]/mL (ref 0.35–5.50)

## 2022-12-21 ENCOUNTER — Other Ambulatory Visit: Payer: Self-pay | Admitting: Family Medicine

## 2022-12-24 ENCOUNTER — Encounter: Payer: Self-pay | Admitting: Family Medicine

## 2022-12-24 ENCOUNTER — Ambulatory Visit (INDEPENDENT_AMBULATORY_CARE_PROVIDER_SITE_OTHER): Payer: Medicare HMO | Admitting: Family Medicine

## 2022-12-24 VITALS — BP 138/68 | HR 65 | Temp 97.3°F | Ht 62.5 in | Wt 188.0 lb

## 2022-12-24 DIAGNOSIS — Z1211 Encounter for screening for malignant neoplasm of colon: Secondary | ICD-10-CM

## 2022-12-24 DIAGNOSIS — F419 Anxiety disorder, unspecified: Secondary | ICD-10-CM | POA: Diagnosis not present

## 2022-12-24 DIAGNOSIS — Z0000 Encounter for general adult medical examination without abnormal findings: Secondary | ICD-10-CM

## 2022-12-24 DIAGNOSIS — Z7189 Other specified counseling: Secondary | ICD-10-CM | POA: Diagnosis not present

## 2022-12-24 DIAGNOSIS — E785 Hyperlipidemia, unspecified: Secondary | ICD-10-CM

## 2022-12-24 DIAGNOSIS — I10 Essential (primary) hypertension: Secondary | ICD-10-CM | POA: Diagnosis not present

## 2022-12-24 DIAGNOSIS — J45909 Unspecified asthma, uncomplicated: Secondary | ICD-10-CM | POA: Diagnosis not present

## 2022-12-24 MED ORDER — ROSUVASTATIN CALCIUM 10 MG PO TABS: 10.0000 mg | ORAL_TABLET | Freq: Every day | ORAL | 3 refills | Status: DC

## 2022-12-24 NOTE — Patient Instructions (Addendum)
You should get a call about seeing GI.   Tetanus may be cheaper at the pharmacy.   You can call for a bone density test with your mammogram at [facility] at [facility].  [address] [place] [address] [phone]  Flu shot when possible.

## 2022-12-24 NOTE — Progress Notes (Signed)
Hypertension:    Using medication without problems or lightheadedness: yes  Chest pain with exertion:no Edema: some BLE edema, not bothersome.   Short of breath: no BP controlled on home checks.    She is going to f/u with Dr. Wyn Quaker next month with vascular surgery  Elevated Cholesterol: Using medications without problems: yes Muscle aches: no Diet compliance: d/w pt.  Exercise: d/w pt.  Labs d/w pt.    Asthma.  Rare SABA with singulair use.  Compliant.  No wheeze.   Mood d/w pt.  Anxiety is better on lexapro, taking lexapro at night.  In counseling.  She had noticed a change in dreams, d/w pt about taking lexapro in the AM.  Using xanax as needed.  Mood tends to be lower in the late PMs.   Tetanus 2010 Flu to be done this fall.   PNA prev done.  Shingles prev done.  Covid d/w pt.   RSV vaccine d/w pt.   Mammogram due later 2024 DXA ordered prev.  D/w pt.   Colon cancer screening d/w pt.  Referred 2024. Living will d/w pt. Brother in law Rebecca Eaton designated if patient were incapacitated.    I asked her to try to clarify her allergy list, d/w pt.    Meds, vitals, and allergies reviewed.   PMH and SH reviewed  ROS: Per HPI unless specifically indicated in ROS section   GEN: nad, alert and oriented HEENT: ncat NECK: supple w/o LA CV: rrr. PULM: ctab, no inc wob ABD: soft, +bs EXT: trace almost no BLE edema SKIN: no acute rash L>R varicose veins.    30 minutes were devoted to patient care in this encounter (this includes time spent reviewing the patient's file/history, interviewing and examining the patient, counseling/reviewing plan with patient).

## 2022-12-27 DIAGNOSIS — Z0000 Encounter for general adult medical examination without abnormal findings: Secondary | ICD-10-CM | POA: Insufficient documentation

## 2022-12-27 NOTE — Assessment & Plan Note (Signed)
Tetanus 2010 Flu to be done this fall.   PNA prev done.  Shingles prev done.  Covid d/w pt.   RSV vaccine d/w pt.   Mammogram due later 2024 DXA ordered prev.  D/w pt.   Colon cancer screening d/w pt.  Referred 2024. Living will d/w pt. Brother in law Rebecca Eaton designated if patient were incapacitated.

## 2022-12-27 NOTE — Assessment & Plan Note (Signed)
Living will d/w pt. Brother in law Rebecca Eaton designated if patient were incapacitated.

## 2022-12-27 NOTE — Assessment & Plan Note (Signed)
Continue Crestor.  Continue work on diet and exercise. ?

## 2022-12-27 NOTE — Assessment & Plan Note (Signed)
Rare SABA with singulair use.  Compliant.  No wheeze.  Continue as is.

## 2022-12-27 NOTE — Assessment & Plan Note (Signed)
Anxiety is better on lexapro, taking lexapro at night.  In counseling.  She had noticed a change in dreams, d/w pt about taking lexapro in the AM.  Using xanax as needed.  Mood tends to be lower in the late PMs.  Would not yet change dose of Xanax to Lexapro yet.

## 2022-12-27 NOTE — Assessment & Plan Note (Signed)
Blood pressure controlled on home checks.  Continue amlodipine hydrochlorothiazide.

## 2022-12-29 ENCOUNTER — Ambulatory Visit (INDEPENDENT_AMBULATORY_CARE_PROVIDER_SITE_OTHER): Payer: Medicare HMO | Admitting: Clinical

## 2022-12-29 DIAGNOSIS — F4323 Adjustment disorder with mixed anxiety and depressed mood: Secondary | ICD-10-CM | POA: Diagnosis not present

## 2022-12-29 NOTE — Progress Notes (Signed)
[facility] Counselor/Therapist Progress Note  Patient ID: HAYLEY KIERSTEAD, MRN: [id number],    Date: 12/29/2022  Time Spent: 10:38am - 11:25am : 47 minutes   Treatment Type: Individual Therapy  Reported Symptoms: Patient reported depressed mood in the evenings and feelings of loneliness  Mental Status Exam: Appearance:  Neat and Well Groomed     Behavior: Appropriate  Motor: Normal  Speech/Language:  Clear and Coherent  Affect: Appropriate  Mood: normal  Thought process: normal  Thought content:   WNL  Sensory/Perceptual disturbances:   WNL  Orientation: oriented to person, place, and situation  Attention: Good  Concentration: Good  Memory: WNL  Fund of knowledge:  Good  Insight:   Good  Judgment:  Good  Impulse Control: Good   Risk Assessment: Danger to Self:  No Patient denied current suicidal ideation  Self-injurious Behavior: No Danger to Others: No Patient denied current homicidal ideation Duty to Warn:no Physical Aggression / Violence:No  Access to Firearms a concern: No  Gang Involvement:No   Subjective: Patient reported she recently had her physical and has several upcoming appointments for routine screenings. Patient reported she continues to experience depression in the evenings and reported feelings of loneliness. Patient reported a decrease in anxiety since last session. Patient reported she experiences sadness and cries when thinking about the loss of patient's dog, Sammy. Patient reported she had Sammy for almost 13 years and Sammy was patient's companion.  Patient reported patient's routine was based on Sammy's routine. Patient stated, "the loneliness seems to be worse". Patient reported she has three neighbors she can call when feeling lonely.  Patient stated, "for that time being, it clears mind" in regards to when patient is talking with neighbors. 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 Patient reported she enjoys playing  bingo. Patient reported she is fearful of traveling long distances from home and reported a fear of changing her routine.  Patient stated, "I'll start today" in response to homework assignment.   Interventions: Cognitive Behavioral Therapy and behavioral activation . Clinician conducted session in person at clinician's office at [facility]. Reviewed events since last session. Provided supportive therapy, active listening, and validation as patient discussed the loss of her dog, Sammy, and patient's response to the loss. Explored patient's support system. Provided psycho education related to grief. Explored potential activities to increase patient's activity level and decrease feelings of isolation in the evenings, such as, going to the [facility], attending events at local senior center. Provided psycho education related to building habits and breaking habits into smaller steps. Clinician requested for homework patient initiate the step of driving to the [facility] twice a week and continue thought record.    Collaboration of Care: not required at this time   Diagnosis:  Adjustment disorder with mixed anxiety and depressed mood   R/O Major Depressive Disorder, Obsessive-Compulsive Disorder, and Generalized Anxiety Disorder   Plan: Patient is to utilize [employer] Therapy, thought re-framing, relaxation techniques, behavioral activation, and coping strategies to decrease symptoms associated with their diagnosis. Frequency: bi-weekly  Modality: individual      Long-term goal:   Increase participation in social opportunities from 1 day per week to 6-7 days per week Target Date: 11/26/23  Progress: progressing    Short-term goal:  Reduce overall level, frequency, and intensity of the feelings of depression and anxiety as evidenced by decreased feelings of loneliness, guilt, depressed mood, loss of interest, difficulty falling asleep and staying asleep, decreased appetite, easily  distracted,  irritability, and worry from 6 to 7 days/week to 0 days/week per patient report for at least 3 consecutive months. Target Date: 05/29/23  Progress: progressing    Reduce frequency and intensity of compulsions to check the stove, coffee pot, and water to ensure they are off and check door knob to ensure door is locked prior to patient leaving her home Target Date: 05/29/23  Progress: progressing    Develop and verbalize an understanding of the role that distorted thinking plays in creating fears, excessive worry, and obsessive thoughts. Target Date: 05/29/23  Progress: progressing    Develop and implement healthy coping strategies to utilize in response to stressors Target Date: 05/29/23  Progress: progressing                Doree Barthel, LCSW

## 2022-12-29 NOTE — Progress Notes (Signed)
                Dezi Schaner, LCSW 

## 2023-01-04 DIAGNOSIS — H35033 Hypertensive retinopathy, bilateral: Secondary | ICD-10-CM | POA: Diagnosis not present

## 2023-01-04 DIAGNOSIS — H524 Presbyopia: Secondary | ICD-10-CM | POA: Diagnosis not present

## 2023-01-13 ENCOUNTER — Ambulatory Visit: Payer: Medicare HMO | Admitting: Clinical

## 2023-01-13 DIAGNOSIS — F4323 Adjustment disorder with mixed anxiety and depressed mood: Secondary | ICD-10-CM | POA: Diagnosis not present

## 2023-01-13 NOTE — Progress Notes (Signed)
[facility] Counselor/Therapist Progress Note  Patient ID: Jamie Curry, MRN: [id number],    Date: 01/13/2023  Time Spent: 10:37am - 11:26am : 49 minutes   Treatment Type: Individual Therapy  Reported Symptoms: Patient reported a decline in mood in the evenings.   Mental Status Exam: Appearance:  Neat and Well Groomed     Behavior: Appropriate  Motor: Normal  Speech/Language:  Clear and Coherent  Affect: Appropriate  Mood: normal  Thought process: normal  Thought content:   WNL  Sensory/Perceptual disturbances:   WNL  Orientation: oriented to person, place, and situation  Attention: Good  Concentration: Good  Memory: WNL  Fund of knowledge:  Good  Insight:   Good  Judgment:  Good  Impulse Control: Good   Risk Assessment: Danger to Self:  No Patient denied current suicidal ideation Self-injurious Behavior: No Danger to Others: No Patient denied current homicidal ideation Duty to Warn:no Physical Aggression / Violence:No  Access to Firearms a concern: No  Gang Involvement:No   Subjective: Patient stated, "I have my good days and bad days" in response to patient's mood since last session.  Patient reported she recently attended an appointment at the [facility] to tour the [facility] Patient stated, "I have been going out more" and reported she recently went out to eat with family, went to lunch with neighbors, and has been attending church. Patient reported feeling anxious prior to appointments. Patient stated, "you think the worst" in response to an upcoming appointment with the vein/vascular physician. Patient reported she thinks about her dog, Sammy, frequently. Patient stated, "getting out helps". Patient reported a decline in mood when patient is at home by herself in the evenings.  Patient reported a decrease in checking items prior to leaving her home. Patient stated, "I feel better" in response to patient's current mood. Patient stated, "at night I get down", "I get  tired of being by myself".   Interventions: Cognitive Behavioral Therapy. Clinician conducted session in person at clinician's office at [facility]. Assessed patient's mood since last session and patient's current mood. Reviewed patient's implementation of driving to the [facility] twice a week. Discussed recent opportunities for socialization. Assisted patient in discussing and identifying thoughts/feelings triggered by attending appointments. Assisted patient in exploring and identifying triggers for changes in patient's mood. Explored volunteer opportunities to increase patient's opportunity for socialization. Provided psycho education related to grief. Normalized patient's feelings. Provided reflective listening and validation. Clinician requested for homework patient initiate the step of driving to the [facility] twice a week and continue thought record.     Collaboration of Care: not required at this time   Diagnosis:  Adjustment disorder with mixed anxiety and depressed mood   R/O Major Depressive Disorder, Obsessive-Compulsive Disorder, and Generalized Anxiety Disorder   Plan: Patient is to utilize [employer] Therapy, thought re-framing, relaxation techniques, behavioral activation, and coping strategies to decrease symptoms associated with their diagnosis. Frequency: bi-weekly  Modality: individual      Long-term goal:   Increase participation in social opportunities from 1 day per week to 6-7 days per week Target Date: 11/26/23  Progress: progressing    Short-term goal:  Reduce overall level, frequency, and intensity of the feelings of depression and anxiety as evidenced by decreased feelings of loneliness, guilt, depressed mood, loss of interest, difficulty falling asleep and staying asleep, decreased appetite, easily distracted, irritability, and worry from 6 to 7 days/week to 0 days/week per patient report for at least 3 consecutive months. Target Date:  05/29/23  Progress:  progressing    Reduce frequency and intensity of compulsions to check the stove, coffee pot, and water to ensure they are off and check door knob to ensure door is locked prior to patient leaving her home Target Date: 05/29/23  Progress: progressing    Develop and verbalize an understanding of the role that distorted thinking plays in creating fears, excessive worry, and obsessive thoughts. Target Date: 05/29/23  Progress: progressing    Develop and implement healthy coping strategies to utilize in response to stressors Target Date: 05/29/23  Progress: progressing                Doree Barthel, LCSW

## 2023-01-13 NOTE — Progress Notes (Signed)
                Dezi Schaner, LCSW 

## 2023-01-19 ENCOUNTER — Other Ambulatory Visit (INDEPENDENT_AMBULATORY_CARE_PROVIDER_SITE_OTHER): Payer: Self-pay | Admitting: Nurse Practitioner

## 2023-01-19 DIAGNOSIS — I8390 Asymptomatic varicose veins of unspecified lower extremity: Secondary | ICD-10-CM

## 2023-01-20 DIAGNOSIS — H902 Conductive hearing loss, unspecified: Secondary | ICD-10-CM | POA: Diagnosis not present

## 2023-01-20 DIAGNOSIS — H6123 Impacted cerumen, bilateral: Secondary | ICD-10-CM | POA: Diagnosis not present

## 2023-01-22 ENCOUNTER — Ambulatory Visit (INDEPENDENT_AMBULATORY_CARE_PROVIDER_SITE_OTHER): Payer: Medicare HMO

## 2023-01-22 ENCOUNTER — Encounter (INDEPENDENT_AMBULATORY_CARE_PROVIDER_SITE_OTHER): Payer: Medicare HMO | Admitting: Vascular Surgery

## 2023-01-22 DIAGNOSIS — I8390 Asymptomatic varicose veins of unspecified lower extremity: Secondary | ICD-10-CM

## 2023-01-27 ENCOUNTER — Ambulatory Visit: Payer: Medicare HMO | Admitting: Clinical

## 2023-02-04 ENCOUNTER — Ambulatory Visit: Payer: Medicare HMO | Admitting: Dermatology

## 2023-02-04 DIAGNOSIS — D229 Melanocytic nevi, unspecified: Secondary | ICD-10-CM

## 2023-02-04 DIAGNOSIS — Z7189 Other specified counseling: Secondary | ICD-10-CM

## 2023-02-04 DIAGNOSIS — D1801 Hemangioma of skin and subcutaneous tissue: Secondary | ICD-10-CM

## 2023-02-04 DIAGNOSIS — L719 Rosacea, unspecified: Secondary | ICD-10-CM

## 2023-02-04 DIAGNOSIS — L578 Other skin changes due to chronic exposure to nonionizing radiation: Secondary | ICD-10-CM

## 2023-02-04 DIAGNOSIS — L814 Other melanin hyperpigmentation: Secondary | ICD-10-CM | POA: Diagnosis not present

## 2023-02-04 DIAGNOSIS — Z853 Personal history of malignant neoplasm of breast: Secondary | ICD-10-CM

## 2023-02-04 DIAGNOSIS — Z8589 Personal history of malignant neoplasm of other organs and systems: Secondary | ICD-10-CM

## 2023-02-04 DIAGNOSIS — I8393 Asymptomatic varicose veins of bilateral lower extremities: Secondary | ICD-10-CM

## 2023-02-04 DIAGNOSIS — Z85828 Personal history of other malignant neoplasm of skin: Secondary | ICD-10-CM

## 2023-02-04 DIAGNOSIS — L820 Inflamed seborrheic keratosis: Secondary | ICD-10-CM | POA: Diagnosis not present

## 2023-02-04 DIAGNOSIS — W908XXA Exposure to other nonionizing radiation, initial encounter: Secondary | ICD-10-CM | POA: Diagnosis not present

## 2023-02-04 DIAGNOSIS — Z872 Personal history of diseases of the skin and subcutaneous tissue: Secondary | ICD-10-CM

## 2023-02-04 DIAGNOSIS — I781 Nevus, non-neoplastic: Secondary | ICD-10-CM

## 2023-02-04 DIAGNOSIS — L821 Other seborrheic keratosis: Secondary | ICD-10-CM | POA: Diagnosis not present

## 2023-02-04 DIAGNOSIS — Z1283 Encounter for screening for malignant neoplasm of skin: Secondary | ICD-10-CM | POA: Diagnosis not present

## 2023-02-04 NOTE — Progress Notes (Signed)
Follow-Up Visit   Subjective  Jamie Curry is a 77 y.o. female who presents for the following: Skin Cancer Screening and Full Body Skin Exam hx of aks, hx of isks hx of scc,,  Redness at nose, spot at left waistline clothes rub at spot  The patient presents for Total-Body Skin Exam (TBSE) for skin cancer screening and mole check. The patient has spots, moles and lesions to be evaluated, some may be new or changing and the patient may have concern these could be cancer.    The following portions of the chart were reviewed this encounter and updated as appropriate: medications, allergies, medical history  Review of Systems:  No other skin or systemic complaints except as noted in HPI or Assessment and Plan.  Objective  Well appearing patient in no apparent distress; mood and affect are within normal limits.  A full examination was performed including scalp, head, eyes, ears, nose, lips, neck, chest, axillae, abdomen, back, buttocks, bilateral upper extremities, bilateral lower extremities, hands, feet, fingers, toes, fingernails, and toenails. All findings within normal limits unless otherwise noted below.   Relevant physical exam findings are noted in the Assessment and Plan.  left lateral waistline x 1 Erythematous stuck-on, waxy papule or plaque    Assessment & Plan   SKIN CANCER SCREENING PERFORMED TODAY.  ROSACEA Mild pinkness at nose   Chronic and persistent condition with duration or expected duration over one year. Condition is bothersome/symptomatic for patient. Currently flared.  Rosacea is a chronic progressive skin condition usually affecting the face of adults, causing redness and/or acne bumps. It is treatable but not curable. It sometimes affects the eyes (ocular rosacea) as well. It may respond to topical and/or systemic medication and can flare with stress, sun exposure, alcohol, exercise, topical steroids (including hydrocortisone/cortisone 10) and some foods.   Daily application of broad spectrum spf 30+ sunscreen to face is recommended to reduce flares.  Patient denies grittiness of the eyes Treatment Plan  No treatment needed yet.   ACTINIC DAMAGE - Chronic condition, secondary to cumulative UV/sun exposure - diffuse scaly erythematous macules with underlying dyspigmentation - Recommend daily broad spectrum sunscreen SPF 30+ to sun-exposed areas, reapply every 2 hours as needed.  - Staying in the shade or wearing long sleeves, sun glasses (UVA+UVB protection) and wide brim hats (4-inch brim around the entire circumference of the hat) are also recommended for sun protection.  - Call for new or changing lesions.  LENTIGINES, SEBORRHEIC KERATOSES, HEMANGIOMAS - Benign normal skin lesions - Benign-appearing - Call for any changes  MELANOCYTIC NEVI - Tan-brown and/or pink-flesh-colored symmetric macules and papules - Benign appearing on exam today - Observation - Call clinic for new or changing moles - Recommend daily use of broad spectrum spf 30+ sunscreen to sun-exposed areas.   Varicose Veins/Spider Veins - Dilated blue, purple or red veins at the lower extremities - Reassured - Smaller vessels can be treated by sclerotherapy (a procedure to inject a medicine into the veins to make them disappear) if desired, but the treatment is not covered by insurance. Larger vessels may be covered if symptomatic and we would refer to vascular surgeon if treatment desired. Se[facility] and treated [facility]  HISTORY OF SQUAMOUS CELL CARCINOMA OF THE SKIN - No evidence of recurrence today - No lymphadenopathy - Recommend regular full body skin exams - Recommend daily broad spectrum sunscreen SPF 30+ to sun-exposed areas, reapply every 2 hours as needed.  -  Call if any new or changing lesions are noted between office visits  HISTORY OF Breast CANCER  in left  - Clear. Observe for recurrence.  - Call clinic for new or changing lesions.    - Recommend regular skin exams, daily broad-spectrum spf 30+ sunscreen use, and photoprotection.      Inflamed seborrheic keratosis left lateral waistline x 1  Symptomatic, irritating, patient would like treated.  Destruction of lesion - left lateral waistline x 1 Complexity: simple   Destruction method: cryotherapy   Informed consent: discussed and consent obtained   Timeout:  patient name, date of birth, surgical site, and procedure verified Lesion destroyed using liquid nitrogen: Yes   Region frozen until ice ball extended beyond lesion: Yes   Outcome: patient tolerated procedure well with no complications   Post-procedure details: wound care instructions given     Return in about 1 year (around 02/04/2024) for TBSE.  IAsher Muir, CMA, am acting as scribe for Armida Sans, MD.   Documentation: I have reviewed the above documentation for accuracy and completeness, and I agree with the above.  Armida Sans, MD

## 2023-02-04 NOTE — Patient Instructions (Addendum)

## 2023-02-08 ENCOUNTER — Other Ambulatory Visit: Payer: Self-pay | Admitting: Family Medicine

## 2023-02-15 ENCOUNTER — Other Ambulatory Visit: Payer: Self-pay | Admitting: Family Medicine

## 2023-02-15 DIAGNOSIS — C50412 Malignant neoplasm of upper-outer quadrant of left female breast: Secondary | ICD-10-CM

## 2023-02-15 NOTE — Telephone Encounter (Signed)
Sending message for review to Dr. Sharen Hones. Dr. Para March out of office.   LAST APPOINTMENT DATE: 01/05/23   NEXT APPOINTMENT DATE: Visit date not found    LAST REFILL: 11/18/22   QTY: #45 1 rf  No uds or controled contract on file.

## 2023-02-16 ENCOUNTER — Ambulatory Visit: Payer: Medicare HMO | Admitting: Clinical

## 2023-02-16 DIAGNOSIS — F4323 Adjustment disorder with mixed anxiety and depressed mood: Secondary | ICD-10-CM

## 2023-02-16 NOTE — Progress Notes (Unsigned)
                Dezi Schaner, LCSW 

## 2023-02-16 NOTE — Telephone Encounter (Signed)
ERx x 1

## 2023-02-16 NOTE — Progress Notes (Unsigned)
[facility] Counselor/Therapist Progress Note  Patient ID: Jamie Curry, MRN: [id number],    Date: 02/16/2023  Time Spent: 10:33am - 11:26am : 53 minutes   Treatment Type: Individual Therapy  Reported Symptoms: Patietn reproted anxiety, worry  Mental Status Exam: Appearance:  Neat and Well Groomed     Behavior: Appropriate  Motor: Normal  Speech/Language:  Clear and Coherent  Affect: Appropriate  Mood: normal  Thought process: normal  Thought content:   WNL  Sensory/Perceptual disturbances:   WNL  Orientation: oriented to person, place, and situation  Attention: Good  Concentration: Good  Memory: WNL  Fund of knowledge:  Good  Insight:   Good  Judgment:  Good  Impulse Control: Good   Risk Assessment: Danger to Self:  No Patient denied current suicidal ideation  Self-injurious Behavior: No Danger to Others: No Patient denied current homicidal ideation Duty to Warn:no Physical Aggression / Violence:No  Access to Firearms a concern: No  Gang Involvement:No   Subjective: Patient stated, "its been going pretty good" in resposne to events since last session. Patient reported she continues to experience anxiety in the morning. Paitent reported she has been going out in the community. Patient reported she has been worried about the results of patient's recent ultrasound of her veins. Patient reported she has a follow up appointment with the vascular surgeon next week. Provided supportive therapy re: ultrasound and discussed patient's thoughts feelings associated. Patient reported her brother in law plans to acocmplany patient to upcoming appointment with the vascular surgeon. Paitent reported she attended the [facility] several times and has walked at the local senior center several times since last session. Patient stated, "its better" in regard to patietn spending time out in the community. Explored and identified thougths associated with feelings of anxiety. Patient  reported she expeirences shortness of breath when feeling anxious. Thinks about complications occurring when the surgeon goes into patient's veins. Patient stated, "If I could have saw him after the ultrasound that would have saved all this worry". During today's session patient inquired about the role of a psychiatrist. Provided pscyho education related to medication management and the role of a psychiatrist in treatment, and psychotropic meidcations. Patient reported she is currently taking xanax prescribed by patient's PCP. Discussed talking with PCP about her concenrs related to xanax. Psyhco education re" deep breathing. "I'm better" in response to mood today. Patient stated, "Its just going through this (same routine) every day". Practice deep breathing and gratitude joiurnal.   Interventions: Cognitive Behavioral Therapy and Supportive therapy.  Clinician conducted session in person at clinician's office at [facility].   Last -  Clinician conducted session in person at clinician's office at [facility]. Assessed patient's mood since last session and patient's current mood. Reviewed patient's implementation of driving to the [facility] twice a week. Discussed recent opportunities for socialization. Assisted patient in discussing and identifying thoughts/feelings triggered by attending appointments. Assisted patient in exploring and identifying triggers for changes in patient's mood. Explored volunteer opportunities to increase patient's opportunity for socialization. Provided psycho education related to grief. Normalized patient's feelings. Provided reflective listening and validation. Clinician requested for homework patient initiate the step of driving to the [facility] twice a week and continue thought record.     Collaboration of Care: not required at this time   Diagnosis:  Adjustment disorder with mixed anxiety and depressed mood   R/O Major Depressive Disorder, Obsessive-Compulsive  Disorder, and Generalized Anxiety Disorder   Plan: Patient is to  utilize [employer] Therapy, thought re-framing, relaxation techniques, behavioral activation, and coping strategies to decrease symptoms associated with their diagnosis. Frequency: bi-weekly  Modality: individual      Long-term goal:   Increase participation in social opportunities from 1 day per week to 6-7 days per week Target Date: 11/26/23  Progress: progressing    Short-term goal:  Reduce overall level, frequency, and intensity of the feelings of depression and anxiety as evidenced by decreased feelings of loneliness, guilt, depressed mood, loss of interest, difficulty falling asleep and staying asleep, decreased appetite, easily distracted, irritability, and worry from 6 to 7 days/week to 0 days/week per patient report for at least 3 consecutive months. Target Date: 05/29/23  Progress: progressing    Reduce frequency and intensity of compulsions to check the stove, coffee pot, and water to ensure they are off and check door knob to ensure door is locked prior to patient leaving her home Target Date: 05/29/23  Progress: progressing    Develop and verbalize an understanding of the role that distorted thinking plays in creating fears, excessive worry, and obsessive thoughts. Target Date: 05/29/23  Progress: progressing    Develop and implement healthy coping strategies to utilize in response to stressors Target Date: 05/29/23  Progress: progressing     Doree Barthel, LCSW

## 2023-02-17 NOTE — Telephone Encounter (Signed)
Noted. Thanks.

## 2023-02-20 ENCOUNTER — Encounter: Payer: Self-pay | Admitting: Dermatology

## 2023-02-23 ENCOUNTER — Encounter (INDEPENDENT_AMBULATORY_CARE_PROVIDER_SITE_OTHER): Payer: Self-pay | Admitting: Vascular Surgery

## 2023-02-23 ENCOUNTER — Ambulatory Visit (INDEPENDENT_AMBULATORY_CARE_PROVIDER_SITE_OTHER): Payer: Medicare HMO | Admitting: Vascular Surgery

## 2023-02-23 VITALS — BP 139/63 | HR 61 | Resp 16 | Ht 63.0 in | Wt 190.4 lb

## 2023-02-23 DIAGNOSIS — I10 Essential (primary) hypertension: Secondary | ICD-10-CM | POA: Diagnosis not present

## 2023-02-23 DIAGNOSIS — I83813 Varicose veins of bilateral lower extremities with pain: Secondary | ICD-10-CM | POA: Diagnosis not present

## 2023-02-23 DIAGNOSIS — M25471 Effusion, right ankle: Secondary | ICD-10-CM | POA: Diagnosis not present

## 2023-02-23 DIAGNOSIS — E785 Hyperlipidemia, unspecified: Secondary | ICD-10-CM | POA: Diagnosis not present

## 2023-02-23 DIAGNOSIS — M25472 Effusion, left ankle: Secondary | ICD-10-CM | POA: Diagnosis not present

## 2023-02-23 NOTE — Assessment & Plan Note (Addendum)
 A previously performed venous reflux study was done in our office last month.  This demonstrated no deep venous thrombosis or superficial thrombophlebitis.  On the left, there was severe venous reflux over a long segment including the saphenofemoral junction.  On the right, the small saphenous vein and common femoral vein demonstrated reflux.   The patient has CEAP class 3 venous insufficiency. She has already been compliant with the use of 20 to 30 mmHg compression socks under the direction of her primary care physician for many years.  She elevates her legs and takes anti-inflammatories for discomfort.  Despite these measures, she continues to be bothered by daily pain and swelling.  As such, she would benefit from laser ablation of the left great saphenous vein.  This is the predominantly affected leg.  Consideration for laser ablation of the right small saphenous vein could be given in the future depending on her results from laser ablation on the left and how symptomatic her right leg is after treatment of the left.  I discussed the risks and benefits of the procedure.  Patient and her family agree and desire to proceed.

## 2023-02-23 NOTE — Assessment & Plan Note (Signed)
lipid control important in reducing the progression of atherosclerotic disease. Continue statin therapy  

## 2023-02-23 NOTE — Assessment & Plan Note (Signed)
blood pressure control important in reducing the progression of atherosclerotic disease. On appropriate oral medications.  

## 2023-02-23 NOTE — Assessment & Plan Note (Signed)
Venous insufficiency playing a major role.

## 2023-02-23 NOTE — Progress Notes (Addendum)
 Patient ID: Jamie Curry, female   DOB: September 15, 1945, 77 y.o.   MRN: [id number]  Chief Complaint  Patient presents with   New Patient (Initial Visit)    Ref Para March consult varicose veins     HPI Jamie Curry is a 77 y.o. female.  I am asked to see the patient by Dr. Para March for evaluation of pain, swelling, and prominent varicosities predominantly in the left lower extremity.  This has been a problem for the patient for many years.  She has been diligently wearing 20 to 30 mmHg compression socks on essentially a daily basis over the past several years.  She elevates her legs is much as possible.  She remains active.  Despite these things, she continues to have pain and swelling on a daily basis.  She has noticed that her prominent varicosities of the left lower extremity have continued to enlarge over time.  She has some varicosities on the right but not as large as the left.  No open wounds or infection.  No chest pain or shortness of breath.  No fevers or chills.  A previously performed venous reflux study was done in our office last month.  This demonstrated no deep venous thrombosis or superficial thrombophlebitis.  On the left, there was severe venous reflux over a long segment including the saphenofemoral junction.  On the right, the small saphenous vein and common femoral vein demonstrated reflux.     Past Medical History:  Diagnosis Date   Anxiety    Asthma    Breast cancer (HCC) 02/07/2016   left breast invasive lobular carcinoma   CAD (coronary artery disease)    mild CAD by cath in 2001   Depression    Diverticulosis    Heart murmur    HTN (hypertension)    Hyperlipidemia    Palpitations    Personal history of radiation therapy 2018   left breast ca   Squamous cell carcinoma of skin 12/09/2017   R lat base of neck, SCCIS   Syncope    Pre-syncope    Past Surgical History:  Procedure Laterality Date   ABDOMINAL HYSTERECTOMY     BREAST BIOPSY Left 02/07/2016    grade II invasive and in situ mammary carcinoma   BREAST BIOPSY Right 03/31/2023   stereo bx, asymmetry, COIL clip-path pending   BREAST BIOPSY Right 03/31/2023   u/s core axilla butterflu clip path pending   BREAST BIOPSY Right 03/31/2023   MM RT BREAST BX W LOC DEV 1ST LESION IMAGE BX SPEC STEREO GUIDE 03/31/2023 [facility]   BREAST CYST ASPIRATION Left 02/13/2016   cyst   BREAST LUMPECTOMY Left 03/03/2016   invasive lobular carcinoma, clear margins, METASTATIC LOBULAR CARCINOMA, 2.5 MM, IN ONE LYMPH NODE (1/1).    CHOLECYSTECTOMY     KNEE ARTHROSCOPY Right    PARTIAL MASTECTOMY WITH NEEDLE LOCALIZATION Left 03/03/2016   Procedure: PARTIAL MASTECTOMY WITH NEEDLE LOCALIZATION;  Surgeon: Nadeen Landau, MD;  Location: [facility];  Service: General;  Laterality: Left;   SENTINEL NODE BIOPSY Left 03/03/2016   Procedure: SENTINEL NODE BIOPSY;  Surgeon: Nadeen Landau, MD;  Location: [facility];  Service: General;  Laterality: Left;   TUBAL LIGATION       Family History  Problem Relation Age of Onset   Cancer Father        oral cancer   Cancer Sister        colon cancer and lymphoma   Colon cancer Sister  Breast cancer Neg Hx       Social History   Tobacco Use   Smoking status: Never   Smokeless tobacco: Never  Substance Use Topics   Alcohol use: No    Alcohol/week: 0.0 standard drinks of alcohol   Drug use: No     Allergies  Allergen Reactions   Penicillins Anaphylaxis    REACTION: severe anaphylaxis Has patient had a PCN reaction causing immediate rash, facial/tongue/throat swelling, SOB or lightheadedness with hypotension: Yes Has patient had a PCN reaction causing severe rash involving mucus membranes or skin necrosis: No Has patient had a PCN reaction that required hospitalization No Has patient had a PCN reaction occurring within the last 10 years: No If all of the above answers are "NO", then may proceed with Cephalosporin use.    Zithromax  [Azithromycin] Rash    z-pack = rash   Anastrozole Other (See Comments)    Extreme sweating   Cefuroxime Axetil Other (See Comments)    PCN allergy   Codeine Other (See Comments)    unknown   Erythromycin Other (See Comments)    unknown   Nitrofurantoin Other (See Comments)    unknown   Ofloxacin Other (See Comments)    unknown   Prednisone Other (See Comments)   Propoxyphene Other (See Comments)   Tetracycline Other (See Comments)    unknown   Sulfa Antibiotics Other (See Comments) and Rash    Current Outpatient Medications  Medication Sig Dispense Refill   acetaminophen (TYLENOL) 500 MG tablet Take 1,000 mg by mouth every 8 (eight) hours as needed for mild pain (pain score 1-3).     albuterol (VENTOLIN HFA) 108 (90 Base) MCG/ACT inhaler Inhale into the lungs every 6 (six) hours as needed for wheezing or shortness of breath.     ALPRAZolam (XANAX) 0.5 MG tablet TAKE ONE HALF (1/2) TABLET BY MOUTH IN THE MORNING AND ONE TABLET BY MOUTH IN THE EVENING IF NEEDED. SEDATION CAUTION 45 tablet 1   amLODipine (NORVASC) 2.5 MG tablet Take 1 tablet (2.5 mg total) by mouth daily. 90 tablet 3   diclofenac (VOLTAREN) 50 MG EC tablet TAKE ONE TABLET BY MOUTH ONCE A DAY AS NEEDED WITH FOOD. 90 tablet 1   escitalopram (LEXAPRO) 20 MG tablet TAKE ONE TABLET BY MOUTH ONCE A DAY 90 tablet 1   fluticasone (FLONASE) 50 MCG/ACT nasal spray Place into both nostrils.     hydrochlorothiazide (HYDRODIURIL) 25 MG tablet TAKE ONE TABLET BY MOUTH ONCE A DAY 90 tablet 3   loperamide (IMODIUM A-D) 2 MG tablet Take 1 tablet (2 mg total) by mouth 4 (four) times daily as needed for diarrhea or loose stools.     melatonin 5 MG TABS Take 1 tablet (5 mg total) by mouth at bedtime as needed.     montelukast (SINGULAIR) 10 MG tablet TAKE ONE TABLET BY MOUTH ONCE A DAY 90 tablet 3   rosuvastatin (CRESTOR) 10 MG tablet Take 1 tablet (10 mg total) by mouth at bedtime. 90 tablet 3   Cholecalciferol (VITAMIN D3) 50 MCG  (2000 UT) capsule Take 2 capsules (4,000 Units total) by mouth daily.     cyanocobalamin (VITAMIN B12) 1000 MCG tablet Take 1 tablet (1,000 mcg total) by mouth daily.     nystatin powder APPLY 1 APPLICATION TOPICALLY 3 TIMES DAILY 60 g 3   traZODone (DESYREL) 50 MG tablet TAKE 0.5 TABLETS (25 MG TOTAL) BY MOUTH AT BEDTIME AS NEEDED FOR SLEEP. 45 tablet 1  No current facility-administered medications for this visit.      REVIEW OF SYSTEMS (Negative unless checked)  Constitutional: [] Weight loss  [] Fever  [] Chills Cardiac: [] Chest pain   [] Chest pressure   [] Palpitations   [] Shortness of breath when laying flat   [] Shortness of breath at rest   [] Shortness of breath with exertion. Vascular:  [] Pain in legs with walking   [] Pain in legs at rest   [] Pain in legs when laying flat   [] Claudication   [] Pain in feet when walking  [] Pain in feet at rest  [] Pain in feet when laying flat   [] History of DVT   [] Phlebitis   [x] Swelling in legs   [x] Varicose veins   [] Non-healing ulcers Pulmonary:   [] Uses home oxygen   [] Productive cough   [] Hemoptysis   [] Wheeze  [] COPD   [x] Asthma Neurologic:  [] Dizziness  [] Blackouts   [] Seizures   [] History of stroke   [] History of TIA  [] Aphasia   [] Temporary blindness   [] Dysphagia   [] Weakness or numbness in arms   [] Weakness or numbness in legs Musculoskeletal:  [] Arthritis   [] Joint swelling   [x] Joint pain   [] Low back pain Hematologic:  [] Easy bruising  [] Easy bleeding   [] Hypercoagulable state   [] Anemic  [] Hepatitis Gastrointestinal:  [] Blood in stool   [] Vomiting blood  [] Gastroesophageal reflux/heartburn   [] Abdominal pain Genitourinary:  [] Chronic kidney disease   [] Difficult urination  [] Frequent urination  [] Burning with urination   [] Hematuria Skin:  [] Rashes   [] Ulcers   [] Wounds Psychological:  [x] History of anxiety   [x]  History of major depression.    Physical Exam BP 139/63 (BP Location: Right Arm)   Pulse 61   Resp 16   Ht 5\' 3"  (1.6 m)    Wt 190 lb 6.4 oz (86.4 kg)   BMI 33.73 kg/m  Gen:  WD/WN, NAD. Appears younger than stated age. Head: [place]/AT, No temporalis wasting.  Ear/Nose/Throat: Hearing grossly intact, nares w/o erythema or drainage, oropharynx w/o Erythema/Exudate Eyes: Conjunctiva clear, sclera non-icteric  Neck: trachea midline.  No JVD.  Pulmonary:  Good air movement, respirations not labored, no use of accessory muscles  Cardiac: RRR, no JVD Vascular:  Vessel Right Left  Radial Palpable Palpable                                   Gastrointestinal:. No masses, surgical incisions, or scars. Musculoskeletal: M/S 5/5 throughout.  Extremities without ischemic changes.  No deformity or atrophy.  Scattered varicosities are present in the right lower extremity measuring up to 2 to 3 mm in diameter.  On the left side, the varicosities are extensive and measure up to 3 to 4 mm in diameter.  These are most prominent on the medial thigh and calf and down around the lower leg and ankle area.  Trace right lower extremity edema, 1+ left lower extremity edema. Neurologic: Sensation grossly intact in extremities.  Symmetrical.  Speech is fluent. Motor exam as listed above. Psychiatric: Judgment intact, Mood & affect appropriate for pt's clinical situation. Dermatologic: No rashes or ulcers noted.  No cellulitis or open wounds.    Radiology No results found.  Labs Recent Results (from the past 2160 hours)  VITAMIN D 25 Hydroxy (Vit-D Deficiency, Fractures)     Status: Abnormal   Collection Time: 03/26/23 12:28 PM  Result Value Ref Range   VITD 26.82 (L) 30.00 - 100.00 ng/mL  Surgical  pathology     Status: None   Collection Time: 03/31/23 12:00 AM  Result Value Ref Range   SURGICAL PATHOLOGY      SURGICAL PATHOLOGY [facility] [address] [place], [place] [address] Telephone [phone] or [phone] Fax [phone]  REPORT OF SURGICAL PATHOLOGY   Accession #:  [id number] Patient Name: ELIZETH, WEINRICH Visit # : [id number]  MRN: [id number] Physician: Jacob Moores DOB/Age 07-Apr-1946 (Age: 24) Gender: F Collected Date: 03/31/2023 Received Date: 03/31/2023  FINAL DIAGNOSIS       1. Breast, right, needle core biopsy, upper outer quadrant (coil clip) :       - DENSE FIBROUS STROMA WITH AREAS OF FIBROADENOMATOID CHANGE.      - PSEUDOANGIOMATOUS STROMAL HYPERPLASIA.      - SCLEROSING ADENOSIS WITH CALCIFICATION.      - NEGATIVE FOR ATYPIA AND MALIGNANCY.       2. Lymph node, needle/core biopsy, right axilla (hydromark butterfly clip) :       - REACTIVE LYMPH NODE, NEGATIVE FOR MALIGNANCY.       ELECTRONIC SIGNATURE : Rubinas Md, Delice Bison , Sports administrator, Electronic Signature  MICROSCOPIC DESCRIPTION  CASE COMMENT S STAINS USED IN DIAGNOSIS: H&E-2 H&E-3 H&E-4 H&E H&E-2 H&E-3 H&E-4 H&E H&E    CLINICAL HISTORY  SPECIMEN(S) OBTAINED 1. Breast, right, needle core biopsy, Upper Outer Quadrant (coil Clip) 2. Lymph node, needle/core biopsy, Right Axilla (hydromark Butterfly Clip)  SPECIMEN COMMENTS: 1. TIF: 7:58 AM, CIT 2 min; screen detected findings, history of left breast cancer 2. TIF: 8:32 AM, CIT < 1 min SPECIMEN CLINICAL INFORMATION: 1. Fibrosis, PASH, benign tissue, R/O malignancy 2. Reactive vs metastatic disease    Gross Description 1. Received in formalin, time in formalin 7:58 a.m.and CIT 2 minutes, are fragments and cores of soft tan yellow tissue measuring 2 x 2 x 0.5 cm in aggregate.The specimen is entirely submitted in two cassettes. 2. Received in formalin, time in formalin 8:32 a.m.and CIT less than 1 minute, are 0.7 x 0.6 x 0.2 cm of soft tan yellow tissue.The specimen is submitted in toto.(GRP:kh 03/31/23)        Report signed out from the  following location(s) [place] [address], [place] [address] CLIA #: [id number]  [facility] [address] [place], [place] [address] CLIA #: [id number]     Assessment/Plan:  Varicose veins of both lower extremities with pain A previously performed venous reflux study was done in our office last month.  This demonstrated no deep venous thrombosis or superficial thrombophlebitis.  On the left, there was severe venous reflux over a long segment including the saphenofemoral junction.  On the right, the small saphenous vein and common femoral vein demonstrated reflux.   The patient has CEAP class 3 venous insufficiency. She has already been compliant with the use of 20 to 30 mmHg compression socks under the direction of her primary care physician for many years.  She elevates her legs and takes anti-inflammatories for discomfort.  Despite these measures, she continues to be bothered by daily pain and swelling.  As such, she would benefit from laser ablation of the left great saphenous vein.  This is the predominantly affected leg.  Consideration for laser ablation of the right small saphenous vein could be given in the future depending on her results from laser ablation on the left and how symptomatic her right leg is after treatment of the left.  I discussed the risks and benefits of the procedure.  Patient and her family agree and desire to proceed.  HTN (hypertension) blood pressure control important in reducing the progression of atherosclerotic disease. On appropriate oral medications.   Ankle edema, bilateral Venous insufficiency playing a major role.  Hyperlipidemia lipid control important in reducing the progression of atherosclerotic disease. Continue statin therapy      Festus Barren 06/08/2023, 5:06 PM   This note was created with Dragon medical transcription system.  Any errors from dictation are unintentional.

## 2023-03-02 ENCOUNTER — Ambulatory Visit (INDEPENDENT_AMBULATORY_CARE_PROVIDER_SITE_OTHER): Payer: Medicare HMO | Admitting: Family Medicine

## 2023-03-02 ENCOUNTER — Encounter: Payer: Self-pay | Admitting: Family Medicine

## 2023-03-02 VITALS — BP 126/70 | HR 57 | Temp 98.9°F | Ht 63.0 in | Wt 189.6 lb

## 2023-03-02 DIAGNOSIS — R413 Other amnesia: Secondary | ICD-10-CM

## 2023-03-02 DIAGNOSIS — R350 Frequency of micturition: Secondary | ICD-10-CM

## 2023-03-02 NOTE — Progress Notes (Unsigned)
Urinary frequency with some prev lower back pain.  Taking tylenol at night, that helps with back pain.  No burning with urination.  No fevers.  No vomiting.    Some occ memory changes, with occ lapses over the last few months.  "Like I was going to forget what I was saying."  Occ repeating herself.  Not getting lost.  Not leaving the stove on.  No red flag events.    Meds, vitals, and allergies reviewed.   ROS: Per HPI unless specifically indicated in ROS section   Nad Ncat Neck supple, no LA Rrr Ctab Still with BLE edema in compressions stockings.  Lower back ttp midline.  No CVA pain.  Abd soft, not ttp MMSE 29/30.  -1 for pentagon copying.

## 2023-03-02 NOTE — Patient Instructions (Signed)
Go to the lab on the way out.   If you have mychart we'll likely use that to update you.    Take care.  Glad to see you. 

## 2023-03-03 ENCOUNTER — Other Ambulatory Visit: Payer: Self-pay | Admitting: Family Medicine

## 2023-03-03 DIAGNOSIS — R350 Frequency of micturition: Secondary | ICD-10-CM | POA: Insufficient documentation

## 2023-03-03 DIAGNOSIS — R413 Other amnesia: Secondary | ICD-10-CM | POA: Insufficient documentation

## 2023-03-03 LAB — URINALYSIS, ROUTINE W REFLEX MICROSCOPIC
Bilirubin Urine: NEGATIVE
Hgb urine dipstick: NEGATIVE
Ketones, ur: NEGATIVE
Nitrite: NEGATIVE
RBC / HPF: NONE SEEN (ref 0–?)
Specific Gravity, Urine: 1.01 (ref 1.000–1.030)
Total Protein, Urine: NEGATIVE
Urine Glucose: NEGATIVE
Urobilinogen, UA: 0.2 (ref 0.0–1.0)
pH: 6.5 (ref 5.0–8.0)

## 2023-03-03 LAB — VITAMIN B12: Vitamin B-12: 277 pg/mL (ref 211–911)

## 2023-03-03 LAB — URINE CULTURE
MICRO NUMBER:: 15750572
Result:: NO GROWTH
SPECIMEN QUALITY:: ADEQUATE

## 2023-03-03 MED ORDER — VITAMIN B-12 1000 MCG PO TABS: 1000.0000 ug | ORAL_TABLET | Freq: Every day | ORAL | Status: AC

## 2023-03-03 NOTE — Assessment & Plan Note (Signed)
MMSE 29/30.  -1 for pentagon copying.   Previous CBC and TSH unremarkable.  Check B12 today.  See notes on labs.  Discussed with patient about observation and we can recheck later on.

## 2023-03-03 NOTE — Assessment & Plan Note (Signed)
Benign abdominal exam.  See notes on labs.

## 2023-03-10 ENCOUNTER — Ambulatory Visit: Payer: Medicare HMO | Admitting: Clinical

## 2023-03-15 ENCOUNTER — Other Ambulatory Visit: Payer: Self-pay | Admitting: Family Medicine

## 2023-03-15 DIAGNOSIS — Z170 Estrogen receptor positive status [ER+]: Secondary | ICD-10-CM

## 2023-03-17 NOTE — Telephone Encounter (Signed)
Sent. Thanks.   

## 2023-03-17 NOTE — Telephone Encounter (Signed)
Forward to PCP.

## 2023-03-18 ENCOUNTER — Ambulatory Visit
Admission: RE | Admit: 2023-03-18 | Discharge: 2023-03-18 | Disposition: A | Discharge status: Home / Self Care | Payer: Medicare HMO | Source: Ambulatory Visit | Attending: Family Medicine | Admitting: Family Medicine

## 2023-03-18 ENCOUNTER — Ambulatory Visit
Admission: RE | Admit: 2023-03-18 | Discharge: 2023-03-18 | Disposition: A | Discharge status: Home / Self Care | Payer: Medicare HMO | Source: Ambulatory Visit | Attending: Oncology | Admitting: Oncology

## 2023-03-18 DIAGNOSIS — Z08 Encounter for follow-up examination after completed treatment for malignant neoplasm: Secondary | ICD-10-CM

## 2023-03-18 DIAGNOSIS — M85832 Other specified disorders of bone density and structure, left forearm: Secondary | ICD-10-CM | POA: Diagnosis not present

## 2023-03-18 DIAGNOSIS — Z853 Personal history of malignant neoplasm of breast: Secondary | ICD-10-CM | POA: Insufficient documentation

## 2023-03-18 DIAGNOSIS — R928 Other abnormal and inconclusive findings on diagnostic imaging of breast: Secondary | ICD-10-CM | POA: Diagnosis not present

## 2023-03-18 DIAGNOSIS — Z780 Asymptomatic menopausal state: Secondary | ICD-10-CM | POA: Insufficient documentation

## 2023-03-18 DIAGNOSIS — Z1231 Encounter for screening mammogram for malignant neoplasm of breast: Secondary | ICD-10-CM | POA: Insufficient documentation

## 2023-03-21 ENCOUNTER — Encounter: Payer: Self-pay | Admitting: Family Medicine

## 2023-03-21 DIAGNOSIS — M8580 Other specified disorders of bone density and structure, unspecified site: Secondary | ICD-10-CM | POA: Insufficient documentation

## 2023-03-22 ENCOUNTER — Other Ambulatory Visit: Payer: Self-pay | Admitting: Oncology

## 2023-03-22 DIAGNOSIS — R928 Other abnormal and inconclusive findings on diagnostic imaging of breast: Secondary | ICD-10-CM

## 2023-03-24 ENCOUNTER — Other Ambulatory Visit: Payer: Self-pay | Admitting: Oncology

## 2023-03-24 ENCOUNTER — Inpatient Hospital Stay
Admission: RE | Admit: 2023-03-24 | Discharge: 2023-03-24 | Discharge status: Home / Self Care | Payer: Medicare HMO | Source: Ambulatory Visit | Attending: Oncology

## 2023-03-24 ENCOUNTER — Ambulatory Visit
Admission: RE | Admit: 2023-03-24 | Discharge: 2023-03-24 | Disposition: A | Discharge status: Home / Self Care | Payer: Medicare HMO | Source: Ambulatory Visit | Attending: Oncology | Admitting: Oncology

## 2023-03-24 DIAGNOSIS — R928 Other abnormal and inconclusive findings on diagnostic imaging of breast: Secondary | ICD-10-CM

## 2023-03-24 DIAGNOSIS — C50912 Malignant neoplasm of unspecified site of left female breast: Secondary | ICD-10-CM | POA: Diagnosis not present

## 2023-03-24 DIAGNOSIS — R92321 Mammographic fibroglandular density, right breast: Secondary | ICD-10-CM | POA: Diagnosis not present

## 2023-03-24 DIAGNOSIS — R590 Localized enlarged lymph nodes: Secondary | ICD-10-CM | POA: Diagnosis not present

## 2023-03-26 ENCOUNTER — Encounter: Payer: Self-pay | Admitting: Family Medicine

## 2023-03-26 ENCOUNTER — Ambulatory Visit (INDEPENDENT_AMBULATORY_CARE_PROVIDER_SITE_OTHER): Payer: Medicare HMO | Admitting: Family Medicine

## 2023-03-26 VITALS — BP 118/64 | HR 65 | Temp 98.3°F | Ht 63.0 in | Wt 189.8 lb

## 2023-03-26 DIAGNOSIS — M8580 Other specified disorders of bone density and structure, unspecified site: Secondary | ICD-10-CM | POA: Diagnosis not present

## 2023-03-26 LAB — VITAMIN D 25 HYDROXY (VIT D DEFICIENCY, FRACTURES): VITD: 26.82 ng/mL — ABNORMAL LOW (ref 30.00–100.00)

## 2023-03-26 NOTE — Progress Notes (Unsigned)
She has breast biopsy pending, d/w pt. previous mammogram report discussed with patient.  DXA d/w pt.  D/w pt about checking vit D today.  Results and osteoporosis/osteopenia path/phys d/w pt.    Meds, vitals, and allergies reviewed.   ROS: Per HPI unless specifically indicated in ROS section   Nad Ncat Neck supple no LA Rrr Ctab

## 2023-03-26 NOTE — Patient Instructions (Signed)
Check vitamin D today.  We'll update you about that.  I'll await your biopsy results.  Take care.  Glad to see you.

## 2023-03-28 ENCOUNTER — Other Ambulatory Visit: Payer: Self-pay | Admitting: Family Medicine

## 2023-03-28 DIAGNOSIS — E559 Vitamin D deficiency, unspecified: Secondary | ICD-10-CM

## 2023-03-28 MED ORDER — VITAMIN D3 50 MCG (2000 UT) PO CAPS: 4000.0000 [IU] | ORAL_CAPSULE | Freq: Every day | ORAL | Status: AC

## 2023-03-28 NOTE — Assessment & Plan Note (Signed)
She has a breast biopsy pending and that is the most pressing issue. We agreed to check vit D and defer other meds for now, with biopsy pending.  She agrees with plan.

## 2023-03-31 ENCOUNTER — Ambulatory Visit
Admission: RE | Admit: 2023-03-31 | Discharge: 2023-03-31 | Disposition: A | Discharge status: Home / Self Care | Payer: Medicare HMO | Source: Ambulatory Visit | Attending: Oncology | Admitting: Oncology

## 2023-03-31 ENCOUNTER — Ambulatory Visit
Admission: RE | Admit: 2023-03-31 | Discharge: 2023-03-31 | Disposition: A | Discharge status: Home / Self Care | Payer: Medicare HMO | Source: Ambulatory Visit | Attending: Oncology

## 2023-03-31 DIAGNOSIS — N6021 Fibroadenosis of right breast: Secondary | ICD-10-CM | POA: Diagnosis not present

## 2023-03-31 DIAGNOSIS — N6489 Other specified disorders of breast: Secondary | ICD-10-CM | POA: Diagnosis not present

## 2023-03-31 DIAGNOSIS — R928 Other abnormal and inconclusive findings on diagnostic imaging of breast: Secondary | ICD-10-CM | POA: Diagnosis not present

## 2023-03-31 DIAGNOSIS — R590 Localized enlarged lymph nodes: Secondary | ICD-10-CM | POA: Insufficient documentation

## 2023-03-31 DIAGNOSIS — Z853 Personal history of malignant neoplasm of breast: Secondary | ICD-10-CM | POA: Diagnosis not present

## 2023-03-31 DIAGNOSIS — R921 Mammographic calcification found on diagnostic imaging of breast: Secondary | ICD-10-CM | POA: Diagnosis not present

## 2023-03-31 DIAGNOSIS — R599 Enlarged lymph nodes, unspecified: Secondary | ICD-10-CM | POA: Diagnosis not present

## 2023-03-31 HISTORY — PX: BREAST BIOPSY: SHX20

## 2023-03-31 MED ORDER — LIDOCAINE 1 % OPTIME INJ - NO CHARGE
5.0000 mL | Freq: Once | INTRAMUSCULAR | Status: AC
  Administered 2023-03-31: 5 mL
  Filled 2023-03-31: qty 6

## 2023-03-31 MED ORDER — LIDOCAINE 1 % OPTIME INJ - NO CHARGE
2.0000 mL | Freq: Once | INTRAMUSCULAR | Status: AC
  Administered 2023-03-31: 2 mL
  Filled 2023-03-31: qty 2

## 2023-03-31 MED ORDER — LIDOCAINE-EPINEPHRINE 1 %-1:100000 IJ SOLN
20.0000 mL | Freq: Once | INTRAMUSCULAR | Status: AC
  Administered 2023-03-31: 20 mL
  Filled 2023-03-31: qty 20

## 2023-03-31 MED ORDER — LIDOCAINE-EPINEPHRINE 1 %-1:100000 IJ SOLN
10.0000 mL | Freq: Once | INTRAMUSCULAR | Status: AC
  Administered 2023-03-31: 10 mL
  Filled 2023-03-31: qty 10

## 2023-04-01 ENCOUNTER — Telehealth: Payer: Self-pay | Admitting: *Deleted

## 2023-04-01 LAB — SURGICAL PATHOLOGY

## 2023-04-01 NOTE — Telephone Encounter (Signed)
Called the patient to let her know that Dr. Smith Robert says she looked at the pathology of the breast biopsy and it was negative for cancer.  Patient is happy about those results and she will see her back in March 2025

## 2023-04-21 ENCOUNTER — Telehealth: Payer: Self-pay

## 2023-04-21 MED ORDER — TRAZODONE HCL 50 MG PO TABS: 25.0000 mg | ORAL_TABLET | Freq: Every evening | ORAL | 0 refills | Status: DC | PRN

## 2023-04-21 NOTE — Telephone Encounter (Signed)
 Copied from CRM [id number]. Topic: Clinical - Medical Advice >> Apr 21, 2023  2:29 PM Curlee H wrote: Reason for CRM: Patient has been taking melatonin 5 MG TABS as instructed along with her other medications such as Xanax  - the patient reports that she still doesn't get a full night's sleep. She will wake up in the middle of the night, have to go to the bathroom and not able to go back to sleep - this will happen several times a night and she also reports still having the dreams that she mentioned to Dr. Cleatus. She would like to know if there's anything else that she can do or if there's something else that they can call in for her.

## 2023-04-21 NOTE — Telephone Encounter (Signed)
 I would try stopping melatonin and try adding on a half a tablet of trazodone at night to see if that helps her with sleep.  Please let me know how it goes.  I sent the prescription.  Thanks.

## 2023-04-22 NOTE — Telephone Encounter (Signed)
 Spoke with patient and advised of Dr. Lianne Bushy instructions. Patient verbalized understanding

## 2023-04-26 ENCOUNTER — Other Ambulatory Visit: Payer: Self-pay | Admitting: *Deleted

## 2023-04-26 MED ORDER — NYSTATIN 100000 UNIT/GM EX POWD: CUTANEOUS | 3 refills | Status: AC

## 2023-04-26 NOTE — Progress Notes (Signed)
 Got a message that nystatin powder has been approved

## 2023-05-12 ENCOUNTER — Other Ambulatory Visit: Payer: Self-pay | Admitting: Family Medicine

## 2023-05-12 DIAGNOSIS — Z170 Estrogen receptor positive status [ER+]: Secondary | ICD-10-CM

## 2023-05-13 DIAGNOSIS — I10 Essential (primary) hypertension: Secondary | ICD-10-CM | POA: Diagnosis not present

## 2023-05-13 DIAGNOSIS — H8110 Benign paroxysmal vertigo, unspecified ear: Secondary | ICD-10-CM | POA: Diagnosis not present

## 2023-05-13 DIAGNOSIS — Z1211 Encounter for screening for malignant neoplasm of colon: Secondary | ICD-10-CM | POA: Diagnosis not present

## 2023-05-13 NOTE — Telephone Encounter (Signed)
Last office visit: 03/26/23 Next office visit: nothing scheduled Last refill:  ALPRAZOLAM 0.5MG  TABLET 03/17/23 45 tablets 1 refill

## 2023-05-18 DIAGNOSIS — H25813 Combined forms of age-related cataract, bilateral: Secondary | ICD-10-CM | POA: Diagnosis not present

## 2023-05-19 ENCOUNTER — Other Ambulatory Visit: Payer: Self-pay | Admitting: Family Medicine

## 2023-05-20 NOTE — Telephone Encounter (Signed)
 Sent. Thanks.

## 2023-05-20 NOTE — Telephone Encounter (Signed)
 Last office Visit:03/26/23 Next office visit: nothing scheduled Last refill date:  TRAZODONE  HYDROCHLORIDE 50MG  TABLET 04/21/23 15 tablets 0 refills

## 2023-05-25 ENCOUNTER — Telehealth: Payer: Self-pay

## 2023-05-25 NOTE — Telephone Encounter (Signed)
Should be fine to take together.  Thanks.

## 2023-05-25 NOTE — Telephone Encounter (Signed)
Copied from CRM [id number]. Topic: General - Other >> May 25, 2023  1:11 PM Jon Gills C wrote: Reason for CRM: Patient called in wanting to know if it was okay if she took her  Cholecalciferol (VITAMIN D3) 50 MCG (2000 UT) capsule and her calcium citrate together she would like a callback regarding this matter

## 2023-05-26 NOTE — Telephone Encounter (Signed)
Patient notified

## 2023-06-14 ENCOUNTER — Other Ambulatory Visit: Payer: Self-pay | Admitting: Family Medicine

## 2023-06-15 DIAGNOSIS — K648 Other hemorrhoids: Secondary | ICD-10-CM | POA: Diagnosis not present

## 2023-06-15 DIAGNOSIS — R1032 Left lower quadrant pain: Secondary | ICD-10-CM | POA: Diagnosis not present

## 2023-06-15 DIAGNOSIS — Z1211 Encounter for screening for malignant neoplasm of colon: Secondary | ICD-10-CM | POA: Diagnosis not present

## 2023-06-15 DIAGNOSIS — G8929 Other chronic pain: Secondary | ICD-10-CM | POA: Diagnosis not present

## 2023-06-15 DIAGNOSIS — K5909 Other constipation: Secondary | ICD-10-CM | POA: Diagnosis not present

## 2023-06-15 NOTE — Telephone Encounter (Signed)
 LOV 03/26/23  NOV not scheduled  LAST REFILL 12/21/22 #90 W/1 REFILL

## 2023-06-21 ENCOUNTER — Telehealth: Payer: Self-pay

## 2023-06-21 NOTE — Telephone Encounter (Signed)
 Copied from CRM [id number]. Topic: Appointments - Appointment Scheduling >> Jun 21, 2023 11:12 AM Turkey A wrote: Patient called regarding fluticasone (FLONASE) 50 MCG/ACT nasal spray; patient said that she has been having more mucus in her nose for past two week. Pt thinks it may be due to nose spray-please call

## 2023-06-22 NOTE — Telephone Encounter (Signed)
 I would keep the appointment and hold flonase in the meantime.  Thanks.

## 2023-06-22 NOTE — Telephone Encounter (Signed)
 Please see what details you can get.  This would be an atypical reaction to the medication itself.  Thanks.

## 2023-06-22 NOTE — Telephone Encounter (Signed)
 I spoke with pt; starting 4-5 wks ago noticed more mucus in lt nostril. If stopped Flonase less mucus in nose. Pt does not have fever, no CP or SOB and no dizziness; pt has non prod cough and on and off H/A. Pt also has dull lower back pain on rt lower back; hurts worse when walking for any length of time. Pt has no UTI symptoms. Pt scheduled appt with dr Para March on 06/25/23 at 9 AM. Sending note to Dr Para March.

## 2023-06-23 NOTE — Telephone Encounter (Signed)
 Patient notified

## 2023-06-25 ENCOUNTER — Ambulatory Visit
Admission: RE | Admit: 2023-06-25 | Discharge: 2023-06-25 | Disposition: A | Discharge status: Home / Self Care | Source: Ambulatory Visit | Attending: Family Medicine | Admitting: Family Medicine

## 2023-06-25 ENCOUNTER — Ambulatory Visit: Admitting: Family Medicine

## 2023-06-25 VITALS — BP 122/70 | HR 75 | Temp 98.5°F | Ht 63.0 in | Wt 190.0 lb

## 2023-06-25 DIAGNOSIS — E559 Vitamin D deficiency, unspecified: Secondary | ICD-10-CM | POA: Diagnosis not present

## 2023-06-25 DIAGNOSIS — M47816 Spondylosis without myelopathy or radiculopathy, lumbar region: Secondary | ICD-10-CM | POA: Diagnosis not present

## 2023-06-25 DIAGNOSIS — M549 Dorsalgia, unspecified: Secondary | ICD-10-CM

## 2023-06-25 DIAGNOSIS — R630 Anorexia: Secondary | ICD-10-CM

## 2023-06-25 DIAGNOSIS — M5136 Other intervertebral disc degeneration, lumbar region with discogenic back pain only: Secondary | ICD-10-CM | POA: Diagnosis not present

## 2023-06-25 LAB — CBC WITH DIFFERENTIAL/PLATELET
Basophils Absolute: 0.1 10*3/uL (ref 0.0–0.1)
Basophils Relative: 0.7 % (ref 0.0–3.0)
Eosinophils Absolute: 0.1 10*3/uL (ref 0.0–0.7)
Eosinophils Relative: 1.3 % (ref 0.0–5.0)
HCT: 36.1 % (ref 36.0–46.0)
Hemoglobin: 12 g/dL (ref 12.0–15.0)
Lymphocytes Relative: 13.1 % (ref 12.0–46.0)
Lymphs Abs: 1 10*3/uL (ref 0.7–4.0)
MCHC: 33.4 g/dL (ref 30.0–36.0)
MCV: 92.2 fl (ref 78.0–100.0)
Monocytes Absolute: 0.7 10*3/uL (ref 0.1–1.0)
Monocytes Relative: 9 % (ref 3.0–12.0)
Neutro Abs: 5.8 10*3/uL (ref 1.4–7.7)
Neutrophils Relative %: 75.9 % (ref 43.0–77.0)
Platelets: 209 10*3/uL (ref 150.0–400.0)
RBC: 3.91 Mil/uL (ref 3.87–5.11)
RDW: 14.4 % (ref 11.5–15.5)
WBC: 7.6 10*3/uL (ref 4.0–10.5)

## 2023-06-25 LAB — COMPREHENSIVE METABOLIC PANEL
ALT: 23 U/L (ref 0–35)
AST: 28 U/L (ref 0–37)
Albumin: 4.7 g/dL (ref 3.5–5.2)
Alkaline Phosphatase: 97 U/L (ref 39–117)
BUN: 22 mg/dL (ref 6–23)
CO2: 29 meq/L (ref 19–32)
Calcium: 9.7 mg/dL (ref 8.4–10.5)
Chloride: 95 meq/L — ABNORMAL LOW (ref 96–112)
Creatinine, Ser: 1.07 mg/dL (ref 0.40–1.20)
GFR: 49.83 mL/min — ABNORMAL LOW (ref >60.00)
Glucose, Bld: 114 mg/dL — ABNORMAL HIGH (ref 70–99)
Potassium: 4.2 meq/L (ref 3.5–5.1)
Sodium: 135 meq/L (ref 135–145)
Total Bilirubin: 1.2 mg/dL (ref 0.2–1.2)
Total Protein: 7.4 g/dL (ref 6.0–8.3)

## 2023-06-25 LAB — TSH: TSH: 2.99 u[IU]/mL (ref 0.35–5.50)

## 2023-06-25 LAB — VITAMIN D 25 HYDROXY (VIT D DEFICIENCY, FRACTURES): VITD: 28.78 ng/mL — ABNORMAL LOW (ref 30.00–100.00)

## 2023-06-25 NOTE — Patient Instructions (Addendum)
 Go to the lab on the way out.   If you have mychart we'll likely use that to update you.    Take care.  Glad to see you. Stay off flonase for now.  Try tylenol for back pain in the meantime.

## 2023-06-25 NOTE — Progress Notes (Signed)
 Had been taking lactulose for constipation with relief.  Discussed.  No adverse effect of medication.  She stopped flonase in the meantime.  It prev helped with rhinitis and sinus pressure.  Then had more congestion on the L side.  Had cough, may or may not have been related to medication use.  Dry cough is better off flonase.    Back ache, going on for months.  Had been walking and exercising.  She had a pulling in the R middle back, then R lower back and L lower back.  Tylenol helps.    She had lower appetite but isn't losing weight.  Has colonoscopy pending. Weight is stable.  No radicular leg pain.   Due for recheck vit D.  Still on replacement.   She had lower ext studies with venous reflux.    Meds, vitals, and allergies reviewed.   ROS: Per HPI unless specifically indicated in ROS section   Nad Ncat Neck supple, no LA Rrr Ctab Abdomen soft.  Nontender.  Normal bowel sounds. 1+ BLE edema in compression stockings.  L lower back ttp.  Midline back not tender. No CVA pain.  Normal hip ROM B.  SLR neg B B knee crepitus on ROM.

## 2023-06-27 ENCOUNTER — Other Ambulatory Visit: Payer: Self-pay | Admitting: Family Medicine

## 2023-06-27 DIAGNOSIS — E559 Vitamin D deficiency, unspecified: Secondary | ICD-10-CM

## 2023-06-27 DIAGNOSIS — R630 Anorexia: Secondary | ICD-10-CM | POA: Insufficient documentation

## 2023-06-27 DIAGNOSIS — M549 Dorsalgia, unspecified: Secondary | ICD-10-CM | POA: Insufficient documentation

## 2023-06-27 NOTE — Assessment & Plan Note (Signed)
 Reasonable to check plain films.  Take Tylenol for pain.  See notes on imaging.  Okay for outpatient follow-up.

## 2023-06-27 NOTE — Assessment & Plan Note (Signed)
 History of vitamin D deficiency.  Has been on replacement.  See notes on labs.

## 2023-06-27 NOTE — Assessment & Plan Note (Signed)
 Not losing weight.  See notes on labs.

## 2023-06-28 ENCOUNTER — Other Ambulatory Visit: Payer: Medicare HMO

## 2023-07-09 ENCOUNTER — Encounter: Payer: Self-pay | Admitting: Oncology

## 2023-07-09 ENCOUNTER — Inpatient Hospital Stay: Payer: Medicare HMO | Attending: Oncology | Admitting: Oncology

## 2023-07-09 VITALS — BP 110/53 | HR 78 | Temp 97.3°F | Resp 18 | Ht 63.0 in | Wt 191.4 lb

## 2023-07-09 DIAGNOSIS — Z1721 Progesterone receptor positive status: Secondary | ICD-10-CM | POA: Diagnosis not present

## 2023-07-09 DIAGNOSIS — Z1732 Human epidermal growth factor receptor 2 negative status: Secondary | ICD-10-CM | POA: Insufficient documentation

## 2023-07-09 DIAGNOSIS — Z853 Personal history of malignant neoplasm of breast: Secondary | ICD-10-CM | POA: Diagnosis not present

## 2023-07-09 DIAGNOSIS — Z170 Estrogen receptor positive status [ER+]: Secondary | ICD-10-CM | POA: Insufficient documentation

## 2023-07-09 DIAGNOSIS — Z08 Encounter for follow-up examination after completed treatment for malignant neoplasm: Secondary | ICD-10-CM

## 2023-07-09 DIAGNOSIS — Z800 Family history of malignant neoplasm of digestive organs: Secondary | ICD-10-CM | POA: Diagnosis not present

## 2023-07-09 NOTE — Progress Notes (Signed)
 Hematology/Oncology Consult note [facility]  Telephone:([phone] [phone]  Patient Care Team: Joaquim Nam, MD as PCP - General (Family Medicine) Kathyrn Sheriff, [facility] (Inactive) as Pharmacist (Pharmacist)   Name of the patient: Jamie Curry  [id number]  [birth year]   Date of visit: 07/09/23  Diagnosis- Stage IIb pT2N1a(sn)cM0 invasive lobular carcinoma of the left breast ER 10% positive, PR 10% positive, HER-2/neu negative status post lumpectomy/SLND   Chief complaint/ Reason for visit-routine follow-up of breast cancer  Heme/Onc history:  Oncology History  Breast cancer of upper-outer quadrant of left female breast (HCC)  02/15/2008 Imaging   Small  probable mildly complicated cysts within the upper outer quadrant right breast at the 11 o'clock position in the region of questionable nodularity noted on mammography.  Recommend follow-up right breast diagnostic mammogram and ultrasound in 6 months.      08/13/2008 Imaging   Ultrasound is performed, showing stable small minimal complicated cysts at the right breast 11 o'clock position 4 cm from the nipple, not changed compared prior exam   09/23/2010 Imaging   1.3 cm simple cyst located within the left breast at the 12 o'clock position corresponding to the mammographic finding.  No findings worrisome for malignancy.  Recommend annual screening mammography   09/23/2010 Mammogram   1.3 cm simple cyst located within the left breast at the 12 o'clock position corresponding to the mammographic finding.  No findings worrisome for malignancy.    02/05/2015 Imaging   A 2.5 x 3.5 cm oval circumscribed mass within the upper retroareolar left breast is identified. No suspicious calcifications or distortion identified.   On physical exam, a firm palpable mobile mass is identified at the 11:30 position of the left breast 4 cm from the nipple. Targeted ultrasound is performed, showing a 2.8 x 1.4 x  3.6 cm simple cyst at the 11:30 position of the left breast, corresponding to the screening study finding.   02/07/2016 Imaging   Targeted ultrasound of the left breast was performed demonstrating an irregular shadowing hypoechoic mass at 1 o'clock 5 cm from nipple measuring approximately 2.3 x 1 x 1.6 cm. This corresponds well with mammography findings. No lymphadenopathy seen in the left axilla.   02/07/2016 Mammogram   Note that the patient has a stable oval circumscribed mass in the retroareolar left breast previously characterized as a cyst. Although this cyst appears stable when compared to 2016 it is overall increasing in size and therefore given the patient's highly suspicious mass in the upper-outer left breast aspiration of the retroareolar left breast cyst is warranted.   02/07/2016 Procedure   She had US guided biopsy of breast mass   02/07/2016 Pathology Results   Accession: [id number]: Biopsy confirmed invasive breast cancer, ER 10% positive, PR 10% positive and Her2/neu negative, Ki 67 15%  Additional molecular study with MammaPrint result came back low risk (10% risk of recurrence in 10 years)   02/27/2016 Imaging   MR breast showed area of known malignancy in the upper-outer quadrant of the left breast, measuring maximum of 4.0 cm (anterior-posterior) an associated clip artifact. No findings to indicate multicentric, multifocal, or contralateral malignancy. No MRI evidence for adenopathy   03/03/2016 Surgery   She underwent left partial mastectomy and sentinel lymph node biopsy   03/03/2016 Pathology Results   CASE: [id number] Invasive lobular carcinoma 2.3 cm, Histologic Grade (Nottingham Histologic Score) Glandular (Acinar)/Tubular Differentiation: Score 3 Nuclear Pleomorphism: Score 2 Mitotic Rate: Score 1 Total score: 6  Overall Grade: Grade 2 Tumor Size: 23 mm. Size of lymph node invasion is 2.5 mm    Patient completed 5 years of endocrine therapy in November 2023    Interval history-occasional pain at the lumpectomy site but is otherwise doing well without any significant side effects.  Denies any new aches and pains anywhere  ECOG PS- 1 Pain scale- 0  Review of systems- Review of Systems  Constitutional:  Negative for chills, fever, malaise/fatigue and weight loss.  HENT:  Negative for congestion, ear discharge and nosebleeds.   Eyes:  Negative for blurred vision.  Respiratory:  Negative for cough, hemoptysis, sputum production, shortness of breath and wheezing.   Cardiovascular:  Negative for chest pain, palpitations, orthopnea and claudication.  Gastrointestinal:  Negative for abdominal pain, blood in stool, constipation, diarrhea, heartburn, melena, nausea and vomiting.  Genitourinary:  Negative for dysuria, flank pain, frequency, hematuria and urgency.  Musculoskeletal:  Negative for back pain, joint pain and myalgias.  Skin:  Negative for rash.  Neurological:  Negative for dizziness, tingling, focal weakness, seizures, weakness and headaches.  Endo/Heme/Allergies:  Does not bruise/bleed easily.  Psychiatric/Behavioral:  Negative for depression and suicidal ideas. The patient does not have insomnia.       Allergies  Allergen Reactions   Penicillins Anaphylaxis    REACTION: severe anaphylaxis Has patient had a PCN reaction causing immediate rash, facial/tongue/throat swelling, SOB or lightheadedness with hypotension: Yes Has patient had a PCN reaction causing severe rash involving mucus membranes or skin necrosis: No Has patient had a PCN reaction that required hospitalization No Has patient had a PCN reaction occurring within the last 10 years: No If all of the above answers are "NO", then may proceed with Cephalosporin use.    Zithromax [Azithromycin] Rash    z-pack = rash   Anastrozole Other (See Comments)    Extreme sweating   Cefuroxime Axetil Other (See Comments)    PCN allergy   Codeine Other (See Comments)    unknown    Erythromycin Other (See Comments)    unknown   Nitrofurantoin Other (See Comments)    unknown   Ofloxacin Other (See Comments)    unknown   Prednisone Other (See Comments)   Propoxyphene Other (See Comments)   Tetracycline Other (See Comments)    unknown   Sulfa Antibiotics Other (See Comments) and Rash     Past Medical History:  Diagnosis Date   Anxiety    Asthma    Breast cancer (HCC) 02/07/2016   left breast invasive lobular carcinoma   CAD (coronary artery disease)    mild CAD by cath in 2001   Depression    Diverticulosis    Heart murmur    HTN (hypertension)    Hyperlipidemia    Palpitations    Personal history of radiation therapy 2018   left breast ca   Squamous cell carcinoma of skin 12/09/2017   R lat base of neck, SCCIS   Syncope    Pre-syncope     Past Surgical History:  Procedure Laterality Date   ABDOMINAL HYSTERECTOMY     BREAST BIOPSY Left 02/07/2016   grade II invasive and in situ mammary carcinoma   BREAST BIOPSY Right 03/31/2023   stereo bx, asymmetry, COIL clip-path pending   BREAST BIOPSY Right 03/31/2023   u/s core axilla butterflu clip path pending   BREAST BIOPSY Right 03/31/2023   MM RT BREAST BX W LOC DEV 1ST LESION IMAGE BX SPEC STEREO GUIDE 03/31/2023 [facility]  BREAST CYST ASPIRATION Left 02/13/2016   cyst   BREAST LUMPECTOMY Left 03/03/2016   invasive lobular carcinoma, clear margins, METASTATIC LOBULAR CARCINOMA, 2.5 MM, IN ONE LYMPH NODE (1/1).    CHOLECYSTECTOMY     KNEE ARTHROSCOPY Right    PARTIAL MASTECTOMY WITH NEEDLE LOCALIZATION Left 03/03/2016   Procedure: PARTIAL MASTECTOMY WITH NEEDLE LOCALIZATION;  Surgeon: Nadeen Landau, MD;  Location: [facility];  Service: General;  Laterality: Left;   SENTINEL NODE BIOPSY Left 03/03/2016   Procedure: SENTINEL NODE BIOPSY;  Surgeon: Nadeen Landau, MD;  Location: [facility];  Service: General;  Laterality: Left;   TUBAL LIGATION      Social History    Socioeconomic History   Marital status: Divorced    Spouse name: Not on file   Number of children: Not on file   Years of education: Not on file   Highest education level: Not on file  Occupational History   Not on file  Tobacco Use   Smoking status: Never   Smokeless tobacco: Never  Substance and Sexual Activity   Alcohol use: No    Alcohol/week: 0.0 standard drinks of alcohol   Drug use: No   Sexual activity: Not on file  Other Topics Concern   Not on file  Social History Narrative   Divorced.     No kids    Prev worked assembly work.  Retired at age 34.    Social Drivers of Corporate investment banker Strain: Low Risk  (12/16/2022)   Overall Financial Resource Strain (CARDIA)    Difficulty of Paying Living Expenses: Not hard at all  Food Insecurity: No Food Insecurity (12/16/2022)   Hunger Vital Sign    Worried About Running Out of Food in the Last Year: Never true    Ran Out of Food in the Last Year: Never true  Transportation Needs: No Transportation Needs (12/16/2022)   PRAPARE - Administrator, Civil Service (Medical): No    Lack of Transportation (Non-Medical): No  Physical Activity: Insufficiently Active (12/16/2022)   Exercise Vital Sign    Days of Exercise per Week: 2 days    Minutes of Exercise per Session: 30 min  Stress: No Stress Concern Present (12/16/2022)   [employer] of Occupational Health - Occupational Stress Questionnaire    Feeling of Stress : Not at all  Social Connections: Moderately Isolated (12/16/2022)   Social Connection and Isolation Panel [NHANES]    Frequency of Communication with Friends and Family: More than three times a week    Frequency of Social Gatherings with Friends and Family: More than three times a week    Attends Religious Services: More than 4 times per year    Active Member of [employer] or Organizations: No    Attends Banker Meetings: Never    Marital Status: Divorced  Catering manager Violence: Not  At Risk (12/16/2022)   Humiliation, Afraid, Rape, and Kick questionnaire    Fear of Current or Ex-Partner: No    Emotionally Abused: No    Physically Abused: No    Sexually Abused: No    Family History  Problem Relation Age of Onset   Cancer Father        oral cancer   Cancer Sister        colon cancer and lymphoma   Colon cancer Sister    Breast cancer Neg Hx      Current Outpatient Medications:    acetaminophen (TYLENOL) 500 MG  tablet, Take 1,000 mg by mouth every 8 (eight) hours as needed for mild pain (pain score 1-3)., Disp: , Rfl:    albuterol (VENTOLIN HFA) 108 (90 Base) MCG/ACT inhaler, Inhale into the lungs every 6 (six) hours as needed for wheezing or shortness of breath., Disp: , Rfl:    ALPRAZolam (XANAX) 0.5 MG tablet, TAKE ONE HALF (1/2) TABLET BY MOUTH IN THE MORNING AND ONE TABLET BY MOUTH IN THE EVENING IF NEEDED. SEDATION CAUTION, Disp: 45 tablet, Rfl: 1   amLODipine (NORVASC) 2.5 MG tablet, Take 1 tablet (2.5 mg total) by mouth daily., Disp: 90 tablet, Rfl: 3   Cholecalciferol (VITAMIN D3) 50 MCG (2000 UT) capsule, Take 2 capsules (4,000 Units total) by mouth daily., Disp: , Rfl:    cyanocobalamin (VITAMIN B12) 1000 MCG tablet, Take 1 tablet (1,000 mcg total) by mouth daily., Disp: , Rfl:    diclofenac (VOLTAREN) 50 MG EC tablet, TAKE ONE TABLET BY MOUTH ONCE A DAY AS NEEDED WITH FOOD., Disp: 90 tablet, Rfl: 1   escitalopram (LEXAPRO) 20 MG tablet, TAKE ONE TABLET BY MOUTH ONCE A DAY, Disp: 90 tablet, Rfl: 1   hydrochlorothiazide (HYDRODIURIL) 25 MG tablet, TAKE ONE TABLET BY MOUTH ONCE A DAY, Disp: 90 tablet, Rfl: 3   lactulose, encephalopathy, (CHRONULAC) 10 GM/15ML SOLN, Take by mouth., Disp: , Rfl:    loperamide (IMODIUM A-D) 2 MG tablet, Take 1 tablet (2 mg total) by mouth 4 (four) times daily as needed for diarrhea or loose stools., Disp: , Rfl:    melatonin 5 MG TABS, Take 1 tablet (5 mg total) by mouth at bedtime as needed., Disp: , Rfl:    montelukast  (SINGULAIR) 10 MG tablet, TAKE ONE TABLET BY MOUTH ONCE A DAY, Disp: 90 tablet, Rfl: 3   Na Sulfate-K Sulfate-Mg Sulfate concentrate (SUPREP) 17.5-3.13-1.6 GM/177ML SOLN, Take by mouth., Disp: , Rfl:    nystatin powder, APPLY 1 APPLICATION TOPICALLY 3 TIMES DAILY, Disp: 60 g, Rfl: 3   rosuvastatin (CRESTOR) 10 MG tablet, Take 1 tablet (10 mg total) by mouth at bedtime., Disp: 90 tablet, Rfl: 3   traZODone (DESYREL) 50 MG tablet, TAKE 0.5 TABLETS (25 MG TOTAL) BY MOUTH AT BEDTIME AS NEEDED FOR SLEEP. (Patient not taking: Reported on 07/09/2023), Disp: 45 tablet, Rfl: 1  Physical exam:  Vitals:   07/09/23 1110  BP: (!) 110/53  Pulse: 78  Resp: 18  Temp: (!) 97.3 F (36.3 C)  TempSrc: Tympanic  SpO2: 98%  Weight: 191 lb 6.4 oz (86.8 kg)  Height: 5\' 3"  (1.6 m)   Physical Exam Cardiovascular:     Rate and Rhythm: Normal rate and regular rhythm.     Heart sounds: Normal heart sounds.  Pulmonary:     Effort: Pulmonary effort is normal.     Breath sounds: Normal breath sounds.  Skin:    General: Skin is warm and dry.  Neurological:     Mental Status: She is alert and oriented to person, place, and time.    Breast exam was performed in seated and lying down position. Patient is status post left lumpectomy with a well-healed surgical scar. No evidence of any palpable masses. No evidence of axillary adenopathy. No evidence of any palpable masses or lumps in the right breast. No evidence of right axillary adenopathy      Latest Ref Rng & Units 06/25/2023    9:44 AM  CMP  Glucose 70 - 99 mg/dL 409   BUN 6 - 23 mg/dL 22  Creatinine 0.40 - 1.20 mg/dL 9.62   Sodium 952 - 841 mEq/L 135   Potassium 3.5 - 5.1 mEq/L 4.2   Chloride 96 - 112 mEq/L 95   CO2 19 - 32 mEq/L 29   Calcium 8.4 - 10.5 mg/dL 9.7   Total Protein 6.0 - 8.3 g/dL 7.4   Total Bilirubin 0.2 - 1.2 mg/dL 1.2   Alkaline Phos 39 - 117 U/L 97   AST 0 - 37 U/L 28   ALT 0 - 35 U/L 23       Latest Ref Rng & Units 06/25/2023     9:44 AM  CBC  WBC 4.0 - 10.5 K/uL 7.6   Hemoglobin 12.0 - 15.0 g/dL 32.4   Hematocrit 40.1 - 46.0 % 36.1   Platelets 150.0 - 400.0 K/uL 209.0     Assessment and plan- Patient is a 78 y.o. female  with h/o Stage IIb pT2N1a(sn)cM0 invasive lobular carcinoma of the left breast ER 10% positive, PR 10% positive, HER-2/neu negative status post lumpectomy/SLND.  She completed 5 years of endocrine therapy and this is a routine follow-up visit  Clinically patient is doing well with no concerning signs and symptoms of recurrence based on today's exam.  I will see her on a yearly basis and arrange her mammogram for December 2025.  Patient had a mammogram in December 2024 she had to undergo diagnostic mammogram and ultrasound for right breast asymmetry which was subsequently biopsied and was negative for any atypia or malignancy.  Pseudoangiomatous stromal hyperplasia and sclerosing adenosis was noted.   Visit Diagnosis 1. Encounter for follow-up surveillance of breast cancer      Dr. Owens Shark, MD, MPH [facility] [phone] 07/09/2023 2:49 PM

## 2023-07-10 ENCOUNTER — Other Ambulatory Visit: Payer: Self-pay | Admitting: Family Medicine

## 2023-07-10 DIAGNOSIS — C50412 Malignant neoplasm of upper-outer quadrant of left female breast: Secondary | ICD-10-CM

## 2023-07-12 ENCOUNTER — Other Ambulatory Visit (INDEPENDENT_AMBULATORY_CARE_PROVIDER_SITE_OTHER): Payer: Self-pay

## 2023-07-12 ENCOUNTER — Encounter: Payer: Self-pay | Admitting: Family Medicine

## 2023-07-12 ENCOUNTER — Telehealth (INDEPENDENT_AMBULATORY_CARE_PROVIDER_SITE_OTHER): Payer: Self-pay | Admitting: Vascular Surgery

## 2023-07-12 MED ORDER — ALPRAZOLAM 0.5 MG PO TABS: ORAL_TABLET | ORAL | 0 refills | Status: DC

## 2023-07-12 NOTE — Telephone Encounter (Signed)
 sent

## 2023-07-12 NOTE — Telephone Encounter (Signed)
 Patient has a scheduled laser ablation procedure with Dr. Wyn Quaker on 5.2.25. She will need the standard protocol RX sent in to pharmacy on file. Thank you!

## 2023-07-13 NOTE — Telephone Encounter (Signed)
 LOV: 06/25/23 NOV: NOTHING SCHEDULED LAST REFILL: ALPRAZOLAM 0.5MG  TABLET 05/13/23 45 tablets 1 refill

## 2023-07-21 DIAGNOSIS — H6123 Impacted cerumen, bilateral: Secondary | ICD-10-CM | POA: Diagnosis not present

## 2023-07-21 DIAGNOSIS — H902 Conductive hearing loss, unspecified: Secondary | ICD-10-CM | POA: Diagnosis not present

## 2023-08-02 ENCOUNTER — Other Ambulatory Visit: Payer: Self-pay | Admitting: Family Medicine

## 2023-08-03 ENCOUNTER — Ambulatory Visit: Admission: RE | Admit: 2023-08-03 | Payer: Medicare HMO | Source: Home / Self Care | Admitting: Internal Medicine

## 2023-08-03 SURGERY — COLONOSCOPY WITH PROPOFOL
Anesthesia: General

## 2023-08-07 ENCOUNTER — Other Ambulatory Visit: Payer: Self-pay | Admitting: Family Medicine

## 2023-08-13 ENCOUNTER — Other Ambulatory Visit (INDEPENDENT_AMBULATORY_CARE_PROVIDER_SITE_OTHER): Admitting: Vascular Surgery

## 2023-08-19 ENCOUNTER — Other Ambulatory Visit: Payer: Self-pay | Admitting: Family Medicine

## 2023-08-20 ENCOUNTER — Encounter (INDEPENDENT_AMBULATORY_CARE_PROVIDER_SITE_OTHER)

## 2023-08-31 ENCOUNTER — Encounter (INDEPENDENT_AMBULATORY_CARE_PROVIDER_SITE_OTHER): Payer: Self-pay

## 2023-09-07 ENCOUNTER — Other Ambulatory Visit: Payer: Self-pay | Admitting: Family Medicine

## 2023-09-07 DIAGNOSIS — H6122 Impacted cerumen, left ear: Secondary | ICD-10-CM | POA: Diagnosis not present

## 2023-09-07 DIAGNOSIS — H60332 Swimmer's ear, left ear: Secondary | ICD-10-CM | POA: Diagnosis not present

## 2023-09-07 DIAGNOSIS — Z170 Estrogen receptor positive status [ER+]: Secondary | ICD-10-CM

## 2023-09-08 NOTE — Telephone Encounter (Signed)
 Sent. Thanks.

## 2023-09-10 ENCOUNTER — Ambulatory Visit (INDEPENDENT_AMBULATORY_CARE_PROVIDER_SITE_OTHER): Admitting: Vascular Surgery

## 2023-09-10 ENCOUNTER — Other Ambulatory Visit: Payer: Self-pay | Admitting: Family Medicine

## 2023-09-14 DIAGNOSIS — H60332 Swimmer's ear, left ear: Secondary | ICD-10-CM | POA: Diagnosis not present

## 2023-09-28 ENCOUNTER — Other Ambulatory Visit (INDEPENDENT_AMBULATORY_CARE_PROVIDER_SITE_OTHER)

## 2023-09-28 DIAGNOSIS — E559 Vitamin D deficiency, unspecified: Secondary | ICD-10-CM

## 2023-09-28 LAB — VITAMIN D 25 HYDROXY (VIT D DEFICIENCY, FRACTURES): VITD: 39.15 ng/mL (ref 30.00–100.00)

## 2023-09-30 ENCOUNTER — Ambulatory Visit: Payer: Self-pay | Admitting: Family Medicine

## 2023-10-14 ENCOUNTER — Telehealth: Payer: Self-pay | Admitting: Family Medicine

## 2023-10-14 NOTE — Telephone Encounter (Signed)
 Copied from CRM [id number]. Topic: General - Other >> Oct 14, 2023 11:01 AM Mesmerise C wrote: Reason for CRM: Patient has a jury duty summon on 8/18 and inquiring if Dr. Cleatus can write a note for her to be excused and can pick it up once it's ready if so patient can be reached at [phone] doesn't have access to My Chart

## 2023-10-17 NOTE — Telephone Encounter (Signed)
 Letter done for patient.  Please make sure it printed and then send to patient.  Thanks.

## 2023-10-18 NOTE — Telephone Encounter (Signed)
 Letter printed, an pt advised ready for pick up

## 2023-10-19 ENCOUNTER — Telehealth: Payer: Self-pay | Admitting: Family Medicine

## 2023-10-19 ENCOUNTER — Encounter: Payer: Self-pay | Admitting: Family Medicine

## 2023-10-19 ENCOUNTER — Ambulatory Visit (INDEPENDENT_AMBULATORY_CARE_PROVIDER_SITE_OTHER): Admitting: Family Medicine

## 2023-10-19 VITALS — BP 114/70 | HR 62 | Temp 98.5°F | Ht 63.0 in | Wt 192.0 lb

## 2023-10-19 DIAGNOSIS — M549 Dorsalgia, unspecified: Secondary | ICD-10-CM | POA: Diagnosis not present

## 2023-10-19 DIAGNOSIS — F419 Anxiety disorder, unspecified: Secondary | ICD-10-CM

## 2023-10-19 MED ORDER — DICLOFENAC SODIUM 50 MG PO TBEC: 50.0000 mg | DELAYED_RELEASE_TABLET | Freq: Two times a day (BID) | ORAL | 1 refills | Status: DC | PRN

## 2023-10-19 NOTE — Progress Notes (Unsigned)
 R lower back pain.  Tylenol  helped a little but didn't resolve the pain.  Pain leaning forward, reaching.  Can be a sharp pain and comes on quickly.  Can radiate across the lower back.  Pain lifting groceries.    She has baseline lower back pain from prior with new sharp episodic pain.  No radicular pain down the leg.  Sensation wnl BLE.    Xrays reviewed at [facility].    Taking voltaren  at baseline, daily.    D/w pt about anxiety and jury duty, she has a copy of the letter I wrote.  She can update me as needed.    Meds, vitals, and allergies reviewed.   ROS: Per HPI unless specifically indicated in ROS section   Normal hip int rotation.  S/S wnl.    Prev xray with  IMPRESSION: 1. No acute fracture or traumatic listhesis. 2. Mild multilevel degenerative disc disease and moderate facet arthropathy.

## 2023-10-19 NOTE — Patient Instructions (Addendum)
 Try tylenol  every 8 hours.  Try taking diclofenac  twice a day with food.  Let me know if you can't get set up with PT.  [facility] [address] [place], [place], [place] [address] [phone]  Take care.  Glad to see you.

## 2023-10-19 NOTE — Telephone Encounter (Signed)
 Patient came in to her 8:30 Appt and picked up her letter.

## 2023-10-20 NOTE — Assessment & Plan Note (Signed)
 Discussed options. Try tylenol  every 8 hours.  Try taking diclofenac  twice a day with food.  She can let me know if she can't get set up with PT.  [facility] [address] [place], [place], [place] [address] [phone]

## 2023-10-20 NOTE — Assessment & Plan Note (Signed)
 Discussed jury duty letter.  See above.  Continue Xanax  and Lexapro .

## 2023-11-03 ENCOUNTER — Other Ambulatory Visit: Payer: Self-pay | Admitting: Family Medicine

## 2023-11-03 DIAGNOSIS — Z170 Estrogen receptor positive status [ER+]: Secondary | ICD-10-CM

## 2023-11-03 NOTE — Telephone Encounter (Signed)
 LOV: 10/19/23 NOV: nothing scheduled Last Refill: ALPRAZolam  (XANAX ) 0.5 MG tablet  09/08/23 45 tablets 1 refill

## 2023-11-07 NOTE — Telephone Encounter (Signed)
 Sent. Thanks.

## 2023-11-22 ENCOUNTER — Other Ambulatory Visit: Payer: Self-pay | Admitting: Family Medicine

## 2023-11-26 ENCOUNTER — Encounter: Payer: Self-pay | Admitting: Internal Medicine

## 2023-12-07 ENCOUNTER — Ambulatory Visit: Admission: RE | Admit: 2023-12-07 | Source: Home / Self Care | Admitting: Internal Medicine

## 2023-12-07 HISTORY — DX: Atherosclerotic heart disease of native coronary artery without angina pectoris: I25.10

## 2023-12-07 HISTORY — DX: Other constipation: K59.09

## 2023-12-07 HISTORY — DX: Occlusion and stenosis of right carotid artery: I65.21

## 2023-12-07 HISTORY — DX: Unspecified hemorrhoids: K64.9

## 2023-12-07 SURGERY — COLONOSCOPY
Anesthesia: General

## 2023-12-09 DIAGNOSIS — Z0100 Encounter for examination of eyes and vision without abnormal findings: Secondary | ICD-10-CM | POA: Diagnosis not present

## 2023-12-09 DIAGNOSIS — H35033 Hypertensive retinopathy, bilateral: Secondary | ICD-10-CM | POA: Diagnosis not present

## 2023-12-14 ENCOUNTER — Ambulatory Visit (INDEPENDENT_AMBULATORY_CARE_PROVIDER_SITE_OTHER)

## 2023-12-14 VITALS — Ht 64.0 in | Wt 192.0 lb

## 2023-12-14 DIAGNOSIS — Z0000 Encounter for general adult medical examination without abnormal findings: Secondary | ICD-10-CM

## 2023-12-14 NOTE — Patient Instructions (Signed)
 Jamie Curry , Thank you for taking time out of your busy schedule to complete your Annual Wellness Visit with me. I enjoyed our conversation and look forward to speaking with you again next year. I, as well as your care team,  appreciate your ongoing commitment to your health goals. Please review the following plan we discussed and let me know if I can assist you in the future. Your Game plan/ To Do List    Referrals: If you haven't heard from the office you've been referred to, please reach out to them at the phone provided.   Follow up Visits: We will see or speak with you next year for your Next Medicare AWV with our clinical staff Have you seen your provider in the last 6 months (3 months if uncontrolled diabetes)? Yes  Clinician Recommendations:  Aim for 30 minutes of exercise or brisk walking, 6-8 glasses of water, and 5 servings of fruits and vegetables each day.       This is a list of the screenings recommended for you:  Health Maintenance  Topic Date Due   Hepatitis C Screening  Never done   Pneumococcal Vaccine for age over 68 (2 of 2 - PPSV23, PCV20, or PCV21) 09/19/2014   DTaP/Tdap/Td vaccine (2 - Td or Tdap) 12/14/2018   COVID-19 Vaccine (3 - Pfizer risk series) 11/04/2019   Flu Shot  11/12/2023   Medicare Annual Wellness Visit  12/13/2024   DEXA scan (bone density measurement)  Completed   Zoster (Shingles) Vaccine  Completed   HPV Vaccine  Aged Out   Meningitis B Vaccine  Aged Out    Advanced directives: (ACP Link)Information on Advanced Care Planning can be found at [place]  Secretary of [facility] Advance Health Care Directives Advance Health Care Directives. [email or web address]  Advance Care Planning is important because it:  [x]  Makes sure you receive the medical care that is consistent with your values, goals, and preferences  [x]  It provides guidance to your family and loved ones and reduces their decisional burden about whether or not they are making the right decisions  based on your wishes.  Follow the link provided in your after visit summary or read over the paperwork we have mailed to you to help you started getting your Advance Directives in place. If you need assistance in completing these, please reach out to us  so that we can help you!

## 2023-12-14 NOTE — Progress Notes (Signed)
 Subjective:   Jamie Curry is a 78 y.o. who presents for a Medicare Wellness preventive visit.  As a reminder, Annual Wellness Visits don't include a physical exam, and some assessments may be limited, especially if this visit is performed virtually. We may recommend an in-person follow-up visit with your provider if needed.  Visit Complete: Virtual I connected with  Jamie Curry on 12/14/23 by a audio enabled telemedicine application and verified that I am speaking with the correct person using two identifiers.  Patient Location: Home  Provider Location: Office/Clinic  I discussed the limitations of evaluation and management by telemedicine. The patient expressed understanding and agreed to proceed.  Vital Signs: Because this visit was a virtual/telehealth visit, some criteria may be missing or patient reported. Any vitals not documented were not able to be obtained and vitals that have been documented are patient reported.  VideoDeclined- This patient declined Librarian, academic. Therefore the visit was completed with audio only.  Persons Participating in Visit: Patient.  AWV Questionnaire: No: Patient Medicare AWV questionnaire was not completed prior to this visit.  Cardiac Risk Factors include: advanced age (>61men, >36 women);dyslipidemia;hypertension;sedentary lifestyle;obesity (BMI >30kg/m2)     Objective:    Today's Vitals   12/14/23 0936 12/14/23 0937  Weight: 192 lb (87.1 kg)   Height: 5' 4 (1.626 m)   PainSc:  3    Body mass index is 32.96 kg/m.     12/14/2023   10:00 AM 07/09/2023   11:05 AM 12/16/2022    9:49 AM 07/08/2022   10:27 AM 06/03/2022   11:21 AM 12/12/2021    8:52 AM 07/07/2021   11:15 AM  Advanced Directives  Does Patient Have a Medical Advance Directive? No No No Yes No No No  Type of Diplomatic Services operational officer;Living will   Healthcare Power of Nikolai;Living will     Copy of Healthcare Power of  Attorney in Chart? No - copy requested        Would patient like information on creating a medical advance directive?  No - Patient declined No - Patient declined  Yes (MAU/Ambulatory/Procedural Areas - Information given) No - Patient declined No - Patient declined    Current Medications (verified) Outpatient Encounter Medications as of 12/14/2023  Medication Sig   acetaminophen  (TYLENOL ) 500 MG tablet Take 1,000 mg by mouth every 8 (eight) hours as needed for mild pain (pain score 1-3).   albuterol (VENTOLIN HFA) 108 (90 Base) MCG/ACT inhaler Inhale into the lungs every 6 (six) hours as needed for wheezing or shortness of breath.   ALPRAZolam  (XANAX ) 0.5 MG tablet TAKE ONE HALF (1/2) TABLET BY MOUTH IN THE MORNING AND ONE TABLET BY MOUTH IN THE EVENING IF NEEDED. SEDATION CAUTION   amLODipine  (NORVASC ) 2.5 MG tablet TAKE ONE TABLET (2.5 MG TOTAL) BY MOUTH DAILY.   Cholecalciferol (VITAMIN D3) 50 MCG (2000 UT) capsule Take 2 capsules (4,000 Units total) by mouth daily.   cyanocobalamin  (VITAMIN B12) 1000 MCG tablet Take 1 tablet (1,000 mcg total) by mouth daily.   diclofenac  (VOLTAREN ) 50 MG EC tablet Take 1 tablet (50 mg total) by mouth 2 (two) times daily as needed (for pain). With food.   escitalopram  (LEXAPRO ) 20 MG tablet TAKE ONE TABLET BY MOUTH ONCE A DAY   hydrochlorothiazide (HYDRODIURIL) 25 MG tablet TAKE ONE TABLET BY MOUTH ONCE A DAY   lactulose, encephalopathy, (CHRONULAC) 10 GM/15ML SOLN Take by mouth.   loperamide  (IMODIUM  A-D) 2  MG tablet Take 1 tablet (2 mg total) by mouth 4 (four) times daily as needed for diarrhea or loose stools.   melatonin 5 MG TABS Take 1 tablet (5 mg total) by mouth at bedtime as needed.   montelukast  (SINGULAIR ) 10 MG tablet TAKE ONE TABLET BY MOUTH ONCE A DAY   Na Sulfate-K Sulfate-Mg Sulfate concentrate (SUPREP) 17.5-3.13-1.6 GM/177ML SOLN Take by mouth.   nystatin  powder APPLY 1 APPLICATION TOPICALLY 3 TIMES DAILY   rosuvastatin  (CRESTOR ) 10 MG tablet  Take 1 tablet (10 mg total) by mouth at bedtime.   traZODone  (DESYREL ) 50 MG tablet TAKE 0.5 TABLETS (25 MG TOTAL) BY MOUTH AT BEDTIME AS NEEDED FOR SLEEP.   No facility-administered encounter medications on file as of 12/14/2023.    Allergies (verified) Penicillins, Zithromax [azithromycin], Anastrozole , Cefuroxime axetil, Codeine, Erythromycin, Nitrofurantoin, Ofloxacin, Prednisone, Propoxyphene, Tetracycline, and Sulfa antibiotics   History: Past Medical History:  Diagnosis Date   Anxiety    Asthma    Atherosclerotic heart disease of native coronary artery without angina pectoris    Breast cancer (HCC) 02/07/2016   left breast invasive lobular carcinoma   CAD (coronary artery disease)    mild CAD by cath in 2001   Constipation, chronic    Depression    Diverticulosis    Heart murmur    Hemorrhoids    HTN (hypertension)    Hyperlipidemia    Occlusion and stenosis of right carotid artery    Palpitations    Personal history of radiation therapy 2018   left breast ca   Squamous cell carcinoma of skin 12/09/2017   R lat base of neck, SCCIS   Syncope    Pre-syncope   Past Surgical History:  Procedure Laterality Date   ABDOMINAL HYSTERECTOMY     BREAST BIOPSY Left 02/07/2016   grade II invasive and in situ mammary carcinoma   BREAST BIOPSY Right 03/31/2023   stereo bx, asymmetry, COIL clip-path pending   BREAST BIOPSY Right 03/31/2023   u/s core axilla butterflu clip path pending   BREAST BIOPSY Right 03/31/2023   MM RT BREAST BX W LOC DEV 1ST LESION IMAGE BX SPEC STEREO GUIDE 03/31/2023 [facility]   BREAST CYST ASPIRATION Left 02/13/2016   cyst   BREAST LUMPECTOMY Left 03/03/2016   invasive lobular carcinoma, clear margins, METASTATIC LOBULAR CARCINOMA, 2.5 MM, IN ONE LYMPH NODE (1/1).    CHOLECYSTECTOMY     KNEE ARTHROSCOPY Right    MASTECTOMY     PARTIAL MASTECTOMY WITH NEEDLE LOCALIZATION Left 03/03/2016   Procedure: PARTIAL MASTECTOMY WITH NEEDLE  LOCALIZATION;  Surgeon: Larinda Unknown Sharps, MD;  Location: [facility];  Service: General;  Laterality: Left;   SENTINEL NODE BIOPSY Left 03/03/2016   Procedure: SENTINEL NODE BIOPSY;  Surgeon: Larinda Unknown Sharps, MD;  Location: [facility];  Service: General;  Laterality: Left;   TUBAL LIGATION     Family History  Problem Relation Age of Onset   Cancer Father        oral cancer   Cancer Sister        colon cancer and lymphoma   Colon cancer Sister    Breast cancer Neg Hx    Social History   Socioeconomic History   Marital status: Divorced    Spouse name: Not on file   Number of children: Not on file   Years of education: Not on file   Highest education level: Not on file  Occupational History   Not on file  Tobacco Use  Smoking status: Never   Smokeless tobacco: Never  Vaping Use   Vaping status: Never Used  Substance and Sexual Activity   Alcohol  use: No    Alcohol /week: 0.0 standard drinks of alcohol    Drug use: No   Sexual activity: Not on file  Other Topics Concern   Not on file  Social History Narrative   Divorced.     No kids    Prev worked assembly work.  Retired at age 7.    Social Drivers of Corporate investment banker Strain: Low Risk  (12/14/2023)   Overall Financial Resource Strain (CARDIA)    Difficulty of Paying Living Expenses: Not hard at all  Food Insecurity: No Food Insecurity (12/14/2023)   Hunger Vital Sign    Worried About Running Out of Food in the Last Year: Never true    Ran Out of Food in the Last Year: Never true  Transportation Needs: No Transportation Needs (12/14/2023)   PRAPARE - Administrator, Civil Service (Medical): No    Lack of Transportation (Non-Medical): No  Physical Activity: Inactive (12/14/2023)   Exercise Vital Sign    Days of Exercise per Week: 0 days    Minutes of Exercise per Session: 0 min  Stress: No Stress Concern Present (12/14/2023)   [employer] of Occupational Health - Occupational Stress  Questionnaire    Feeling of Stress: Only a little  Social Connections: Moderately Isolated (12/14/2023)   Social Connection and Isolation Panel    Frequency of Communication with Friends and Family: Twice a week    Frequency of Social Gatherings with Friends and Family: Once a week    Attends Religious Services: More than 4 times per year    Active Member of [employer] or Organizations: No    Attends Engineer, structural: Never    Marital Status: Divorced    Tobacco Counseling Counseling given: Not Answered    Clinical Intake:  Pre-visit preparation completed: Yes  Pain : 0-10 Pain Score: 3  Pain Type: Chronic pain Pain Location: Back Pain Orientation: Lower Pain Descriptors / Indicators: Aching Pain Onset: More than a month ago Pain Frequency: Intermittent Pain Relieving Factors: tylenol  Effect of Pain on Daily Activities: rest back when hurts  Pain Relieving Factors: tylenol   BMI - recorded: 32.96 Nutritional Status: BMI > 30  Obese Nutritional Risks: None Diabetes: No  Lab Results  Component Value Date   HGBA1C (H) 06/18/2010    6.0 (NOTE)                                                                       According to the ADA Clinical Practice Recommendations for 2011, when HbA1c is used as a screening test:   >=6.5%   Diagnostic of Diabetes Mellitus           (if abnormal result  is confirmed)  5.7-6.4%   Increased risk of developing Diabetes Mellitus  References:Diagnosis and Classification of Diabetes Mellitus,Diabetes Care,2011,34(Suppl 1):S62-S69 and Standards of Medical Care in         Diabetes - 2011,Diabetes Care,2011,34  (Suppl 1):S11-S61.     How often do you need to have someone help you when you read instructions, pamphlets, or other written materials  from your doctor or pharmacy?: 1 - Never  Interpreter Needed?: No  Comments: lives alone Information entered by :: B.Dorse Locy,LPN   Activities of Daily Living     12/14/2023   10:00 AM  12/16/2022    9:50 AM  In your present state of health, do you have any difficulty performing the following activities:  Hearing? 0 0  Vision? 0 1  Comment  Needs new rx has exam schedule  Difficulty concentrating or making decisions? 0 0  Walking or climbing stairs? 0 0  Dressing or bathing? 0 0  Doing errands, shopping? 0 0  Preparing Food and eating ? N N  Using the Toilet? N N  In the past six months, have you accidently leaked urine? N N  Do you have problems with loss of bowel control? N N  Managing your Medications? N N  Managing your Finances? N N  Housekeeping or managing your Housekeeping? N N    Patient Care Team: Cleatus Arlyss RAMAN, MD as PCP - General (Family Medicine) Fate Morna SAILOR, [facility] (Inactive) as Pharmacist (Pharmacist) Carolee Manus DASEN., MD (Ophthalmology)  I have updated your Care Teams any recent Medical Services you may have received from other providers in the past year.     Assessment:   This is a routine wellness examination for Jamie Curry.  Hearing/Vision screen Hearing Screening - Comments:: Patient denies any hearing difficulties.   Vision Screening - Comments:: Pt says their vision is good with without glasses Dr     Goals Addressed             This Visit's Progress    DIET - EAT MORE FRUITS AND VEGETABLES       12/14/23     Patient Stated   On track    12/14/23-Socialize more, increase exercise.       Depression Screen     12/14/2023    9:44 AM 10/19/2023    8:25 AM 06/25/2023    9:01 AM 03/26/2023   11:39 AM 03/02/2023    3:02 PM 12/16/2022    9:44 AM 09/22/2022    9:37 AM  PHQ 2/9 Scores  PHQ - 2 Score 2 2 1 2 1  0 0  PHQ- 9 Score 6 5 5 6 5  1     Fall Risk     12/14/2023    9:39 AM 10/19/2023    8:25 AM 06/25/2023    9:02 AM 03/26/2023   11:38 AM 03/02/2023    3:02 PM  Fall Risk   Falls in the past year? 0 0 0 0 0  Number falls in past yr: 0 0 0 0 0  Injury with Fall? 0 0 0 0 0  Risk for fall due to : No Fall Risks No Fall Risks  No Fall Risks No Fall Risks No Fall Risks  Follow up Education provided;Falls prevention discussed Falls evaluation completed Falls evaluation completed Falls evaluation completed Falls evaluation completed    MEDICARE RISK AT HOME:  Medicare Risk at Home Any stairs in or around the home?: No If so, are there any without handrails?: Yes Home free of loose throw rugs in walkways, pet beds, electrical cords, etc?: Yes Adequate lighting in your home to reduce risk of falls?: Yes Life alert?: No Use of a cane, walker or w/c?: No Grab bars in the bathroom?: No Shower chair or bench in shower?: No Elevated toilet seat or a handicapped toilet?: No  TIMED UP AND GO:  Was the test  performed?  No  Cognitive Function: 6CIT completed        12/14/2023   10:02 AM 12/16/2022    9:52 AM 12/12/2021    8:54 AM  6CIT Screen  What Year? 0 points 0 points 0 points  What month? 0 points 0 points 0 points  What time? 0 points 0 points 0 points  Count back from 20 0 points 0 points 0 points  Months in reverse 0 points 0 points 0 points  Repeat phrase 0 points 2 points 2 points  Total Score 0 points 2 points 2 points    Immunizations Immunization History  Administered Date(s) Administered   Fluad Quad(high Dose 65+) 12/28/2018   INFLUENZA, HIGH DOSE SEASONAL PF 01/07/2016, 01/06/2018, 12/28/2018, 01/15/2022   Influenza Split 12/19/2009, 12/29/2010, 01/06/2012, 01/16/2013, 01/25/2014   Influenza,inj,Quad PF,6+ Mos 01/16/2013, 01/25/2014, 01/03/2015, 01/12/2017   Influenza-Unspecified 01/01/2016   PFIZER(Purple Top)SARS-COV-2 Vaccination 09/16/2019, 10/07/2019   Pneumococcal Conjugate-13 07/25/2014   Pneumococcal-Unspecified 12/19/2009, 12/29/2010, 01/16/2013   Tdap 12/13/2008   Zoster Recombinant(Shingrix) 01/26/2017, 04/27/2017   Zoster, Live 12/19/2009, 04/27/2017    Screening Tests Health Maintenance  Topic Date Due   Hepatitis C Screening  Never done   Pneumococcal Vaccine: 50+ Years  (2 of 2 - PPSV23, PCV20, or PCV21) 09/19/2014   DTaP/Tdap/Td (2 - Td or Tdap) 12/14/2018   COVID-19 Vaccine (3 - Pfizer risk series) 11/04/2019   INFLUENZA VACCINE  11/12/2023   Medicare Annual Wellness (AWV)  12/13/2024   DEXA SCAN  Completed   Zoster Vaccines- Shingrix  Completed   HPV VACCINES  Aged Out   Meningococcal B Vaccine  Aged Out    Health Maintenance  Health Maintenance Due  Topic Date Due   Hepatitis C Screening  Never done   Pneumococcal Vaccine: 50+ Years (2 of 2 - PPSV23, PCV20, or PCV21) 09/19/2014   DTaP/Tdap/Td (2 - Td or Tdap) 12/14/2018   COVID-19 Vaccine (3 - Pfizer risk series) 11/04/2019   INFLUENZA VACCINE  11/12/2023   Health Maintenance Items Addressed: None due at this time. Pt will receive vaccines at their pharmacy when decided to obtain   Additional Screening:  Vision Screening: Recommended annual ophthalmology exams for early detection of glaucoma and other disorders of the eye. Would you like a referral to an eye doctor? No    Dental Screening: Recommended annual dental exams for proper oral hygiene  Community Resource Referral / Chronic Care Management: CRR required this visit?  No   CCM required this visit?  Appt scheduled with PCP   Plan:    I have personally reviewed and noted the following in the patient's chart:   Medical and social history Use of alcohol , tobacco or illicit drugs  Current medications and supplements including opioid prescriptions. Patient is not currently taking opioid prescriptions. Functional ability and status Nutritional status Physical activity Advanced directives List of other physicians Hospitalizations, surgeries, and ER visits in previous 12 months Vitals Screenings to include cognitive, depression, and falls Referrals and appointments  In addition, I have reviewed and discussed with patient certain preventive protocols, quality metrics, and best practice recommendations. A written personalized  care plan for preventive services as well as general preventive health recommendations were provided to patient.   Erminio LITTIE Saris, LPN   [id number]   After Visit Summary: (MyChart) Due to this being a telephonic visit, the after visit summary with patients personalized plan was offered to patient via MyChart   Notes: Nothing significant to report at this time.

## 2023-12-23 ENCOUNTER — Telehealth: Payer: Self-pay

## 2023-12-23 NOTE — Telephone Encounter (Signed)
 Copied from CRM [id number]. Topic: Clinical - Request for Lab/Test Order >> Dec 23, 2023 10:47 AM Carlyon D wrote: Reason for CRM: Pt is calling to see if she can get her yearly lab orders put in before her physical in October please call pt to schedule lab visit when orders are entered. Pt does not have MyChart.

## 2023-12-23 NOTE — Telephone Encounter (Signed)
 Please call patient to schedule her for a lab appointment prior to her physical. Once she is scheduled the lab will let Dr. Cleatus know and he will drop the orders. Thank you

## 2023-12-29 ENCOUNTER — Ambulatory Visit: Attending: Family Medicine | Admitting: Physical Therapy

## 2023-12-29 DIAGNOSIS — M549 Dorsalgia, unspecified: Secondary | ICD-10-CM | POA: Insufficient documentation

## 2023-12-29 DIAGNOSIS — G8929 Other chronic pain: Secondary | ICD-10-CM | POA: Insufficient documentation

## 2023-12-29 DIAGNOSIS — M25561 Pain in right knee: Secondary | ICD-10-CM | POA: Diagnosis not present

## 2023-12-29 DIAGNOSIS — M6281 Muscle weakness (generalized): Secondary | ICD-10-CM | POA: Diagnosis not present

## 2023-12-29 DIAGNOSIS — M5459 Other low back pain: Secondary | ICD-10-CM | POA: Diagnosis not present

## 2023-12-29 DIAGNOSIS — M25562 Pain in left knee: Secondary | ICD-10-CM | POA: Diagnosis not present
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
## 2023-12-29 NOTE — Progress Notes (Signed)
 Agree.  Thanks.  Arlyss EDISON Cleatus, MD 12/29/23  9:44 PM

## 2023-12-29 NOTE — Addendum Note (Signed)
 Addended by: THEOTIS TORIBIO PARAS on: 12/29/2023 09:32 PM   Modules accepted: Orders

## 2024-01-01 ENCOUNTER — Other Ambulatory Visit: Payer: Self-pay | Admitting: Family Medicine

## 2024-01-01 DIAGNOSIS — F419 Anxiety disorder, unspecified: Secondary | ICD-10-CM

## 2024-01-01 DIAGNOSIS — C50412 Malignant neoplasm of upper-outer quadrant of left female breast: Secondary | ICD-10-CM

## 2024-01-01 DIAGNOSIS — Z170 Estrogen receptor positive status [ER+]: Secondary | ICD-10-CM

## 2024-01-03 NOTE — Telephone Encounter (Signed)
 Name of Medication:  Alprazolam  Name of Pharmacy:  [facility] Last Fill or Written Date and Quantity:  12/06/23, #45 Last Office Visit and Type:  10/19/23, back pain Next Office Visit and Type:  02/03/24, annual exam  Last Controlled Substance Agreement Date:  none Last UDS:  none

## 2024-01-04 ENCOUNTER — Ambulatory Visit: Admitting: Physical Therapy

## 2024-01-04 ENCOUNTER — Encounter: Payer: Self-pay | Admitting: Physical Therapy

## 2024-01-04 DIAGNOSIS — M5459 Other low back pain: Secondary | ICD-10-CM | POA: Diagnosis not present

## 2024-01-04 DIAGNOSIS — M25562 Pain in left knee: Secondary | ICD-10-CM | POA: Diagnosis not present

## 2024-01-04 DIAGNOSIS — M25561 Pain in right knee: Secondary | ICD-10-CM | POA: Diagnosis not present

## 2024-01-04 DIAGNOSIS — M6281 Muscle weakness (generalized): Secondary | ICD-10-CM

## 2024-01-04 DIAGNOSIS — G8929 Other chronic pain: Secondary | ICD-10-CM | POA: Diagnosis not present

## 2024-01-04 DIAGNOSIS — M549 Dorsalgia, unspecified: Secondary | ICD-10-CM | POA: Diagnosis not present
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
## 2024-01-06 ENCOUNTER — Ambulatory Visit: Admitting: Physical Therapy

## 2024-01-06 DIAGNOSIS — M5459 Other low back pain: Secondary | ICD-10-CM

## 2024-01-06 DIAGNOSIS — M6281 Muscle weakness (generalized): Secondary | ICD-10-CM

## 2024-01-06 DIAGNOSIS — M25561 Pain in right knee: Secondary | ICD-10-CM | POA: Diagnosis not present

## 2024-01-06 DIAGNOSIS — G8929 Other chronic pain: Secondary | ICD-10-CM

## 2024-01-06 DIAGNOSIS — M549 Dorsalgia, unspecified: Secondary | ICD-10-CM | POA: Diagnosis not present

## 2024-01-06 DIAGNOSIS — M25562 Pain in left knee: Secondary | ICD-10-CM | POA: Diagnosis not present
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
## 2024-01-11 ENCOUNTER — Ambulatory Visit: Admitting: Physical Therapy

## 2024-01-11 ENCOUNTER — Encounter: Payer: Self-pay | Admitting: Physical Therapy

## 2024-01-11 DIAGNOSIS — M5459 Other low back pain: Secondary | ICD-10-CM

## 2024-01-11 DIAGNOSIS — G8929 Other chronic pain: Secondary | ICD-10-CM | POA: Diagnosis not present

## 2024-01-11 DIAGNOSIS — M25562 Pain in left knee: Secondary | ICD-10-CM | POA: Diagnosis not present

## 2024-01-11 DIAGNOSIS — M25561 Pain in right knee: Secondary | ICD-10-CM | POA: Diagnosis not present

## 2024-01-11 DIAGNOSIS — M6281 Muscle weakness (generalized): Secondary | ICD-10-CM

## 2024-01-11 DIAGNOSIS — M549 Dorsalgia, unspecified: Secondary | ICD-10-CM | POA: Diagnosis not present
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
## 2024-01-13 ENCOUNTER — Ambulatory Visit: Attending: Family Medicine | Admitting: Physical Therapy

## 2024-01-13 DIAGNOSIS — M5459 Other low back pain: Secondary | ICD-10-CM | POA: Diagnosis not present

## 2024-01-13 DIAGNOSIS — G8929 Other chronic pain: Secondary | ICD-10-CM | POA: Insufficient documentation

## 2024-01-13 DIAGNOSIS — M25561 Pain in right knee: Secondary | ICD-10-CM | POA: Diagnosis not present

## 2024-01-13 DIAGNOSIS — M6281 Muscle weakness (generalized): Secondary | ICD-10-CM | POA: Insufficient documentation

## 2024-01-13 DIAGNOSIS — M25562 Pain in left knee: Secondary | ICD-10-CM | POA: Insufficient documentation
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
## 2024-01-17 ENCOUNTER — Other Ambulatory Visit: Payer: Self-pay | Admitting: Family Medicine

## 2024-01-17 DIAGNOSIS — E785 Hyperlipidemia, unspecified: Secondary | ICD-10-CM

## 2024-01-17 DIAGNOSIS — I10 Essential (primary) hypertension: Secondary | ICD-10-CM

## 2024-01-17 DIAGNOSIS — E559 Vitamin D deficiency, unspecified: Secondary | ICD-10-CM

## 2024-01-18 ENCOUNTER — Encounter: Payer: Self-pay | Admitting: Physical Therapy

## 2024-01-18 ENCOUNTER — Ambulatory Visit: Admitting: Physical Therapy

## 2024-01-18 DIAGNOSIS — M6281 Muscle weakness (generalized): Secondary | ICD-10-CM

## 2024-01-18 DIAGNOSIS — M5459 Other low back pain: Secondary | ICD-10-CM | POA: Diagnosis not present

## 2024-01-18 DIAGNOSIS — M25561 Pain in right knee: Secondary | ICD-10-CM | POA: Diagnosis not present

## 2024-01-18 DIAGNOSIS — G8929 Other chronic pain: Secondary | ICD-10-CM | POA: Diagnosis not present

## 2024-01-18 DIAGNOSIS — M25562 Pain in left knee: Secondary | ICD-10-CM | POA: Diagnosis not present
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
## 2024-01-20 ENCOUNTER — Ambulatory Visit: Admitting: Physical Therapy

## 2024-01-20 ENCOUNTER — Other Ambulatory Visit: Payer: Self-pay | Admitting: Family Medicine

## 2024-01-20 DIAGNOSIS — M5459 Other low back pain: Secondary | ICD-10-CM

## 2024-01-20 DIAGNOSIS — M6281 Muscle weakness (generalized): Secondary | ICD-10-CM

## 2024-01-20 DIAGNOSIS — G8929 Other chronic pain: Secondary | ICD-10-CM | POA: Diagnosis not present

## 2024-01-20 DIAGNOSIS — M25561 Pain in right knee: Secondary | ICD-10-CM | POA: Diagnosis not present

## 2024-01-20 DIAGNOSIS — M25562 Pain in left knee: Secondary | ICD-10-CM | POA: Diagnosis not present
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
## 2024-01-24 ENCOUNTER — Encounter: Payer: Self-pay | Admitting: Physical Therapy

## 2024-01-24 ENCOUNTER — Ambulatory Visit: Admitting: Physical Therapy

## 2024-01-24 ENCOUNTER — Other Ambulatory Visit: Payer: Self-pay | Admitting: Oncology

## 2024-01-24 DIAGNOSIS — M5459 Other low back pain: Secondary | ICD-10-CM

## 2024-01-24 DIAGNOSIS — M25562 Pain in left knee: Secondary | ICD-10-CM | POA: Diagnosis not present

## 2024-01-24 DIAGNOSIS — M25561 Pain in right knee: Secondary | ICD-10-CM | POA: Diagnosis not present

## 2024-01-24 DIAGNOSIS — Z1231 Encounter for screening mammogram for malignant neoplasm of breast: Secondary | ICD-10-CM

## 2024-01-24 DIAGNOSIS — M6281 Muscle weakness (generalized): Secondary | ICD-10-CM

## 2024-01-24 DIAGNOSIS — G8929 Other chronic pain: Secondary | ICD-10-CM | POA: Diagnosis not present
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
## 2024-01-26 ENCOUNTER — Ambulatory Visit: Admitting: Physical Therapy

## 2024-01-26 DIAGNOSIS — M5459 Other low back pain: Secondary | ICD-10-CM

## 2024-01-26 DIAGNOSIS — M6281 Muscle weakness (generalized): Secondary | ICD-10-CM

## 2024-01-26 DIAGNOSIS — G8929 Other chronic pain: Secondary | ICD-10-CM | POA: Diagnosis not present

## 2024-01-26 DIAGNOSIS — M25561 Pain in right knee: Secondary | ICD-10-CM | POA: Diagnosis not present

## 2024-01-26 DIAGNOSIS — M25562 Pain in left knee: Secondary | ICD-10-CM | POA: Diagnosis not present
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
## 2024-01-27 ENCOUNTER — Other Ambulatory Visit (INDEPENDENT_AMBULATORY_CARE_PROVIDER_SITE_OTHER)

## 2024-01-27 DIAGNOSIS — E785 Hyperlipidemia, unspecified: Secondary | ICD-10-CM

## 2024-01-27 DIAGNOSIS — I10 Essential (primary) hypertension: Secondary | ICD-10-CM

## 2024-01-27 DIAGNOSIS — E559 Vitamin D deficiency, unspecified: Secondary | ICD-10-CM | POA: Diagnosis not present

## 2024-01-27 LAB — LIPID PANEL
Cholesterol: 166 mg/dL (ref 0–200)
HDL: 73.3 mg/dL (ref >39.00)
LDL Cholesterol: 67 mg/dL (ref 0–99)
NonHDL: 92.43
Total CHOL/HDL Ratio: 2
Triglycerides: 126 mg/dL (ref 0.0–149.0)
VLDL: 25.2 mg/dL (ref 0.0–40.0)

## 2024-01-27 LAB — CBC WITH DIFFERENTIAL/PLATELET
Basophils Absolute: 0 K/uL (ref 0.0–0.1)
Basophils Relative: 1.2 % (ref 0.0–3.0)
Eosinophils Absolute: 0.1 K/uL (ref 0.0–0.7)
Eosinophils Relative: 3.4 % (ref 0.0–5.0)
HCT: 33.4 % — ABNORMAL LOW (ref 36.0–46.0)
Hemoglobin: 11.2 g/dL — ABNORMAL LOW (ref 12.0–15.0)
Lymphocytes Relative: 35.9 % (ref 12.0–46.0)
Lymphs Abs: 1 K/uL (ref 0.7–4.0)
MCHC: 33.5 g/dL (ref 30.0–36.0)
MCV: 91.9 fl (ref 78.0–100.0)
Monocytes Absolute: 0.3 K/uL (ref 0.1–1.0)
Monocytes Relative: 10.2 % (ref 3.0–12.0)
Neutro Abs: 1.4 K/uL (ref 1.4–7.7)
Neutrophils Relative %: 49.3 % (ref 43.0–77.0)
Platelets: 180 K/uL (ref 150.0–400.0)
RBC: 3.64 Mil/uL — ABNORMAL LOW (ref 3.87–5.11)
RDW: 14 % (ref 11.5–15.5)
WBC: 2.7 K/uL — ABNORMAL LOW (ref 4.0–10.5)

## 2024-01-27 LAB — VITAMIN D 25 HYDROXY (VIT D DEFICIENCY, FRACTURES): VITD: 28.6 ng/mL — ABNORMAL LOW (ref 30.00–100.00)

## 2024-01-27 LAB — TSH: TSH: 4.48 u[IU]/mL (ref 0.35–5.50)

## 2024-01-27 LAB — VITAMIN B12: Vitamin B-12: 234 pg/mL (ref 211–911)

## 2024-01-28 LAB — COMPREHENSIVE METABOLIC PANEL WITH GFR
ALT: 23 U/L (ref 0–35)
AST: 27 U/L (ref 0–37)
Albumin: 4.7 g/dL (ref 3.5–5.2)
Alkaline Phosphatase: 85 U/L (ref 39–117)
BUN: 20 mg/dL (ref 6–23)
CO2: 24 meq/L (ref 19–32)
Calcium: 9.3 mg/dL (ref 8.4–10.5)
Chloride: 98 meq/L (ref 96–112)
Creatinine, Ser: 1.08 mg/dL (ref 0.40–1.20)
GFR: 49.07 mL/min — ABNORMAL LOW (ref >60.00)
Glucose, Bld: 96 mg/dL (ref 70–99)
Potassium: 4 meq/L (ref 3.5–5.1)
Sodium: 136 meq/L (ref 135–145)
Total Bilirubin: 0.6 mg/dL (ref 0.2–1.2)
Total Protein: 7.3 g/dL (ref 6.0–8.3)

## 2024-01-30 ENCOUNTER — Ambulatory Visit: Payer: Self-pay | Admitting: Family Medicine

## 2024-02-01 ENCOUNTER — Ambulatory Visit: Admitting: Physical Therapy

## 2024-02-03 ENCOUNTER — Encounter: Payer: Self-pay | Admitting: Family Medicine

## 2024-02-03 ENCOUNTER — Encounter: Admitting: Physical Therapy

## 2024-02-03 ENCOUNTER — Ambulatory Visit: Admitting: Family Medicine

## 2024-02-03 VITALS — BP 120/60 | HR 63 | Temp 98.3°F | Ht 63.0 in | Wt 187.0 lb

## 2024-02-03 DIAGNOSIS — D649 Anemia, unspecified: Secondary | ICD-10-CM

## 2024-02-03 DIAGNOSIS — I10 Essential (primary) hypertension: Secondary | ICD-10-CM | POA: Diagnosis not present

## 2024-02-03 DIAGNOSIS — F419 Anxiety disorder, unspecified: Secondary | ICD-10-CM

## 2024-02-03 DIAGNOSIS — E785 Hyperlipidemia, unspecified: Secondary | ICD-10-CM | POA: Diagnosis not present

## 2024-02-03 DIAGNOSIS — E559 Vitamin D deficiency, unspecified: Secondary | ICD-10-CM | POA: Diagnosis not present

## 2024-02-03 DIAGNOSIS — Z0000 Encounter for general adult medical examination without abnormal findings: Secondary | ICD-10-CM

## 2024-02-03 DIAGNOSIS — M2550 Pain in unspecified joint: Secondary | ICD-10-CM

## 2024-02-03 DIAGNOSIS — Z7189 Other specified counseling: Secondary | ICD-10-CM

## 2024-02-03 MED ORDER — ROSUVASTATIN CALCIUM 10 MG PO TABS: 10.0000 mg | ORAL_TABLET | Freq: Every day | ORAL | 3 refills | Status: AC

## 2024-02-03 NOTE — Progress Notes (Signed)
 HGB lower.  No bleeding, no black stools.  Unclear if this is related to relatively low B12 level.  Discussed.  She didn't tolerate trazodone .   Low vit D.  She had been off vit D.   Low vit B12.  She had been off B12.    Hypertension:    Using medication without problems or lightheadedness: yes Chest pain with exertion:no Edema:some, usually using compression stockings.  Had vascular eval.  Short of breath:no Labs d/w pt.    Elevated Cholesterol: Using medications without problems: yes Muscle aches: no Diet compliance: d/w pt.  Exercise: d/w pt.  Labs d/w pt.   Taking diclofenac  for her joint pain and going to therapy.  Pain improved in the meantime.   Anxiety.   Still on lexapro  with prn BZD.  Still with some anxiety and depression but lexapro  helped.  No SI/HI.     Tetanus 2010 Flu 2025 PNA prev done.  Shingles prev done.  Covid d/w pt.   RSV vaccine d/w pt.   Mammogram due later 2025 DXA 2024 Colon cancer screening d/w pt.  Referred 2024 but she had to get rescheduled.   Living will d/w pt. Brother in law Jamie Curry designated if patient were incapacitated.    Meds, vitals, and allergies reviewed.   ROS: Per HPI unless specifically indicated in ROS section   GEN: nad, alert and oriented HEENT: mucous membranes moist NECK: supple w/o LA CV: rrr PULM: ctab, no inc wob ABD: soft, +bs EXT: trace BLE edema SKIN:  well-perfused.

## 2024-02-03 NOTE — Patient Instructions (Addendum)
 Restart B12 and vit D and recheck labs in about 3 months.  Update me as needed.  Please check on seeing GI when possible.  Take care.  Glad to see you.

## 2024-02-06 DIAGNOSIS — M2550 Pain in unspecified joint: Secondary | ICD-10-CM | POA: Insufficient documentation

## 2024-02-06 DIAGNOSIS — D649 Anemia, unspecified: Secondary | ICD-10-CM | POA: Insufficient documentation

## 2024-02-06 NOTE — Assessment & Plan Note (Signed)
  Tetanus 2010 Flu 2025 PNA prev done.  Shingles prev done.  Covid d/w pt.   RSV vaccine d/w pt.   Mammogram due later 2025 DXA 2024 Colon cancer screening d/w pt.  Referred 2024 but she had to get rescheduled.   Living will d/w pt. Brother in law [name] designated if patient were incapacitated.

## 2024-02-06 NOTE — Assessment & Plan Note (Signed)
 Continue work on diet and exercise.  Continue hydrochlorothiazide and amlodipine .

## 2024-02-06 NOTE — Assessment & Plan Note (Signed)
 Improved with combination of diclofenac  and therapy.

## 2024-02-06 NOTE — Assessment & Plan Note (Signed)
 Still on lexapro  with prn BZD.  Still with some anxiety and depression but lexapro  helped.  No SI/HI.   Would continue as is.  Okay for outpatient follow-up.  Update me as needed.

## 2024-02-06 NOTE — Assessment & Plan Note (Signed)
 Discussed diet and exercise.  Continue Crestor  as is.

## 2024-02-06 NOTE — Assessment & Plan Note (Addendum)
 Restart B12 and recheck labs in about 3 months.  I asked her to check on seeing GI when possible.  Discussed.  She can update me as needed about that.

## 2024-02-06 NOTE — Assessment & Plan Note (Signed)
 Restart vit D and recheck labs in about 3 months.

## 2024-02-06 NOTE — Assessment & Plan Note (Signed)
Living will d/w pt. Brother in law Rebecca Eaton designated if patient were incapacitated.

## 2024-02-07 ENCOUNTER — Ambulatory Visit: Admitting: Physical Therapy

## 2024-02-07 ENCOUNTER — Encounter: Payer: Self-pay | Admitting: Physical Therapy

## 2024-02-07 DIAGNOSIS — G8929 Other chronic pain: Secondary | ICD-10-CM | POA: Diagnosis not present

## 2024-02-07 DIAGNOSIS — M6281 Muscle weakness (generalized): Secondary | ICD-10-CM

## 2024-02-07 DIAGNOSIS — M5459 Other low back pain: Secondary | ICD-10-CM | POA: Diagnosis not present

## 2024-02-07 DIAGNOSIS — M25562 Pain in left knee: Secondary | ICD-10-CM | POA: Diagnosis not present

## 2024-02-07 DIAGNOSIS — M25561 Pain in right knee: Secondary | ICD-10-CM | POA: Diagnosis not present
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
## 2024-02-08 ENCOUNTER — Ambulatory Visit: Admitting: Physical Therapy

## 2024-02-09 ENCOUNTER — Encounter: Admitting: Physical Therapy

## 2024-02-10 ENCOUNTER — Ambulatory Visit: Payer: Medicare HMO | Admitting: Dermatology

## 2024-02-10 DIAGNOSIS — W908XXA Exposure to other nonionizing radiation, initial encounter: Secondary | ICD-10-CM

## 2024-02-10 DIAGNOSIS — Z1283 Encounter for screening for malignant neoplasm of skin: Secondary | ICD-10-CM | POA: Diagnosis not present

## 2024-02-10 DIAGNOSIS — I8393 Asymptomatic varicose veins of bilateral lower extremities: Secondary | ICD-10-CM | POA: Diagnosis not present

## 2024-02-10 DIAGNOSIS — Z853 Personal history of malignant neoplasm of breast: Secondary | ICD-10-CM

## 2024-02-10 DIAGNOSIS — L814 Other melanin hyperpigmentation: Secondary | ICD-10-CM | POA: Diagnosis not present

## 2024-02-10 DIAGNOSIS — L820 Inflamed seborrheic keratosis: Secondary | ICD-10-CM

## 2024-02-10 DIAGNOSIS — L570 Actinic keratosis: Secondary | ICD-10-CM | POA: Diagnosis not present

## 2024-02-10 DIAGNOSIS — L821 Other seborrheic keratosis: Secondary | ICD-10-CM | POA: Diagnosis not present

## 2024-02-10 DIAGNOSIS — L578 Other skin changes due to chronic exposure to nonionizing radiation: Secondary | ICD-10-CM | POA: Diagnosis not present

## 2024-02-10 DIAGNOSIS — D1801 Hemangioma of skin and subcutaneous tissue: Secondary | ICD-10-CM

## 2024-02-10 DIAGNOSIS — Z8589 Personal history of malignant neoplasm of other organs and systems: Secondary | ICD-10-CM

## 2024-02-10 DIAGNOSIS — Z86007 Personal history of in-situ neoplasm of skin: Secondary | ICD-10-CM

## 2024-02-10 DIAGNOSIS — D229 Melanocytic nevi, unspecified: Secondary | ICD-10-CM

## 2024-02-10 NOTE — Patient Instructions (Addendum)
 Actinic keratoses are precancerous spots that appear secondary to cumulative UV radiation exposure/sun exposure over time. They are chronic with expected duration over 1 year. A portion of actinic keratoses will progress to squamous cell carcinoma of the skin. It is not possible to reliably predict which spots will progress to skin cancer and so treatment is recommended to prevent development of skin cancer.  Recommend daily broad spectrum sunscreen SPF 30+ to sun-exposed areas, reapply every 2 hours as needed.  Recommend staying in the shade or wearing long sleeves, sun glasses (UVA+UVB protection) and wide brim hats (4-inch brim around the entire circumference of the hat). Call for new or changing lesions.     Cryotherapy Aftercare  Wash gently with soap and water everyday.   Apply Vaseline and Band-Aid daily until healed.    Seborrheic Keratosis  What causes seborrheic keratoses? Seborrheic keratoses are harmless, common skin growths that first appear during adult life.  As time goes by, more growths appear.  Some people may develop a large number of them.  Seborrheic keratoses appear on both covered and uncovered body parts.  They are not caused by sunlight.  The tendency to develop seborrheic keratoses can be inherited.  They vary in color from skin-colored to gray, brown, or even black.  They can be either smooth or have a rough, warty surface.   Seborrheic keratoses are superficial and look as if they were stuck on the skin.  Under the microscope this type of keratosis looks like layers upon layers of skin.  That is why at times the top layer may seem to fall off, but the rest of the growth remains and re-grows.    Treatment Seborrheic keratoses do not need to be treated, but can easily be removed in the office.  Seborrheic keratoses often cause symptoms when they rub on clothing or jewelry.  Lesions can be in the way of shaving.  If they become inflamed, they can cause itching, soreness,  or burning.  Removal of a seborrheic keratosis can be accomplished by freezing, burning, or surgery. If any spot bleeds, scabs, or grows rapidly, please return to have it checked, as these can be an indication of a skin cancer.   Melanoma ABCDEs  Melanoma is the most dangerous type of skin cancer, and is the leading cause of death from skin disease.  You are more likely to develop melanoma if you: Have light-colored skin, light-colored eyes, or red or blond hair Spend a lot of time in the sun Tan regularly, either outdoors or in a tanning bed Have had blistering sunburns, especially during childhood Have a close family member who has had a melanoma Have atypical moles or large birthmarks  Early detection of melanoma is key since treatment is typically straightforward and cure rates are extremely high if we catch it early.   The first sign of melanoma is often a change in a mole or a new dark spot.  The ABCDE system is a way of remembering the signs of melanoma.  A for asymmetry:  The two halves do not match. B for border:  The edges of the growth are irregular. C for color:  A mixture of colors are present instead of an even brown color. D for diameter:  Melanomas are usually (but not always) greater than 6mm - the size of a pencil eraser. E for evolution:  The spot keeps changing in size, shape, and color.  Please check your skin once per month between visits. You can  use a small mirror in front and a large mirror behind you to keep an eye on the back side or your body.   If you see any new or changing lesions before your next follow-up, please call to schedule a visit.  Please continue daily skin protection including broad spectrum sunscreen SPF 30+ to sun-exposed areas, reapplying every 2 hours as needed when you're outdoors.   Staying in the shade or wearing long sleeves, sun glasses (UVA+UVB protection) and wide brim hats (4-inch brim around the entire circumference of the hat) are  also recommended for sun protection.    Due to recent changes in healthcare laws, you may see results of your pathology and/or laboratory studies on MyChart before the doctors have had a chance to review them. We understand that in some cases there may be results that are confusing or concerning to you. Please understand that not all results are received at the same time and often the doctors may need to interpret multiple results in order to provide you with the best plan of care or course of treatment. Therefore, we ask that you please give us  2 business days to thoroughly review all your results before contacting the office for clarification. Should we see a critical lab result, you will be contacted sooner.   If You Need Anything After Your Visit  If you have any questions or concerns for your doctor, please call our main line at [phone] and press option 4 to reach your doctor's medical assistant. If no one answers, please leave a voicemail as directed and we will return your call as soon as possible. Messages left after 4 pm will be answered the following business day.   You may also send us  a message via MyChart. We typically respond to MyChart messages within 1-2 business days.  For prescription refills, please ask your pharmacy to contact our office. Our fax number is [phone].  If you have an urgent issue when the clinic is closed that cannot wait until the next business day, you can page your doctor at the number below.    Please note that while we do our best to be available for urgent issues outside of office hours, we are not available 24/7.   If you have an urgent issue and are unable to reach us , you may choose to seek medical care at your doctor's office, retail clinic, urgent care center, or emergency room.  If you have a medical emergency, please immediately call 911 or go to the emergency department.  Pager Numbers  - Dr. Hester: [phone]  - Dr. Jackquline:  [phone]  - Dr. Claudene: [phone]   - Dr. Raymund: [phone]  In the event of inclement weather, please call our main line at [phone] for an update on the status of any delays or closures.  Dermatology Medication Tips: Please keep the boxes that topical medications come in in order to help keep track of the instructions about where and how to use these. Pharmacies typically print the medication instructions only on the boxes and not directly on the medication tubes.   If your medication is too expensive, please contact our office at [phone] option 4 or send us  a message through MyChart.   We are unable to tell what your co-pay for medications will be in advance as this is different depending on your insurance coverage. However, we may be able to find a substitute medication at lower cost or fill out paperwork to get insurance to cover  a needed medication.   If a prior authorization is required to get your medication covered by your insurance company, please allow us  1-2 business days to complete this process.  Drug prices often vary depending on where the prescription is filled and some pharmacies may offer cheaper prices.  The website www.goodrx.com contains coupons for medications through different pharmacies. The prices here do not account for what the cost may be with help from insurance (it may be cheaper with your insurance), but the website can give you the price if you did not use any insurance.  - You can print the associated coupon and take it with your prescription to the pharmacy.  - You may also stop by our office during regular business hours and pick up a GoodRx coupon card.  - If you need your prescription sent electronically to a different pharmacy, notify our office through [facility] or by phone at [phone] option 4.     Si Usted Necesita Algo Despus de Su Visita  Tambin puede enviarnos un mensaje a travs de Clinical cytogeneticist. Por lo general  respondemos a los mensajes de MyChart en el transcurso de 1 a 2 das hbiles.  Para renovar recetas, por favor pida a su farmacia que se ponga en contacto con nuestra oficina. Randi lakes de fax es [place] [phone].  Si tiene un asunto urgente cuando la clnica est cerrada y que no puede esperar hasta el siguiente da hbil, puede llamar/localizar a su doctor(a) al nmero que aparece a continuacin.   Por favor, tenga en cuenta que aunque hacemos todo lo posible para estar disponibles para asuntos urgentes fuera del horario de [place], no estamos disponibles las 24 horas del da, los 7 [address]  [address] de la [place].   Si tiene un problema urgente y no puede comunicarse con nosotros, puede optar por buscar atencin mdica  en el consultorio de su doctor(a), en una clnica privada, en un centro de atencin urgente o en una sala de emergencias.  Si tiene Engineer, drilling, por favor llame inmediatamente al 911 o vaya a la sala de emergencias.  Nmeros de bper  - Dr. Hester: [phone]  - Dra. Jackquline: [phone]  - Dr. Claudene: [phone]  - Dra. Kitts: [phone]  En caso de inclemencias del [place], por favor llame a nuestra lnea principal al [phone] para una actualizacin sobre el estado de cualquier retraso o cierre.  Consejos para la medicacin en dermatologa: Por favor, guarde las cajas en las que vienen los medicamentos de uso tpico para ayudarle a seguir las instrucciones sobre dnde y cmo usarlos. Las farmacias generalmente imprimen las instrucciones del medicamento slo en las cajas y no directamente en los tubos del [place].   Si su medicamento es muy caro, por favor, pngase en contacto con landry rieger llamando al [phone] y presione la opcin 4 o envenos un mensaje a travs de Clinical cytogeneticist.   No podemos decirle cul ser su copago por los medicamentos por adelantado ya que esto es diferente dependiendo de la cobertura de su seguro. Sin embargo, es posible  que podamos encontrar un medicamento sustituto a Audiological scientist un formulario para que el seguro cubra el medicamento que se considera necesario.   Si se requiere una autorizacin previa para que su compaa de seguros malta su medicamento, por favor permtanos de 1 a 2 das hbiles para completar este proceso.  Los precios de los medicamentos varan con frecuencia dependiendo del Environmental consultant de dnde se surte la receta y iraq  pueden ofrecer precios ms baratos.  El sitio web www.goodrx.com tiene cupones para medicamentos de Health and safety inspector. Los precios aqu no tienen en cuenta lo que podra costar con la ayuda del seguro (puede ser ms barato con su seguro), pero el sitio web puede darle el precio si no utiliz Tourist information centre manager.  - Puede imprimir el cupn correspondiente y llevarlo con su receta a la farmacia.  - Tambin puede pasar por nuestra oficina durante el horario de atencin regular y Education officer, museum una tarjeta de cupones de GoodRx.  - Si necesita que su receta se enve electrnicamente a una farmacia diferente, informe a nuestra oficina a travs de MyChart de [facility] o por telfono llamando al [phone] y presione la opcin 4.

## 2024-02-10 NOTE — Progress Notes (Signed)
 Follow-Up Visit   Subjective  Jamie Curry is a 78 y.o. female who presents for the following: Skin Cancer Screening and Full Body Skin Exam Hx of sccis Hx of aks Hx of isks  The patient presents for Total-Body Skin Exam (TBSE) for skin cancer screening and mole check. The patient has spots, moles and lesions to be evaluated, some may be new or changing and the patient may have concern these could be cancer.   The following portions of the chart were reviewed this encounter and updated as appropriate: medications, allergies, medical history  Review of Systems:  No other skin or systemic complaints except as noted in HPI or Assessment and Plan.  Objective  Well appearing patient in no apparent distress; mood and affect are within normal limits.  A full examination was performed including scalp, head, eyes, ears, nose, lips, neck, chest, axillae, abdomen, back, buttocks, bilateral upper extremities, bilateral lower extremities, hands, feet, fingers, toes, fingernails, and toenails. All findings within normal limits unless otherwise noted below.   Relevant physical exam findings are noted in the Assessment and Plan.  face x 3 (3) Erythematous thin papules/macules with gritty scale.  left temple at hairline x 1, left lateral waistline x 1 (2) Erythematous stuck-on, waxy papule or plaque  Assessment & Plan   HISTORY OF SQUAMOUS CELL CARCINOMA IN SITU OF THE SKIN 12/09/2017 right lateral base of neck  - No evidence of recurrence today - Recommend regular full body skin exams - Recommend daily broad spectrum sunscreen SPF 30+ to sun-exposed areas, reapply every 2 hours as needed.  - Call if any new or changing lesions are noted between office visits    HISTORY OF Breast CANCER  in left  - no lymphadenopathy  - Clear. Observe for recurrence.  - Call clinic for new or changing lesions.   - Recommend regular skin exams, daily broad-spectrum spf 30+ sunscreen use, and  photoprotection.   SKIN CANCER SCREENING PERFORMED TODAY.  ACTINIC DAMAGE - Chronic condition, secondary to cumulative UV/sun exposure - diffuse scaly erythematous macules with underlying dyspigmentation - Recommend daily broad spectrum sunscreen SPF 30+ to sun-exposed areas, reapply every 2 hours as needed.  - Staying in the shade or wearing long sleeves, sun glasses (UVA+UVB protection) and wide brim hats (4-inch brim around the entire circumference of the hat) are also recommended for sun protection.  - Call for new or changing lesions.  LENTIGINES, SEBORRHEIC KERATOSES, HEMANGIOMAS - Benign normal skin lesions - Benign-appearing - Call for any changes  MELANOCYTIC NEVI - Tan-brown and/or pink-flesh-colored symmetric macules and papules - Benign appearing on exam today - Observation - Call clinic for new or changing moles - Recommend daily use of broad spectrum spf 30+ sunscreen to sun-exposed areas.   Varicose Veins/Spider Veins - Dilated blue, purple or red veins at the lower extremities - Reassured - Smaller vessels can be treated by sclerotherapy (a procedure to inject a medicine into the veins to make them disappear) if desired, but the treatment is not covered by insurance. Larger vessels may be covered if symptomatic and we would refer to vascular surgeon if treatment desired.   ACTINIC KERATOSIS (3) face x 3 (3) Actinic keratoses are precancerous spots that appear secondary to cumulative UV radiation exposure/sun exposure over time. They are chronic with expected duration over 1 year. A portion of actinic keratoses will progress to squamous cell carcinoma of the skin. It is not possible to reliably predict which spots will progress to skin  cancer and so treatment is recommended to prevent development of skin cancer.  Recommend daily broad spectrum sunscreen SPF 30+ to sun-exposed areas, reapply every 2 hours as needed.  Recommend staying in the shade or wearing long  sleeves, sun glasses (UVA+UVB protection) and wide brim hats (4-inch brim around the entire circumference of the hat). Call for new or changing lesions. Destruction of lesion - face x 3 (3) Complexity: simple   Destruction method: cryotherapy   Informed consent: discussed and consent obtained   Timeout:  patient name, date of birth, surgical site, and procedure verified Lesion destroyed using liquid nitrogen: Yes   Region frozen until ice ball extended beyond lesion: Yes   Outcome: patient tolerated procedure well with no complications   Post-procedure details: wound care instructions given    INFLAMED SEBORRHEIC KERATOSIS (2) left temple at hairline x 1, left lateral waistline x 1 (2) Symptomatic, irritating, patient would like treated. Destruction of lesion - left temple at hairline x 1, left lateral waistline x 1 (2) Complexity: simple   Destruction method: cryotherapy   Informed consent: discussed and consent obtained   Timeout:  patient name, date of birth, surgical site, and procedure verified Lesion destroyed using liquid nitrogen: Yes   Region frozen until ice ball extended beyond lesion: Yes   Outcome: patient tolerated procedure well with no complications   Post-procedure details: wound care instructions given    Return in about 1 year (around 02/09/2025) for TBSE.  IEleanor Blush, CMA, am acting as scribe for Alm Rhyme, MD.   Documentation: I have reviewed the above documentation for accuracy and completeness, and I agree with the above.  Alm Rhyme, MD

## 2024-02-15 ENCOUNTER — Ambulatory Visit: Attending: Family Medicine | Admitting: Physical Therapy

## 2024-02-15 DIAGNOSIS — M25562 Pain in left knee: Secondary | ICD-10-CM | POA: Insufficient documentation

## 2024-02-15 DIAGNOSIS — G8929 Other chronic pain: Secondary | ICD-10-CM | POA: Insufficient documentation

## 2024-02-15 DIAGNOSIS — M5459 Other low back pain: Secondary | ICD-10-CM | POA: Insufficient documentation

## 2024-02-15 DIAGNOSIS — M25561 Pain in right knee: Secondary | ICD-10-CM | POA: Insufficient documentation

## 2024-02-15 DIAGNOSIS — M6281 Muscle weakness (generalized): Secondary | ICD-10-CM | POA: Diagnosis not present
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
## 2024-02-16 ENCOUNTER — Encounter: Payer: Self-pay | Admitting: Dermatology

## 2024-02-17 ENCOUNTER — Ambulatory Visit: Admitting: Physical Therapy

## 2024-02-22 ENCOUNTER — Encounter: Admitting: Physical Therapy

## 2024-02-24 ENCOUNTER — Encounter: Admitting: Physical Therapy

## 2024-02-28 ENCOUNTER — Other Ambulatory Visit: Payer: Self-pay | Admitting: Family Medicine

## 2024-02-28 DIAGNOSIS — F419 Anxiety disorder, unspecified: Secondary | ICD-10-CM

## 2024-03-01 NOTE — Telephone Encounter (Signed)
 Name of Medication:  Alprazolam  Name of Pharmacy:  [facility] Last Fill or Written Date and Quantity:  02/01/24, #45 Last Office Visit and Type:  02/03/24, annual exam Next Office Visit and Type:  none Last Controlled Substance Agreement Date:  none Last UDS:  none

## 2024-03-07 ENCOUNTER — Ambulatory Visit: Payer: Self-pay

## 2024-03-07 NOTE — Telephone Encounter (Signed)
Spoke with pt relaying Dr. Josefine Class message.  Pt verbalizes understanding.

## 2024-03-07 NOTE — Telephone Encounter (Signed)
 I would cut back to clearly liquids in the meantime if she has not already done that.  Would keep the OV tomorrow with routine ER cautions in the meantime.  Thanks.

## 2024-03-07 NOTE — Telephone Encounter (Signed)
 FYI Only or Action Required?: FYI only for provider: appointment scheduled on 03/08/2024 at 2:00 PM.  Patient was last seen in primary care on 02/03/2024 by Cleatus Arlyss RAMAN, MD.  Called Nurse Triage reporting Abdominal Pain.  Symptoms began yesterday.  Interventions attempted: Rest, hydration, or home remedies.  Symptoms are: unchanged.  Triage Disposition: See Physician Within 24 Hours  Patient/caregiver understands and will follow disposition?: Yes  Copied from CRM #[id number]. Topic: Clinical - Medical Advice >> Mar 07, 2024 11:37 AM Ahlexyia S wrote: Reason for CRM: Pt called in wanting to know how she should move forward with her diverticulitis. Pt isnt sure if she needs to be seen or prescribed medications. Pt also mentioned that she has had sharp pains. Pt denied having any other symptoms at this time. Pt is requesting a callback for this. Informed pt that some will contact her soon and also advised of turn around time for callbacks. Reason for Disposition  [1] MILD pain (e.g., does not interfere with normal activities) AND [2] pain comes and goes (cramps) AND [3] present > 48 hours  (Exception: This same abdominal pain is a chronic symptom recurrent or ongoing AND present > 4 weeks.)  Answer Assessment - Initial Assessment Questions 1. LOCATION: Where does it hurt?      Left lower abdominal area 2. RADIATION: Does the pain shoot anywhere else? (e.g., chest, back)     No radiation 3. ONSET: When did the pain begin? (e.g., minutes, hours or days ago)      Started yesterday 4. SUDDEN: Gradual or sudden onset?     sudden 5. PATTERN Does the pain come and go, or is it constant?     Comes and goes 6. SEVERITY: How bad is the pain?  (e.g., Scale 1-10; mild, moderate, or severe)     Mild 7. RECURRENT SYMPTOM: Have you ever had this type of stomach pain before? If Yes, ask: When was the last time? and What happened that time?      yes 8. CAUSE: What do you think  is causing the stomach pain? (e.g., gallstones, recent abdominal surgery)     Patient is concerned for diverticulitis 9. RELIEVING/AGGRAVATING FACTORS: What makes it better or worse? (e.g., antacids, bending or twisting motion, bowel movement)     Sitting certain ways 10. OTHER SYMPTOMS: Do you have any other symptoms? (e.g., back pain, diarrhea, fever, urination pain, vomiting)       No other symptoms.  Protocols used: Abdominal Pain - Female-A-AH

## 2024-03-08 ENCOUNTER — Encounter: Payer: Self-pay | Admitting: Family Medicine

## 2024-03-08 ENCOUNTER — Telehealth: Payer: Self-pay

## 2024-03-08 ENCOUNTER — Ambulatory Visit: Admitting: Family Medicine

## 2024-03-08 ENCOUNTER — Telehealth: Payer: Self-pay | Admitting: Family Medicine

## 2024-03-08 VITALS — BP 124/66 | HR 74 | Temp 98.4°F | Ht 63.0 in | Wt 185.1 lb

## 2024-03-08 DIAGNOSIS — R1032 Left lower quadrant pain: Secondary | ICD-10-CM | POA: Diagnosis not present

## 2024-03-08 DIAGNOSIS — R109 Unspecified abdominal pain: Secondary | ICD-10-CM | POA: Diagnosis not present

## 2024-03-08 LAB — POC URINALSYSI DIPSTICK (AUTOMATED)
Bilirubin, UA: NEGATIVE
Blood, UA: NEGATIVE
Glucose, UA: NEGATIVE
Leukocytes, UA: NEGATIVE
Nitrite, UA: NEGATIVE
Protein, UA: NEGATIVE
Spec Grav, UA: 1.015 (ref 1.010–1.025)
Urobilinogen, UA: 0.2 U/dL
pH, UA: 6 (ref 5.0–8.0)

## 2024-03-08 MED ORDER — CLARITHROMYCIN 500 MG PO TABS: 500.0000 mg | ORAL_TABLET | Freq: Two times a day (BID) | ORAL | 0 refills | Status: AC

## 2024-03-08 NOTE — Telephone Encounter (Signed)
 I spoke with Jamie Curry at [facility] pharmacy and pt just picked up the abx. Nothing further needed.

## 2024-03-08 NOTE — Telephone Encounter (Signed)
 Copied from CRM #[id number]. Topic: Clinical - Prescription Issue >> Mar 08, 2024  2:38 PM Charolett L wrote: Reason for CRM: Landan from [facility] pharmacy stated that clarithromycin  (BIAXIN ) 500 MG tablet is not covered by her insurance and a script needs to be resent with another antibiotic that does

## 2024-03-08 NOTE — Assessment & Plan Note (Addendum)
 Suspect episode of acute diverticulitis - she is feeling some better after bowel rest for the last 24 hours. Recommend clear liquid diet for next 24-48 hours.  With upcoming long holiday weekend, will provide with antibiotic WASP to start in case symptoms progress despite this. Ok to wait 24 hours prior to starting abx to see if continued improvement on her own.  Multiple drug intolerances - Rx Biaxin  antibiotic which she states she tolerates well despite listed azithromycin/erythromycin allergies.  Red flags to seek ER care also reviewed.  She is overdue for colonoscopy - she plans to call [facility] Dr Sanjuan office to reschedule this after the new year.

## 2024-03-08 NOTE — Telephone Encounter (Signed)
 Called [facility] and the self pay cost of the medication is $40.  Attempted to call patient but there was no answer and no voicemail set up.

## 2024-03-08 NOTE — Telephone Encounter (Signed)
 Due to multiple drug allergies including penicillin, azithromycin, cefuroxime, erythromycin, nitrofurantoin, ofloxacin, tetracycline, and sulfa antibiotics, there is no other better antibiotic alternative recommended at this time.  How much does it cost as self pay?

## 2024-03-08 NOTE — Telephone Encounter (Signed)
 Unable to reach pt by phone. I spoke with Landin at [facility] pharmacy and pt just picked up abx. FYI to Dr KANDICE.

## 2024-03-08 NOTE — Patient Instructions (Addendum)
 Suspicious for diverticulitis.  Labs today Urine looked ok today. Clear liquid diet for next 24-48 hours then slowly advanced diet as tolerated If not improving with this diet change, may fill biaxin  antibiotic sent in today.  If fever, worsening pain, vomiting seek ER care over long weekend.  Let us  know if not improving with treatment.

## 2024-03-08 NOTE — Telephone Encounter (Signed)
 Copied from CRM #[id number]. Topic: Clinical - Prescription Issue >> Mar 08, 2024  2:38 PM Charolett L wrote: Reason for CRM: Landan from [facility] pharmacy stated that clarithromycin  (BIAXIN ) 500 MG tablet is not covered by her insurance and a script needs to be resent with another antibiotic that does >> Mar 08, 2024  4:26 PM Alfonso ORN wrote: Pt called back. Relayed message regarding self pay cost and she is willing to pay. Please send rx to pharmacy.

## 2024-03-08 NOTE — Progress Notes (Signed)
 Ph: [phone] Fax: [phone]   Patient ID: Jamie Curry, female    DOB: 09/04/1945, 78 y.o.   MRN: [id number]  This visit was conducted in person.  BP 124/66   Pulse 74   Temp 98.4 F (36.9 C) (Oral)   Ht 5' 3 (1.6 m)   Wt 185 lb 2 oz (84 kg)   SpO2 96%   BMI 32.79 kg/m    CC: abd pain  Subjective:   HPI: Jamie Curry is a 78 y.o. female presenting on 03/08/2024 for Abdominal Pain (C/o LLQ abd pain and constipation. Described as sharp pain. Sxs started 03/06/24. Denies any N/V/D.  H/o diverticulitis. )   3d h/o sharp LLQ abd pain associated with anorexia and constipation. Some lower abd discomfort with urination. No fevers/chills, blood in stool or urine, dysuria or diarrhea. No significant heartburn.  She hasn't eaten anything since 3pm yesterday. Today has felt better after this.  Decreased appetite noted for the past several months. 7lb weight loss in 4 months.   Multiple drug allergies.  H/o diverticulitis in the past - has previously taken Biaxin  with benefit for this.   Colonoscopy 05/2010 - mod diverticulosis consider rpt 68yrs given fmhx colon cancer in sister [place])  She has had to cancel several planned colonoscopies due to family situations [facility]).      Relevant past medical, surgical, family and social history reviewed and updated as indicated. Interim medical history since our last visit reviewed. Allergies and medications reviewed and updated. Outpatient Medications Prior to Visit  Medication Sig Dispense Refill   acetaminophen  (TYLENOL ) 500 MG tablet Take 1,000 mg by mouth every 8 (eight) hours as needed for mild pain (pain score 1-3).     albuterol (VENTOLIN HFA) 108 (90 Base) MCG/ACT inhaler Inhale into the lungs every 6 (six) hours as needed for wheezing or shortness of breath.     ALPRAZolam  (XANAX ) 0.5 MG tablet TAKE ONE HALF (1/2) TABLET BY MOUTH IN THE MORNING AND ONE TABLET BY MOUTH IN THE EVENING IF NEEDED. SEDATION CAUTION 45  tablet 1   amLODipine  (NORVASC ) 2.5 MG tablet TAKE ONE TABLET (2.5 MG TOTAL) BY MOUTH DAILY. 90 tablet 3   Cholecalciferol (VITAMIN D3) 50 MCG (2000 UT) capsule Take 2 capsules (4,000 Units total) by mouth daily.     cyanocobalamin  (VITAMIN B12) 1000 MCG tablet Take 1 tablet (1,000 mcg total) by mouth daily.     diclofenac  (VOLTAREN ) 50 MG EC tablet Take 1 tablet (50 mg total) by mouth 2 (two) times daily as needed (for pain). With food. 180 tablet 1   escitalopram  (LEXAPRO ) 20 MG tablet TAKE ONE TABLET BY MOUTH ONCE A DAY 90 tablet 1   fluticasone (FLONASE) 50 MCG/ACT nasal spray Place into both nostrils.     hydrochlorothiazide (HYDRODIURIL) 25 MG tablet TAKE ONE TABLET BY MOUTH ONCE A DAY 90 tablet 3   loperamide  (IMODIUM  A-D) 2 MG tablet Take 1 tablet (2 mg total) by mouth 4 (four) times daily as needed for diarrhea or loose stools.     melatonin 5 MG TABS Take 1 tablet (5 mg total) by mouth at bedtime as needed.     montelukast  (SINGULAIR ) 10 MG tablet TAKE ONE TABLET BY MOUTH ONCE A DAY 90 tablet 3   neomycin-polymyxin-hydrocortisone (CORTISPORIN) 3.5-10000-1 OTIC suspension SMARTSIG:In Ear(s)     nystatin  powder APPLY 1 APPLICATION TOPICALLY 3 TIMES DAILY 60 g 3   rosuvastatin  (CRESTOR ) 10 MG tablet Take 1 tablet (10 mg  total) by mouth at bedtime. 90 tablet 3   No facility-administered medications prior to visit.     Per HPI unless specifically indicated in ROS section below Review of Systems  Objective:  BP 124/66   Pulse 74   Temp 98.4 F (36.9 C) (Oral)   Ht 5' 3 (1.6 m)   Wt 185 lb 2 oz (84 kg)   SpO2 96%   BMI 32.79 kg/m   Wt Readings from Last 3 Encounters:  03/08/24 185 lb 2 oz (84 kg)  02/03/24 187 lb (84.8 kg)  12/14/23 192 lb (87.1 kg)      Physical Exam Vitals and nursing note reviewed.  Constitutional:      Appearance: Normal appearance. She is not ill-appearing.  HENT:     Head: Normocephalic and atraumatic.     Mouth/Throat:     Mouth: Mucous  membranes are moist.     Pharynx: Oropharynx is clear. No oropharyngeal exudate or posterior oropharyngeal erythema.  Eyes:     Extraocular Movements: Extraocular movements intact.     Pupils: Pupils are equal, round, and reactive to light.  Cardiovascular:     Rate and Rhythm: Normal rate and regular rhythm.     Pulses: Normal pulses.     Heart sounds: Normal heart sounds. No murmur heard. Pulmonary:     Effort: Pulmonary effort is normal. No respiratory distress.     Breath sounds: Normal breath sounds. No wheezing, rhonchi or rales.  Abdominal:     General: Bowel sounds are normal. There is no distension.     Palpations: Abdomen is soft. There is no mass.     Tenderness: There is abdominal tenderness (mild-mod) in the epigastric area and left lower quadrant. There is no guarding or rebound. Negative signs include Murphy's sign.     Hernia: No hernia is present.     Comments:  No rebound or guarding  Musculoskeletal:     Right lower leg: No edema.     Left lower leg: No edema.  Skin:    General: Skin is warm and dry.     Findings: No rash.  Neurological:     Mental Status: She is alert.  Psychiatric:        Mood and Affect: Mood normal.        Behavior: Behavior normal.       Results for orders placed or performed in visit on 03/08/24  POCT Urinalysis Dipstick (Automated)   Collection Time: 03/08/24  2:11 PM  Result Value Ref Range   Color, UA Yellow    Clarity, UA clear    Glucose, UA Negative Negative   Bilirubin, UA Negative    Ketones, UA trace    Spec Grav, UA 1.015 1.010 - 1.025   Blood, UA Negative    pH, UA 6.0 5.0 - 8.0   Protein, UA Negative Negative   Urobilinogen, UA 0.2 0.2 or 1.0 E.U./dL   Nitrite, UA Negative    Leukocytes, UA Negative Negative   Lab Results  Component Value Date   NA 136 01/27/2024   CL 98 01/27/2024   K 4.0 01/27/2024   CO2 24 01/27/2024   BUN 20 01/27/2024   CREATININE 1.08 01/27/2024   GFR 49.07 (L) 01/27/2024   CALCIUM   9.3 01/27/2024   ALBUMIN 4.7 01/27/2024   GLUCOSE 96 01/27/2024    Assessment & Plan:   Problem List Items Addressed This Visit     Left lower quadrant abdominal pain - Primary  Suspect episode of acute diverticulitis - she is feeling some better after bowel rest for the last 24 hours. Recommend clear liquid diet for next 24-48 hours.  With upcoming long holiday weekend, will provide with antibiotic WASP to start in case symptoms progress despite this. Ok to wait 24 hours prior to starting abx to see if continued improvement on her own.  Multiple drug intolerances - Rx Biaxin  antibiotic which she states she tolerates well despite listed azithromycin/erythromycin allergies.  Red flags to seek ER care also reviewed.  She is overdue for colonoscopy - she plans to call [facility] GI Dr Sanjuan office to reschedule this after the new year.       Relevant Orders   POCT Urinalysis Dipstick (Automated) (Completed)   CBC with Differential/Platelet   Comprehensive metabolic panel with GFR   Lipase   Other Visit Diagnoses       Abdominal pain, unspecified abdominal location       Relevant Orders   Lipase        Meds ordered this encounter  Medications   clarithromycin  (BIAXIN ) 500 MG tablet    Sig: Take 1 tablet (500 mg total) by mouth 2 (two) times daily.    Dispense:  20 tablet    Refill:  0    Orders Placed This Encounter  Procedures   CBC with Differential/Platelet   Comprehensive metabolic panel with GFR   Lipase   POCT Urinalysis Dipstick (Automated)    Patient Instructions  Suspicious for diverticulitis.  Labs today Urine looked ok today. Clear liquid diet for next 24-48 hours then slowly advanced diet as tolerated If not improving with this diet change, may fill biaxin  antibiotic sent in today.  If fever, worsening pain, vomiting seek ER care over long weekend.  Let us  know if not improving with treatment.   Follow up plan: No follow-ups on file.  Anton Blas,  MD

## 2024-03-09 LAB — CBC WITH DIFFERENTIAL/PLATELET
Absolute Lymphocytes: 1258 {cells}/uL (ref 850–3900)
Absolute Monocytes: 431 {cells}/uL (ref 200–950)
Basophils Absolute: 31 {cells}/uL (ref 0–200)
Basophils Relative: 0.7 %
Eosinophils Absolute: 79 {cells}/uL (ref 15–500)
Eosinophils Relative: 1.8 %
HCT: 31.7 % — ABNORMAL LOW (ref 35.9–46.0)
Hemoglobin: 10.6 g/dL — ABNORMAL LOW (ref 11.7–15.5)
MCH: 30.7 pg (ref 27.0–33.0)
MCHC: 33.4 g/dL (ref 31.6–35.4)
MCV: 91.9 fL (ref 81.4–101.7)
MPV: 11.9 fL (ref 7.5–12.5)
Monocytes Relative: 9.8 %
Neutro Abs: 2600 {cells}/uL (ref 1500–7800)
Neutrophils Relative %: 59.1 %
Platelets: 190 Thousand/uL (ref 140–400)
RBC: 3.45 Million/uL — ABNORMAL LOW (ref 3.80–5.10)
RDW: 13.2 % (ref 11.0–15.0)
Total Lymphocyte: 28.6 %
WBC: 4.4 Thousand/uL (ref 3.8–10.8)

## 2024-03-09 LAB — COMPREHENSIVE METABOLIC PANEL WITH GFR
AG Ratio: 1.8 (calc) (ref 1.0–2.5)
ALT: 18 U/L (ref 6–29)
AST: 21 U/L (ref 10–35)
Albumin: 4.5 g/dL (ref 3.6–5.1)
Alkaline phosphatase (APISO): 90 U/L (ref 37–153)
BUN: 13 mg/dL (ref 7–25)
CO2: 27 mmol/L (ref 20–32)
Calcium: 9.2 mg/dL (ref 8.6–10.4)
Chloride: 93 mmol/L — ABNORMAL LOW (ref 98–110)
Creat: 0.96 mg/dL (ref 0.60–1.00)
Globulin: 2.5 g/dL (ref 1.9–3.7)
Glucose, Bld: 97 mg/dL (ref 65–99)
Potassium: 3.8 mmol/L (ref 3.5–5.3)
Sodium: 131 mmol/L — ABNORMAL LOW (ref 135–146)
Total Bilirubin: 1.3 mg/dL — ABNORMAL HIGH (ref 0.2–1.2)
Total Protein: 7 g/dL (ref 6.1–8.1)
eGFR: 60 mL/min/1.73m2 (ref >=60)

## 2024-03-09 LAB — LIPASE: Lipase: 14 U/L (ref 7–60)

## 2024-03-11 ENCOUNTER — Ambulatory Visit: Payer: Self-pay | Admitting: Family Medicine

## 2024-03-11 DIAGNOSIS — D649 Anemia, unspecified: Secondary | ICD-10-CM

## 2024-03-11 DIAGNOSIS — R1032 Left lower quadrant pain: Secondary | ICD-10-CM

## 2024-03-14 DIAGNOSIS — H6123 Impacted cerumen, bilateral: Secondary | ICD-10-CM | POA: Diagnosis not present

## 2024-03-14 DIAGNOSIS — H903 Sensorineural hearing loss, bilateral: Secondary | ICD-10-CM | POA: Diagnosis not present

## 2024-03-14 DIAGNOSIS — H60543 Acute eczematoid otitis externa, bilateral: Secondary | ICD-10-CM | POA: Diagnosis not present

## 2024-03-15 DIAGNOSIS — K5900 Constipation, unspecified: Secondary | ICD-10-CM | POA: Diagnosis not present

## 2024-03-15 DIAGNOSIS — Z1211 Encounter for screening for malignant neoplasm of colon: Secondary | ICD-10-CM | POA: Diagnosis not present

## 2024-03-20 ENCOUNTER — Encounter

## 2024-03-22 ENCOUNTER — Ambulatory Visit: Admission: RE | Admit: 2024-03-22 | Admitting: Internal Medicine

## 2024-03-22 ENCOUNTER — Encounter: Admission: RE | Payer: Self-pay

## 2024-03-22 SURGERY — COLONOSCOPY
Anesthesia: General

## 2024-03-28 ENCOUNTER — Inpatient Hospital Stay: Admission: RE | Admit: 2024-03-28 | Discharge: 2024-03-28 | Attending: Oncology

## 2024-03-28 DIAGNOSIS — Z1231 Encounter for screening mammogram for malignant neoplasm of breast: Secondary | ICD-10-CM

## 2024-03-29 ENCOUNTER — Telehealth: Payer: Self-pay | Admitting: Family Medicine

## 2024-03-29 NOTE — Telephone Encounter (Signed)
 Noted.   Please let me know if I can be of service in the meantime.  Thanks.

## 2024-03-29 NOTE — Telephone Encounter (Signed)
 Pt had to cancel colonoscopy due vomiting and not tolerating the medication. Patient states she is not going to reschedule until she feels better.

## 2024-03-29 NOTE — Telephone Encounter (Signed)
 Please call pt.  She saw [facility] [place] on 12/3, scheduled colonoscopy for 12/10 but this was cancelled.  Please see about when she is going to get the colonoscopy done. Thanks.

## 2024-03-30 ENCOUNTER — Other Ambulatory Visit: Payer: Self-pay | Admitting: Family Medicine

## 2024-03-30 DIAGNOSIS — M549 Dorsalgia, unspecified: Secondary | ICD-10-CM

## 2024-04-03 NOTE — Telephone Encounter (Signed)
 Voltaren  tab Last filled:  01/03/24, #180 Last OV:  02/03/24, annual exam Next OV:  none

## 2024-05-01 ENCOUNTER — Other Ambulatory Visit: Payer: Self-pay | Admitting: Family Medicine

## 2024-05-01 DIAGNOSIS — F419 Anxiety disorder, unspecified: Secondary | ICD-10-CM

## 2024-05-01 NOTE — Telephone Encounter (Signed)
 Name of Medication:  Alprazolam  Name of Pharmacy:  [facility] Last Fill or Written Date and Quantity:  04/01/24, #45 Last Office Visit and Type:  02/03/24, annual exam Next Office Visit and Type:  none Last Controlled Substance Agreement Date:  none Last UDS:  none

## 2024-05-03 ENCOUNTER — Encounter: Payer: Self-pay | Admitting: Orthopaedic Surgery

## 2024-05-03 ENCOUNTER — Ambulatory Visit: Admitting: Orthopaedic Surgery

## 2024-05-03 DIAGNOSIS — M25562 Pain in left knee: Secondary | ICD-10-CM

## 2024-05-03 DIAGNOSIS — M25561 Pain in right knee: Secondary | ICD-10-CM | POA: Diagnosis not present

## 2024-05-03 DIAGNOSIS — G8929 Other chronic pain: Secondary | ICD-10-CM

## 2024-05-03 MED ORDER — METHYLPREDNISOLONE ACETATE 40 MG/ML IJ SUSP
40.0000 mg | INTRAMUSCULAR | Status: AC | PRN
  Administered 2024-05-03: 40 mg via INTRA_ARTICULAR

## 2024-05-03 MED ORDER — LIDOCAINE HCL 1 % IJ SOLN
3.0000 mL | INTRAMUSCULAR | Status: AC | PRN
  Administered 2024-05-03: 3 mL

## 2024-05-03 NOTE — Progress Notes (Signed)
 The patient is a 79 year old female that we have not seen for a long period of time.  She comes in today with bilateral knee pain and says the left knee is little bit worse and it is worse with walking.  She has had hyaluronic acid injections in the remote past.  Is been a while since she has had any type of injections.  She does take diclofenac  for pain and said some lower back pain as well which she said her primary care doctor said is arthritic.  She is here today for us  to see her for her knees.  She denies any acute change in her medical status.  On exam both knees are cool.  Both knees have good range of motion but patellofemoral crepitation and medial joint line tenderness and just some global tenderness but no instability on exam.  Per her request today placed a steroid injection in both knees today and I think this is a good idea to try these injections.  If this does not help or she would like to consider hyaluronic acid or gel shots again she will give us  a call.  All questions concerns were answered and addressed.  Follow-up for now is as needed.    Procedure Note  Patient: Jamie Curry             Date of Birth: 09-Jun-1945           MRN: [id number]             Visit Date: 05/03/2024  Procedures: Visit Diagnoses:  1. Chronic pain of left knee   2. Chronic pain of right knee     Large Joint Inj: R knee on 05/03/2024 9:51 AM Indications: diagnostic evaluation and pain Details: 22 G 1.5 in needle, superolateral approach  Arthrogram: No  Medications: 3 mL lidocaine  1 %; 40 mg methylPREDNISolone  acetate 40 MG/ML Outcome: tolerated well, no immediate complications Procedure, treatment alternatives, risks and benefits explained, specific risks discussed. Consent was given by the patient. Immediately prior to procedure a time out was called to verify the correct patient, procedure, equipment, support staff and site/side marked as required. Patient was prepped and draped in the usual  sterile fashion.    Large Joint Inj: L knee on 05/03/2024 9:51 AM Indications: diagnostic evaluation and pain Details: 22 G 1.5 in needle, superolateral approach  Arthrogram: No  Medications: 3 mL lidocaine  1 %; 40 mg methylPREDNISolone  acetate 40 MG/ML Outcome: tolerated well, no immediate complications Procedure, treatment alternatives, risks and benefits explained, specific risks discussed. Consent was given by the patient. Immediately prior to procedure a time out was called to verify the correct patient, procedure, equipment, support staff and site/side marked as required. Patient was prepped and draped in the usual sterile fashion.

## 2024-05-05 ENCOUNTER — Other Ambulatory Visit (INDEPENDENT_AMBULATORY_CARE_PROVIDER_SITE_OTHER)

## 2024-05-05 DIAGNOSIS — D649 Anemia, unspecified: Secondary | ICD-10-CM

## 2024-05-05 DIAGNOSIS — E559 Vitamin D deficiency, unspecified: Secondary | ICD-10-CM

## 2024-05-05 LAB — VITAMIN D 25 HYDROXY (VIT D DEFICIENCY, FRACTURES): VITD: 37.58 ng/mL (ref 30.00–100.00)

## 2024-05-05 LAB — CBC WITH DIFFERENTIAL/PLATELET
Basophils Absolute: 0 K/uL (ref 0.0–0.1)
Basophils Relative: 0.7 % (ref 0.0–3.0)
Eosinophils Absolute: 0 K/uL (ref 0.0–0.7)
Eosinophils Relative: 0.5 % (ref 0.0–5.0)
HCT: 33.2 % — ABNORMAL LOW (ref 36.0–46.0)
Hemoglobin: 11.2 g/dL — ABNORMAL LOW (ref 12.0–15.0)
Lymphocytes Relative: 20 % (ref 12.0–46.0)
Lymphs Abs: 0.9 K/uL (ref 0.7–4.0)
MCHC: 33.6 g/dL (ref 30.0–36.0)
MCV: 90.2 fl (ref 78.0–100.0)
Monocytes Absolute: 0.2 K/uL (ref 0.1–1.0)
Monocytes Relative: 5.3 % (ref 3.0–12.0)
Neutro Abs: 3.3 K/uL (ref 1.4–7.7)
Neutrophils Relative %: 73.5 % (ref 43.0–77.0)
Platelets: 221 K/uL (ref 150.0–400.0)
RBC: 3.68 Mil/uL — ABNORMAL LOW (ref 3.87–5.11)
RDW: 14.9 % (ref 11.5–15.5)
WBC: 4.5 K/uL (ref 4.0–10.5)

## 2024-05-05 LAB — FERRITIN: Ferritin: 156.2 ng/mL (ref 10.0–291.0)

## 2024-05-05 LAB — VITAMIN B12: Vitamin B-12: 318 pg/mL (ref 211–911)

## 2024-05-11 ENCOUNTER — Ambulatory Visit: Payer: Self-pay | Admitting: Family Medicine

## 2024-05-11 DIAGNOSIS — D649 Anemia, unspecified: Secondary | ICD-10-CM

## 2024-05-18 NOTE — Telephone Encounter (Signed)
 Pt scheduled for a lab appt 06/05/24.  Need to call pt and ask her to call GI to schedule colonoscopy.

## 2024-05-24 ENCOUNTER — Ambulatory Visit: Admitting: Dermatology

## 2024-06-05 ENCOUNTER — Other Ambulatory Visit

## 2024-07-07 ENCOUNTER — Ambulatory Visit: Admitting: Oncology

## 2024-12-14 ENCOUNTER — Ambulatory Visit

## 2024-12-15 ENCOUNTER — Ambulatory Visit

## 2025-02-08 ENCOUNTER — Ambulatory Visit: Admitting: Dermatology
